# Patient Record
Sex: Female | Born: 1960
Health system: Southern US, Community
[De-identification: ages and names within clinical notes are randomized; demographics above are authoritative.]

## PROBLEM LIST (undated history)

## (undated) DIAGNOSIS — Z789 Other specified health status: Secondary | ICD-10-CM

## (undated) DIAGNOSIS — G510 Bell's palsy: Secondary | ICD-10-CM

## (undated) DIAGNOSIS — E785 Hyperlipidemia, unspecified: Secondary | ICD-10-CM

## (undated) DIAGNOSIS — E119 Type 2 diabetes mellitus without complications: Secondary | ICD-10-CM

## (undated) HISTORY — DX: Type 2 diabetes mellitus without complications: E11.9

## (undated) HISTORY — DX: Hyperlipidemia, unspecified: E78.5

---

## 1993-01-17 HISTORY — PX: TUBAL LIGATION: SHX77

## 2004-05-21 ENCOUNTER — Ambulatory Visit: Payer: Self-pay | Admitting: Internal Medicine

## 2004-06-29 ENCOUNTER — Ambulatory Visit: Payer: Self-pay | Admitting: Internal Medicine

## 2004-07-14 ENCOUNTER — Ambulatory Visit: Payer: Self-pay | Admitting: Internal Medicine

## 2004-07-15 DIAGNOSIS — E785 Hyperlipidemia, unspecified: Secondary | ICD-10-CM

## 2004-07-16 ENCOUNTER — Ambulatory Visit: Payer: Self-pay | Admitting: Internal Medicine

## 2004-08-30 ENCOUNTER — Ambulatory Visit: Payer: Self-pay | Admitting: Internal Medicine

## 2004-09-29 ENCOUNTER — Ambulatory Visit (HOSPITAL_COMMUNITY): Admission: RE | Admit: 2004-09-29 | Discharge: 2004-09-29 | Payer: Self-pay | Admitting: Internal Medicine

## 2004-09-29 ENCOUNTER — Ambulatory Visit: Payer: Self-pay | Admitting: Internal Medicine

## 2004-10-12 ENCOUNTER — Ambulatory Visit: Payer: Self-pay

## 2004-12-17 ENCOUNTER — Ambulatory Visit (HOSPITAL_COMMUNITY): Admission: RE | Admit: 2004-12-17 | Discharge: 2004-12-17 | Payer: Self-pay | Admitting: Internal Medicine

## 2004-12-17 ENCOUNTER — Ambulatory Visit: Payer: Self-pay

## 2004-12-31 ENCOUNTER — Encounter: Admission: RE | Admit: 2004-12-31 | Discharge: 2004-12-31 | Payer: Self-pay | Admitting: Internal Medicine

## 2004-12-31 ENCOUNTER — Encounter (INDEPENDENT_AMBULATORY_CARE_PROVIDER_SITE_OTHER): Payer: Self-pay | Admitting: Internal Medicine

## 2005-01-07 ENCOUNTER — Ambulatory Visit: Payer: Self-pay | Admitting: Internal Medicine

## 2005-11-04 DIAGNOSIS — K219 Gastro-esophageal reflux disease without esophagitis: Secondary | ICD-10-CM | POA: Insufficient documentation

## 2008-09-09 ENCOUNTER — Ambulatory Visit (HOSPITAL_COMMUNITY): Admission: RE | Admit: 2008-09-09 | Discharge: 2008-09-09 | Payer: Self-pay | Admitting: *Deleted

## 2009-08-03 ENCOUNTER — Ambulatory Visit: Payer: Self-pay | Admitting: Family Medicine

## 2009-08-21 ENCOUNTER — Ambulatory Visit: Payer: Self-pay | Admitting: Family Medicine

## 2009-08-21 ENCOUNTER — Encounter: Payer: Self-pay | Admitting: Family Medicine

## 2009-08-21 LAB — CONVERTED CEMR LAB
ALT: 21 units/L (ref 0–35)
AST: 19 units/L (ref 0–37)
Albumin: 4.4 g/dL (ref 3.5–5.2)
Alkaline Phosphatase: 65 units/L (ref 39–117)
BUN: 8 mg/dL (ref 6–23)
Calcium: 9.2 mg/dL (ref 8.4–10.5)
Chloride: 105 meq/L (ref 96–112)
Cholesterol, target level: 200 mg/dL
HDL goal, serum: 40 mg/dL
HDL: 43 mg/dL (ref >39)
Hemoglobin: 12.9 g/dL (ref 12.0–15.0)
LDL Cholesterol: 157 mg/dL — ABNORMAL HIGH (ref 0–99)
LDL Goal: 160 mg/dL
MCHC: 32.2 g/dL (ref 30.0–36.0)
Platelets: 286 10*3/uL (ref 150–400)
Potassium: 4 meq/L (ref 3.5–5.3)
RDW: 13.4 % (ref 11.5–15.5)
Sodium: 139 meq/L (ref 135–145)
TSH: 2.182 microintl units/mL (ref 0.350–4.500)
Total Protein: 6.6 g/dL (ref 6.0–8.3)

## 2009-08-27 ENCOUNTER — Ambulatory Visit: Payer: Self-pay | Admitting: Family Medicine

## 2009-08-27 DIAGNOSIS — IMO0001 Reserved for inherently not codable concepts without codable children: Secondary | ICD-10-CM

## 2009-09-15 ENCOUNTER — Ambulatory Visit (HOSPITAL_COMMUNITY): Admission: RE | Admit: 2009-09-15 | Discharge: 2009-09-15 | Payer: Self-pay | Admitting: Family Medicine

## 2010-02-16 NOTE — Assessment & Plan Note (Signed)
Summary: F/U VISIT/[facility]   Vital Signs:  Patient profile:   50 year old female Height:      60 inches Weight:      189.7 pounds BMI:     37.18 Temp:     98.5 degrees F oral Pulse rate:   75 / minute BP sitting:   119 / 75  (left arm) Cuff size:   regular  Vitals Entered By: Garen Grams LPN (August 27, 2009 1:37 PM) CC: f/u Is Patient Diabetic? No Pain Assessment Patient in pain? no        Primary Provider:  Priscella Mann MD  CC:  f/u.  History of Present Illness: 1. Muscle aches.  Not worse than before. 4/10 at worst. Ibuprofen helps. Diffuse pain, tingling, decreased sensation, warmth right half of her body. Comes and goes randomly. Denies dizziness, joint swelling, leg swelling. More uncomfortable when sitting still; less noticeable when moving around. Uncomfortable when sleeping sometimes but sleeping 7 hrs and feels rested in morning.   2. Hyperchoelsterolemia. Discussed lab results with patient. Only abnormality high cholesterol. Patient exercise 30 minutes daily (walking). Does not eat fried foods, sweets, or carbohydrates other than corn tortillas (3-4 each meal). Eats meat occasionally. Drinks water and sweet tea. Only eats 1 serving of fruit and vegetables a day.   Allergies: No Known Drug Allergies  Physical Exam  General:  alert, well-developed, well-nourished, and well-hydrated.   Head:  normocephalic and atraumatic.  non tender Eyes:  no papilledema. EOMI.  Mouth:  no sores, cavities, erythema Neck:  supple, full ROM, and no masses.   Lungs:  CTAB Heart:  normal rate, regular rhythm, no murmur, no gallop, and no rub.   Abdomen:  obese, NABS, soft, non-tender, non-distended Msk:  no tenderness no joint swelling, warmth, instability R side slightly warmer than left Pulses:  2+ DP pulses Extremities:  1+ pedal edema Neurologic:  cranial nerves II-XII intact, strength normal in all extremities, sensation intact to light touch, and gait normal.   Skin:   warm, dry Cervical Nodes:  no LAD Psych:  good eye contact, not anxious appearing, does not appear depressed, normally interactive, good memory   Impression & Recommendations:  Problem # 1:  FIBROMYALGIA (ICD-729.1)  Informed patient of diagnosis. Told her nothing worrisome. Encouraged her to exercise more: walk more, swim, try exercise class, dance. She said she would try.  Told her to take Tylenol as needed. She was fine with explanation and treatment plan.  She will follow-up in 2 months.   Orders: [facility]- Est Level  3 (16109)  Problem # 2:  DYSLIPIDEMIA (ICD-272.4)  Will not treat for now. Not over 160s and with no other comorbidities. Recommended eat at last 5 fruits and vegetables daily, exercise more, and to drink at least 8 glasses water daily (decrease sweet tea).   Orders: [facility]- Est Level  3 (60454)  Problem # 3:  PREVENTIVE HEALTH CARE (ICD-V70.0) Scheduled for mammogram August 20th.   Complete Medication List: 1)  Omeprazole 20 Mg Cpdr (Omeprazole) .... Take 1 cap by mouth every 12 hours   Patient Instructions: 1)  Por su dolores de cuerpo, pienso que tiene un enfermedad se llama "fibromyalgias". Trata hacer mas ejercicios y mover mas. Tome Tylenol 1-2 tabletas cada seis horas por la dolor.  2)  Por su colesterol, por favor coma mas vegetales y frutas (a menos cinco cada dia). El fibre en estos ayudan las sintomas de estrenimiento tambien. Tambien por favor beba a menos  de ocho vasos de agua cada dia.  3)  Si las sintomas hacen mas peor o tiene otras sintomas ocupadas, por favor llame la clinica. Si toda esta bien, por favor, vuelva a la [facility].  4)  Me alegro que conocerla a usted.

## 2010-02-16 NOTE — Assessment & Plan Note (Signed)
Summary: F/U/[facility](resch'd from 8/2)[facility]   History of Present Illness: Since she does not have any acute complaints, OV re-scheduled with me for next week (Thursday afternoon) when her lab results will be in. Her labs were drawn today.    Allergies: No Known Drug Allergies   Complete Medication List: 1)  Omeprazole 20 Mg Cpdr (Omeprazole) .... Take 1 cap by mouth every 12 hours  Laboratory Results   Blood Tests   Date/Time Received: August 21, 2009 1:43 AM  Date/Time Reported: August 21, 2009 12:03 PM   SED rate: 15 mm/hr  Comments: ...............test performed by......Marland KitchenBonnie A. [place], MLS (ASCP)cm      Appended Document: Orders Update    Clinical Lists Changes  Orders: Added new Service order of No Charge Patient Arrived (NCPA0) (NCPA0) - Signed

## 2010-02-16 NOTE — Assessment & Plan Note (Signed)
Summary: NP,tcb   Vital Signs:  Patient profile:   50 year old female Height:      60 inches Weight:      187.5 pounds BMI:     36.75 Temp:     97.4 degrees F oral Pulse rate:   75 / minute BP sitting:   123 / 65  (left arm) Cuff size:   regular  Vitals Entered By: Garen Grams LPN (August 03, 2009 3:22 PM) CC: New Patient Is Patient Diabetic? No Pain Assessment Patient in pain? no        CC:  New Patient.  History of Present Illness: Pain in the arms, abdomen, legs, and feet, in the joints and the muscles. The pain in the legs, in the joints behind the knees, is the worse. Started a long time ago, about 3 years ago. Came today because sometimes the pain is unbearable. The pain is worse. 5/10 pain. Sometimes constant and sometimes comes and goes.  Associated with food. Pain is better now that stopped eating fat. Eating salad also makes pain better. Sometimes takes Naproxen for pain and helps a little bit. Pain is 3/10. Pain improves with activity. Pain is worse at night and persists when waking up. Improves with hot shower. Mother had similar presentation, helped with physical therapy and medicine.  Never tested for diabetes or cholesterol.  Decreased energy.   Preventive Screening-Counseling & Management  Alcohol-Tobacco     Smoking Status: never  Allergies: No Known Drug Allergies  Past History:  Past Medical History: GERD  Past Surgical History: None.  Family History: Mom: diabetes Dad: diabetes; died from diabetes and cirrhosis. 1 sibling with diabetes.   Social History: Separated from husband. 3 children. Never smoked. Smoking Status:  never  Review of Systems       Diffuse joint and muscle pain. Weighted 178lbs before. Diarrhea sometimes. BM 5-6 times a day but normal stools. This started 1 month ago.  Denies dizziness, headaches.   Physical Exam  General:  alert and well-developed.  alert and well-developed.   Neck:  supple.   Lungs:  normal  respiratory effort.   Heart:  normal rate, regular rhythm, and no murmur.  Abdomen:  soft, non-tender, normal bowel sounds, no guarding, no rigidity, and no rebound tenderness; some tenderness to deep palpation in RLQ. Msk:  normal ROM, no joint tenderness, no joint swelling, no joint warmth, no redness over joints, no joint deformities, and no joint instability.   5+ strength arms and legs.  2+ popliteal and brachial DTRs bilaterally. Sensation intact in lower extremities bilaterally. Extremities:  No pedal edema bilaterally.   Impression & Recommendations:  Problem # 1:  OTHER MALAISE AND FATIGUE (ICD-780.79) The patient presents with many vague symptoms of diffuse joint and muscle pain, abdominal pain, and weakness. Since these problems are not debilitating and the patient has been functioning 3 years with these symptoms, I wanted her to follow-up within a week and have several lab tests drawn so I could rule out a few etiologies. I would like her to return fasting and check a lipid panel, a CMP, CBC, ESR to assess for possible rheumatoid arthritis, and a TSH.  The patient was amenable to holding off on any treatment for the pain until these lab results were back.   Future Orders: Comp Met-[facility] [id number]) ... 07/22/2010 CBC-[facility] ([id number]) ... 07/29/2010 Sed Rate (ESR)-[facility] [id number]) ... 07/22/2010 TSH-[facility] [id number]) ... 07/23/2010  Complete Medication List: 1)  Omeprazole 20 Mg Cpdr (Omeprazole) .Marland KitchenMarland KitchenMarland Kitchen  Take 1 cap by mouth every 12 hours  Other Orders: Future Orders: Lipid-[facility] ([id number]) ... 07/30/2010  Patient Instructions: 1)  Por favor hace una cita en una semana. En la proxima cita, vamos a sacar analisis de sangre para Archivist, cholesterol, y otras cosas. El dia antes de la proxima cita, por favor llame la oficina y le dice la oficina que usted viene la proxima dia. Porque la oficina necesita saber que usted tiene que Chiropractor. Y el dia de la cita, viene en ayunas.   2)  Elfredia Nevins ver si tiene diabetes, anemia, artritis, o otro problema antes de dar medicina.  3)  Llame la oficina si tiene mas preguntas o el dolor es peor.

## 2010-02-16 NOTE — Miscellaneous (Signed)
  Clinical Lists Changes       History of Present Illness: Last Pap 2011 Last mammogram 2010   Family History: Mom: diabetes Dad: diabetes; died from diabetes and cirrhosis @ age 49. 1 sibling with diabetes.   Social History: Separated from husband. 3 children; lives with one Rushie Goltz.  Works in [employer]. Likes to walk and read.  Never smoked.    History of Present Illness: Last Pap 2011 Last mammogram 2010       Allergies: No Known Drug Allergies

## 2010-02-23 ENCOUNTER — Other Ambulatory Visit: Payer: Self-pay | Admitting: Family Medicine

## 2010-02-23 ENCOUNTER — Encounter: Payer: Self-pay | Admitting: Family Medicine

## 2010-02-23 ENCOUNTER — Ambulatory Visit (INDEPENDENT_AMBULATORY_CARE_PROVIDER_SITE_OTHER): Payer: Self-pay | Admitting: Family Medicine

## 2010-02-23 DIAGNOSIS — R1011 Right upper quadrant pain: Secondary | ICD-10-CM

## 2010-02-23 LAB — CONVERTED CEMR LAB: Beta hcg, urine, semiquantitative: NEGATIVE

## 2010-02-24 ENCOUNTER — Ambulatory Visit (HOSPITAL_COMMUNITY)
Admission: RE | Admit: 2010-02-24 | Discharge: 2010-02-24 | Disposition: A | Discharge status: Home / Self Care | Payer: Self-pay | Source: Ambulatory Visit | Attending: Family Medicine | Admitting: Family Medicine

## 2010-02-24 DIAGNOSIS — K7689 Other specified diseases of liver: Secondary | ICD-10-CM | POA: Insufficient documentation

## 2010-02-24 DIAGNOSIS — R1011 Right upper quadrant pain: Secondary | ICD-10-CM | POA: Insufficient documentation

## 2010-02-24 LAB — CONVERTED CEMR LAB
ALT: 18 units/L (ref 0–35)
AST: 19 units/L (ref 0–37)
Albumin: 4.4 g/dL (ref 3.5–5.2)
Alkaline Phosphatase: 77 units/L (ref 39–117)
BUN: 11 mg/dL (ref 6–23)
Chloride: 104 meq/L (ref 96–112)
HCT: 39.8 % (ref 36.0–46.0)
Hemoglobin: 13.1 g/dL (ref 12.0–15.0)
MCHC: 32.9 g/dL (ref 30.0–36.0)
Platelets: 308 10*3/uL (ref 150–400)
Potassium: 4 meq/L (ref 3.5–5.3)
RDW: 13.4 % (ref 11.5–15.5)

## 2010-03-02 ENCOUNTER — Encounter: Payer: Self-pay | Admitting: *Deleted

## 2010-03-04 NOTE — Assessment & Plan Note (Signed)
Summary: Acute RUQ abdominal pain   Vital Signs:  Patient profile:   50 year old female Height:      60 inches Weight:      184.06 pounds BMI:     36.08 BSA:     1.80 Temp:     98.6 degrees F Pulse rate:   72 / minute BP sitting:   110 / 70  Vitals Entered By: Jone Baseman CMA (February 23, 2010 3:40 PM) CC: stomach pain x 1 day Is Patient Diabetic? No Pain Assessment Patient in pain? yes     Location: stomach Intensity: 5   Visit Type:  Acute Primary Provider:  Priscella Mann MD  CC:  stomach pain x 1 day.  History of Present Illness: 1. R-sided abdominal pain started yesterday Pain is 5/10. Comes and goes. May be associated with eating (eating may make it worse). Not associated nausea/vomiting. Pain described as sharp and sometimes tingling lasting for a few minutes. Feels pain is more superficial than deep. Wonders if could be due to muscle strain; has been going to the gym more frequently past few days. Moving a certain way may make it worse.  ROS: no dysuria/hematuria/diarrhea/constipation (bowel movement this AM but hard stools)/blood in stool/fever; denies chest pain but occasional substernal burning from heartburn  Patient takes omeprazole occasionally for GERD. Some improvement. Only takes with symptoms after eating spicy foods.   Habits & Providers  Alcohol-Tobacco-Diet     Tobacco Status: never  Current Medications (verified): 1)  Omeprazole 20 Mg Cpdr (Omeprazole) .... Take 1 Cap By Mouth Every 12 Hours 2)  Diclofenac Potassium 50 Mg Tabs (Diclofenac Potassium) .... [employer] [place]. 3)  Miralax  Powd (Polyethylene Glycol 3350) .... Take Stool Softener 1 Cap Once or Twice A Day As Needed For Constipation.  Allergies (verified): No Known Drug Allergies  Social History: Lives with husband Emilio Aspen who is also my patient.  Had 3 children; lives with one Rushie Goltz.  Works in [employer]. Likes to  walk and read.  Never smoked.   Physical Exam  General:  NAD, conversant, pleasant, tidy Lungs:  CTAB Heart:  RRR, no murmurs Abdomen:  NABS, soft, obese, moderate tenderness to deep palpation middle right abdomen; no rebound or guarding (negative Murphy's); no erythema Msk:  No CVA tenderness bilaterally Pulses:  2+ pedal pulses Extremities:  warm, dry, no edema Psych:  normally interactive, not anxious   Impression & Recommendations:  Problem # 1:  ABDOMINAL PAIN, RIGHT UPPER QUADRANT (ICD-789.01) Assessment New May be due to gallstones. Patient's demographics (female, overweight, fertile, [age]) applies. LDL @ 157 in 08/2009. Will check abdominal U/S. Normal LFTs and bili reassuring no obstruction. Will give Rx NSAID for pain, encouraged good fluids, decreasing fatty food intake. Encouraged taking omeprazole once a day to possibly help with symptoms that may be exacerbated by concomittant GERD symtpoms.  Cholecystitis unlikely considering normal WBC, normal LFTs, no fever. Will f/u U/S. No hepatitis or pancreatitis based on labs. May also be muscle strain from increasing physical activity, which should be alleviated by NSAIDs and conditioniong. Told patient would call after getting U/S results. If stones, would just manage conservatively for now with medications since patient seems to be doing okay. Patient informed of this plan and is amenable.   Orders: U Preg-[facility] [id number]) Comp Met-[facility] [id number]) CBC-[facility] [id number]) Ultrasound (Ultrasound) Lipase-[facility] ([id number]) [facility]- Est Level  3 (27253)  Complete Medication List: 1)  Omeprazole 20 Mg Cpdr (Omeprazole) .... Take 1 cap by mouth every 12 hours 2)  Diclofenac Potassium 50 Mg Tabs (Diclofenac potassium) .... Tome una cada ocho o doce horas por el dolor de [place]. 3)  Miralax Powd (Polyethylene glycol 3350) .... Take stool softener 1 cap once or twice a day as needed for constipation. Prescriptions: MIRALAX  POWD (POLYETHYLENE  GLYCOL 3350) Take stool softener 1 cap once or twice a day as needed for constipation.  #1 container x 11   Entered and Authorized by:   Priscella Mann MD   Signed by:   Lucianne Muss Park MD on 02/23/2010   Method used:   Electronically to        [facility] [phone]* (retail)       [address], [place]  [address]       Ph: [phone] or [phone]       Fax: [phone]   RxID:   [id number] DICLOFENAC POTASSIUM 50 MG TABS (DICLOFENAC POTASSIUM) Tome una cada ocho o doce horas por el dolor de [place].  #30 x 1   Entered and Authorized by:   Priscella Mann MD   Signed by:   Lucianne Muss Park MD on 02/23/2010   Method used:   Electronically to        [facility] [phone]* (retail)       [address] [place], [place]  [address]       Ph: [phone] or [phone]       Fax: [phone]   RxID:   [id number]    Orders Added: 1)  U Preg-[facility] [[id number]] 2)  Comp Met-[facility] [[id number]] 3)  CBC-[facility] [[id number]] 4)  Ultrasound [Ultrasound] 5)  Lipase-[facility] [[id number]] 6)  [facility]- Est Level  3 [60737]    Laboratory Results   Urine Tests  Date/Time Received: February 23, 2010 4:25 PM  Date/Time Reported: February 23, 2010 5:18 PM     Urine HCG: negative Comments: ...............test performed by......Marland KitchenBonnie A. [place], MLS (ASCP)cm

## 2010-08-31 ENCOUNTER — Other Ambulatory Visit (HOSPITAL_COMMUNITY): Payer: Self-pay | Admitting: Family Medicine

## 2010-09-28 ENCOUNTER — Other Ambulatory Visit: Payer: Self-pay | Admitting: Family Medicine

## 2010-09-28 DIAGNOSIS — Z1231 Encounter for screening mammogram for malignant neoplasm of breast: Secondary | ICD-10-CM

## 2010-10-06 ENCOUNTER — Ambulatory Visit (HOSPITAL_COMMUNITY)
Admission: RE | Admit: 2010-10-06 | Discharge: 2010-10-06 | Disposition: A | Discharge status: Home / Self Care | Payer: Self-pay | Source: Ambulatory Visit | Attending: Family Medicine | Admitting: Family Medicine

## 2010-10-06 DIAGNOSIS — Z1231 Encounter for screening mammogram for malignant neoplasm of breast: Secondary | ICD-10-CM

## 2011-11-18 ENCOUNTER — Other Ambulatory Visit: Payer: Self-pay | Admitting: Family Medicine

## 2011-11-18 DIAGNOSIS — Z1231 Encounter for screening mammogram for malignant neoplasm of breast: Secondary | ICD-10-CM

## 2011-12-09 ENCOUNTER — Ambulatory Visit (HOSPITAL_COMMUNITY)
Admission: RE | Admit: 2011-12-09 | Discharge: 2011-12-09 | Disposition: A | Discharge status: Home / Self Care | Payer: Self-pay | Source: Ambulatory Visit | Attending: Family Medicine | Admitting: Family Medicine

## 2011-12-09 DIAGNOSIS — Z1231 Encounter for screening mammogram for malignant neoplasm of breast: Secondary | ICD-10-CM

## 2012-04-09 ENCOUNTER — Ambulatory Visit (INDEPENDENT_AMBULATORY_CARE_PROVIDER_SITE_OTHER): Payer: Self-pay | Admitting: Family Medicine

## 2012-04-09 VITALS — BP 118/71 | HR 67 | Temp 98.4°F | Ht 60.0 in | Wt 188.0 lb

## 2012-04-09 DIAGNOSIS — S31809A Unspecified open wound of unspecified buttock, initial encounter: Secondary | ICD-10-CM

## 2012-04-09 DIAGNOSIS — S31801A Laceration without foreign body of unspecified buttock, initial encounter: Secondary | ICD-10-CM | POA: Insufficient documentation

## 2012-04-09 DIAGNOSIS — N8110 Cystocele, unspecified: Secondary | ICD-10-CM

## 2012-04-09 NOTE — Progress Notes (Signed)
  Subjective:    Patient ID: [name] [name], female    DOB: 01/25/1961, 52 y.o.   MRN: [id number]  HPI # She was seen at a clinic/urgent care about 8 days ago and diagnosed with an "ulcer" between her gluteal folds No draining was performed and she was not started on oral antibiotics She has been using antibiotic ointment in that area and feels like the ulcers are much better. ROS: denies fevers, chills, drainage  # She feels something come out in her vaginal area when bearing down She has had 3 children in the past She denies constipation and has bowel movement every 1-2 days Denies pain, abnormal vaginal discharge, dyspareunia  Review of Systems Per HPI Denies dysuria/urgency/frequency, urinary incontinence  Allergies, medication, past medical history reviewed.  Smoking status noted.     Objective:   Physical Exam GEN: NAD; overweight GLUTEAL FOLDS: abrasions between gluteal folds near anus; no surrounding erythema or drainage VAGINA: vaginal prolapse when bearing down with most distal portion about 3 cm from hymenal plane       Assessment & Plan:

## 2012-04-09 NOTE — Assessment & Plan Note (Signed)
Superficial skin tear between buttocks, several cm above anus. It appears to be improving from about a week ago and does not appear infected at this time.  -Continue antibiotic ointment for the next few days, then may use Vaseline afterwards to prevent friction tear -Avoid constipation

## 2012-04-09 NOTE — Assessment & Plan Note (Signed)
It appears to be about stage I. We discussed diagnosis and went over handout in Spanish. She denies any problems with urination, bowel movements, or sexually intercourse.  -Kegel exercises -Follow-up as needed

## 2012-04-09 NOTE — Patient Instructions (Addendum)
Para las nalgas: -Use la crema de antiobiotica que tiene or use la vaseline  Para la vagina: Prolapso  (Prolapse) Prolapso es un trmino que se refiere al desprendimiento, abultamiento, o cada de una parte del cuerpo. Los rganos que con ms frecuencia sufren prolapso son el recto, el intestino delgado, la vejiga, la uretra, la vagina (canal de parto), el tero y el cuello del tero. Un prolapso ocurre cuando los ligamentos y el tejido muscular que rodea al recto, la vejiga y el tero se daan o se debilitan. CAUSAS Ocurre especialmente con  [place]. Algunas mujeres sienten presin plvica o tienen problemas para contener la orina luego del [place], debido al estiramiento y Insurance account manager de los tejidos plvicos. Esto generalmente mejora con el tiempo y la sensacin a menudo desaparece, Leisure centre manager.  Levantar pesos excesivos de [place] habitual.  La edad.  En la menopausia, con la disminucin de la produccin de estrgenos, se debilitan los ligamentos y los msculos de la pelvis.  Cirugas anteriores de la pelvis.  Obesidad.  Constipacin crnica.  Tos crnica. Un prolapso puede afectar un nico rgano, o bien varios rganos pueden sufrir un prolapso al [employer]. La pared anterior de la vagina sostiene la vejiga. La pared posterior sostiene parte del recto. El tero ocupa un Producer, television/film/video. Todos estos rganos pueden verse involucrados cuando los ligamentos y los msculos que rodean la vagina se Gaffer. Esto a menudo empeora cuando la mujer deja de producir estrgeno (menopausia). SNTOMAS  No poder controlar la salida de orina (incontinencia) al toser, Engineering geologist, Education officer, environmental esfuerzos y ejercicio.  Necesitar realizar mucha fuerza en un movimiento intestinal debido a que la materia fecal est retenida.  Cuando una parte de un rgano sobresale a travs de la apertura de la vagina, a veces existe una sensacin de pesadez o presin. Puede sentirse como que  algo se est cayendo. Esta sensacin aumenta al toser o [employer].  Si el rgano sobresale por la apertura de la vagina y roza contra la ropa, puede producirse irritacin, lceras, infeccin, dolor y Landscape architect.  Dolor en la cintura.  Presin en la zona superior o inferior de la vagina, prdida de [place] o de materia fecal.  Problemas durante las [employer].  Imposibilidad de insertar un tampn o el aplicador. DIAGNSTICO A menudo un examen fsico es todo lo que se necesita para identificar el problema. Durante el examen se le pedir que tosa y [employer] msculos mientras est [place], sentada y parada. El Nurse, children's si se deben realizar ms exmenes, como por ejemplo, para verificar del funcionamiento de su vejiga. Algunos diagnsticos son:  Prolapso del tero: el tero cae hacia la vagina. TRATAMIENTO En la mayor parte de los casos, slo se debe tratar un prolapso si produce sntomas. Si los sntomas interfieren en sus actividades diarias o en las relaciones sexuales, ser necesario Pensions consultant. A continuacin se indican algunas medidas que puede utilizar para tratar el prolapso.  El estrgeno puede ayudar a las mujeres mayores con prolapsos leves.  Los ejercicios de [place] pueden ser tiles para solucionar casos leves de prolapso al fortalecer y tensar los msculos del piso plvico.  Se utilizan pesarios en mujeres que no pueden o eligen no realizarse [place]. Un pesario es una pieza de plstico o goma con forma de rosca que se coloca en la vagina para Pharmacologist a los Programmer, multimedia. El pesario debe ser colocado por el profesional, quien [employer]  dar las instrucciones pertinentes relacionadas con el dispositivo. Si funciona bien en usted, puede ser el nico tratamiento que se necesite.  A menudo la ciruga es la nica forma de tratamiento para prolapsos ms graves. Existen distintos tipos de [place] disponibles, y deber Lobbyist  con el profesional acerca de cul es el mejor para usted. Si el tero ha sufrido un prolapso, es posible que sea removido (histerectoma) como parte del procedimiento quirrgico. El profesional Art gallery manager acerca de los riesgos y beneficios.  Podr realizarse una suspensin utero-vaginal (ciruga para sostener los rganos), especialmente si quiere cuidar su fertilidad. Ninguna forma de tratamiento garantiza la correccin del prolapso o el alivio de los sntomas. INSTRUCCIONES PARA EL CUIDADO DOMICILIARIO:  Utilice una almohadilla sanitaria o algn producto absorbente si tiene incontinencia urinaria.  Evite levantar pesos y los deportes o trabajos extenuantes.  Tome analgsicos de venta libre para las molestias leves.  Tome estrgenos o utilcelos en forma de crema vaginal.  Haga los ejercicios de Kegel o utilice un pesario antes de decidirse por la [place].  Haga los ejercicios de Kegel despus de tener un beb. SOLICITE ATENCIN MDICA SI:  Los sntomas interfieren con sus actividades diarias.  Necesita medicamentos para [employer].  Debe colocarse un pesario.  Nota un sangrado que proviene de la vagina.  Cree que tiene [employer] cuello del tero.  La temperatura oral se eleva sin motivo por arriba de 102 F (38.9 C).  Siente dolor al Geographical information systems officer u [place] con [place].  Presenta sangrado al realizar un movimiento intestinal.  Los sntomas interfieren con su vida sexual.  Tiene incontinencia urinaria y sta interfiere en sus actividades diarias.  Pierde orina durante las [employer].  Tiene tos crnica.  Sufre estreimiento crnico.  Para los hemorroides: Hemorroides  (Hemorrhoids) Las hemorroides son venas agrandadas (dilatadas) alrededor del recto. Hay 2 tipos de hemorroides, que se determina por su ubicacin. Las hemorroides internas, que son las que se encuentran dentro del recto.Generalmente no duelen, Charity fundraiser.Sin embargo, pueden  hincharse y salir por el recto, entonces se irritan y duelen. Las hemorroides externas incluyen las venas externas del ano, y se sienten como un bulto duro y que duele, cerca del ano.Muchas veces pican, y pueden romperse y Geophysicist/field seismologist. En algunos casos se forman cogulos en las venas. Esto hace que se hinchen y duelan. Esto se suele denominar trombosis hemorroidal.  CAUSAS  Las causas de hemorroides son:   Vanetta Mulders. El embarazo aumenta la presin en las venas hemorroidales.  Constipacin.  Dificultad para mover el intestino.  Obesidad.  Levantar pesas u otras actividades que impliquen esfuerzo. INSTRUCCIONES PARA EL CUIDADO EN EL HOGAR   Agregue fibra a su dieta. Consulte con su mdico acerca del uso de suplementos con fibras.  Beba gran cantidad de lquido para mantener la orina de tono claro o color amarillo plido.  Haga ejercicios regularmente.  Vaya al bao cuando sienta la necesidad de mover el intestino. No espere.  Evite hacer fuerza al mover el intestino.  Mantenga la zona anal limpia y seca.  Solo tome medicamentos que se pueden comprar sin receta o recetados para Chief Technology Officer, Dentist o fiebre, como le indica el mdico. Si la hemorroides est trombosada:   Tome baos de asiento calientes durante 20 a 30 minutos, 3 a 4 veces por da.  Si le duele y est hinchada, coloque compresas con hielo en la zona, segn la tolerancia. Usar las compresas de [employer] baos de asiento puede ser  efectivo. Llene una bolsa plstica con hielo. Coloque una toalla entre la bolsa de hielo y la piel.  Puede usar o Contractor segn las indicaciones algunas cremas especiales y supositorios.  No utilice una almohada en forma de aro ni se siente en el inodoro durante perodos prolongados. Esto aumenta la afluencia de sangre y Chief Technology Officer. SOLICITE ATENCIN MDICA SI:   Aumenta el dolor y la hinchazn, y no puede controlarlo con Tourist information centre manager.  Tiene un sangrado que no puede parar.  No puede  mover el intestino.  Siente dolor o tiene inflamacin fuera de la zona de las hemorroides.  Tiene escalofros o una temperatura oral mayor a 102 F (38.9 C). ASEGRESE DE QUE:   Comprende estas instrucciones.  Controlar su enfermedad.  Solicitar ayuda de inmediato si no mejora o si empeora.

## 2012-04-13 ENCOUNTER — Ambulatory Visit (INDEPENDENT_AMBULATORY_CARE_PROVIDER_SITE_OTHER): Payer: Self-pay | Admitting: General Surgery

## 2012-04-18 ENCOUNTER — Ambulatory Visit (INDEPENDENT_AMBULATORY_CARE_PROVIDER_SITE_OTHER): Payer: Self-pay | Admitting: General Surgery

## 2012-04-24 ENCOUNTER — Encounter (INDEPENDENT_AMBULATORY_CARE_PROVIDER_SITE_OTHER): Payer: Self-pay | Admitting: General Surgery

## 2012-10-16 ENCOUNTER — Encounter (INDEPENDENT_AMBULATORY_CARE_PROVIDER_SITE_OTHER): Payer: Self-pay

## 2012-11-06 ENCOUNTER — Emergency Department (HOSPITAL_COMMUNITY): Payer: Self-pay

## 2012-11-06 ENCOUNTER — Observation Stay (HOSPITAL_COMMUNITY)
Admission: EM | Admit: 2012-11-06 | Discharge: 2012-11-07 | Disposition: A | Discharge status: Home / Self Care | Payer: Self-pay | Attending: Internal Medicine | Admitting: Internal Medicine

## 2012-11-06 ENCOUNTER — Encounter (HOSPITAL_COMMUNITY): Payer: Self-pay | Admitting: Emergency Medicine

## 2012-11-06 DIAGNOSIS — E785 Hyperlipidemia, unspecified: Secondary | ICD-10-CM | POA: Diagnosis present

## 2012-11-06 DIAGNOSIS — R531 Weakness: Secondary | ICD-10-CM | POA: Diagnosis present

## 2012-11-06 DIAGNOSIS — Z23 Encounter for immunization: Secondary | ICD-10-CM | POA: Insufficient documentation

## 2012-11-06 DIAGNOSIS — R29898 Other symptoms and signs involving the musculoskeletal system: Secondary | ICD-10-CM | POA: Insufficient documentation

## 2012-11-06 DIAGNOSIS — M542 Cervicalgia: Secondary | ICD-10-CM | POA: Diagnosis present

## 2012-11-06 DIAGNOSIS — IMO0001 Reserved for inherently not codable concepts without codable children: Secondary | ICD-10-CM | POA: Insufficient documentation

## 2012-11-06 DIAGNOSIS — G510 Bell's palsy: Principal | ICD-10-CM | POA: Diagnosis present

## 2012-11-06 HISTORY — DX: Other specified health status: Z78.9

## 2012-11-06 LAB — DIFFERENTIAL
Eosinophils Relative: 1 % (ref 0–5)
Lymphocytes Relative: 28 % (ref 12–46)
Lymphs Abs: 2.1 10*3/uL (ref 0.7–4.0)
Monocytes Absolute: 0.6 10*3/uL (ref 0.1–1.0)
Monocytes Relative: 9 % (ref 3–12)

## 2012-11-06 LAB — TROPONIN I: Troponin I: <0.3 ng/mL (ref <0.30)

## 2012-11-06 LAB — CBC
HCT: 40.2 % (ref 36.0–46.0)
MCH: 29.1 pg (ref 26.0–34.0)
MCV: 86.1 fL (ref 78.0–100.0)
RBC: 4.67 MIL/uL (ref 3.87–5.11)
WBC: 7.4 10*3/uL (ref 4.0–10.5)

## 2012-11-06 LAB — COMPREHENSIVE METABOLIC PANEL
ALT: 21 U/L (ref 0–35)
BUN: 15 mg/dL (ref 6–23)
CO2: 25 mEq/L (ref 19–32)
Calcium: 9.8 mg/dL (ref 8.4–10.5)
Creatinine, Ser: 0.73 mg/dL (ref 0.50–1.10)
GFR calc Af Amer: >90 mL/min (ref >90)
GFR calc non Af Amer: >90 mL/min (ref >90)
Glucose, Bld: 93 mg/dL (ref 70–99)

## 2012-11-06 LAB — URINALYSIS, ROUTINE W REFLEX MICROSCOPIC
Glucose, UA: NEGATIVE mg/dL
Hgb urine dipstick: NEGATIVE
Ketones, ur: NEGATIVE mg/dL
Leukocytes, UA: NEGATIVE
pH: 7 (ref 5.0–8.0)

## 2012-11-06 LAB — RAPID URINE DRUG SCREEN, HOSP PERFORMED
Benzodiazepines: NOT DETECTED
Cocaine: NOT DETECTED
Opiates: NOT DETECTED

## 2012-11-06 LAB — ETHANOL: Alcohol, Ethyl (B): <11 mg/dL (ref 0–11)

## 2012-11-06 MED ORDER — METOCLOPRAMIDE HCL 5 MG/ML IJ SOLN
5.0000 mg | Freq: Once | INTRAMUSCULAR | Status: AC
  Administered 2012-11-06: 5 mg via INTRAVENOUS
  Filled 2012-11-06: qty 2

## 2012-11-06 MED ORDER — DIPHENHYDRAMINE HCL 50 MG/ML IJ SOLN
25.0000 mg | Freq: Once | INTRAMUSCULAR | Status: AC
  Administered 2012-11-06: 25 mg via INTRAVENOUS
  Filled 2012-11-06: qty 1

## 2012-11-06 MED ORDER — SODIUM CHLORIDE 0.9 % IV SOLN
INTRAVENOUS | Status: AC
  Administered 2012-11-06: 23:00:00 via INTRAVENOUS

## 2012-11-06 MED ORDER — SODIUM CHLORIDE 0.9 % IV BOLUS (SEPSIS)
500.0000 mL | Freq: Once | INTRAVENOUS | Status: AC
  Administered 2012-11-06: 500 mL via INTRAVENOUS

## 2012-11-06 MED ORDER — PREDNISONE 50 MG PO TABS
60.0000 mg | ORAL_TABLET | Freq: Every day | ORAL | Status: DC
  Administered 2012-11-07: 09:00:00 60 mg via ORAL
  Filled 2012-11-06 (×2): qty 1

## 2012-11-06 MED ORDER — INFLUENZA VAC SPLIT QUAD 0.5 ML IM SUSP
0.5000 mL | INTRAMUSCULAR | Status: AC
  Administered 2012-11-07: 0.5 mL via INTRAMUSCULAR
  Filled 2012-11-06: qty 0.5

## 2012-11-06 MED ORDER — VALACYCLOVIR HCL 500 MG PO TABS
1000.0000 mg | ORAL_TABLET | Freq: Three times a day (TID) | ORAL | Status: DC
  Administered 2012-11-06 – 2012-11-07 (×2): 1000 mg via ORAL
  Filled 2012-11-06 (×4): qty 2

## 2012-11-06 MED ORDER — ENOXAPARIN SODIUM 30 MG/0.3ML ~~LOC~~ SOLN
30.0000 mg | SUBCUTANEOUS | Status: DC
  Administered 2012-11-06: 30 mg via SUBCUTANEOUS
  Filled 2012-11-06 (×2): qty 0.3

## 2012-11-06 MED ORDER — POTASSIUM CHLORIDE CRYS ER 20 MEQ PO TBCR
20.0000 meq | EXTENDED_RELEASE_TABLET | Freq: Once | ORAL | Status: AC
  Administered 2012-11-06: 20 meq via ORAL

## 2012-11-06 MED ORDER — ASPIRIN 325 MG PO TABS
325.0000 mg | ORAL_TABLET | Freq: Every day | ORAL | Status: DC
  Administered 2012-11-06 – 2012-11-07 (×2): 325 mg via ORAL
  Filled 2012-11-06 (×2): qty 1

## 2012-11-06 MED ORDER — ACETAMINOPHEN 325 MG PO TABS: 650.0000 mg | ORAL_TABLET | ORAL | Status: DC | PRN

## 2012-11-06 NOTE — ED Notes (Signed)
Report to [facility]-on way to get pt

## 2012-11-06 NOTE — ED Notes (Addendum)
Patients reports waking up with right side face, neck and upper back pain. Patient reports swellingand tenderness to right side. Pt is taking unknown medication for headache

## 2012-11-06 NOTE — H&P (Signed)
[facility] History and Physical  [name] [name] ZOX:[id number] DOB: 01/25/1961 DOA: 11/06/2012  Referring physician:  PCP: Marikay Alar, MD  Specialists:   Chief Complaint: L sided facial paralysis   HPI: [name] [name] is a 52 y.o. female with no significant medical history developed L sided unilateral facial paralysis, associated with L eye decreased  lacrimation, and intermittent L sided neck pain, tingling with radiation to L upper extremity since Friday; she also had som dizziness, denies vertigo; no other focal neurological symptoms, no changes in hearing, vision, no motor weakness in extremities;  -denies fever, chills, no headaches, no SOB, no cardiopulmonary symptoms  -ED d/w neurology who recommended to admit r/o CVA;  Review of Systems: The patient denies anorexia, fever, weight loss,, vision loss, decreased hearing, hoarseness, chest pain, syncope, dyspnea on exertion, peripheral edema, balance deficits, hemoptysis, abdominal pain, melena, hematochezia, severe indigestion/heartburn, hematuria, incontinence, genital sores, muscle weakness, suspicious skin lesions, transient blindness, difficulty walking, depression, unusual weight change, abnormal bleeding, enlarged lymph nodes, angioedema, and breast masses.    No past medical history on file. No past surgical history on file. Social History:  has no tobacco, alcohol, and drug history on file. Home:  where does patient live--home, ALF, SNF? and with whom if at home? Yes:  Can patient participate in ADLs?  No Known Allergies  No family history on file.  denies h/o CAD  (be sure to complete)  Prior to Admission medications   Medication Sig Start Date End Date Taking? Authorizing Provider  diclofenac (CATAFLAM) 50 MG tablet Tome una cada ocho o doce horas por el dolor de estomago   Yes Historical Provider, MD  naproxen (NAPROSYN) 250 MG tablet Take 375 mg by mouth 2 (two) times daily with a  meal.   Yes Historical Provider, MD  Polyethyl Glycol-Propyl Glycol 0.4-0.3 % GEL Place 2 drops into the left eye 2 (two) times daily as needed (irratiation of eyes).   Yes Historical Provider, MD   Physical Exam: Filed Vitals:   11/06/12 1626  BP:   Temp: 99 F (37.2 C)  Resp:      General:  alert  Eyes: eom-i  ENT: no oral ulcers   Neck: supple, no JVD  Cardiovascular: s1,s2 rrr  Respiratory: cta BL   Abdomen: soft, nt, nd   Skin: no rash   Musculoskeletal: no edema   Psychiatric: no hallucinations   Neurologic: L sided facial nerve paralysis, sensation intact; motor 5/5 in all extremities; DRT decrease symmetric   Labs on Admission:  Basic Metabolic Panel:  Recent Labs Lab 11/06/12 1620  NA 138  K 3.4*  CL 101  CO2 25  GLUCOSE 93  BUN 15  CREATININE 0.73  CALCIUM 9.8   Liver Function Tests:  Recent Labs Lab 11/06/12 1620  AST 21  ALT 21  ALKPHOS 104  BILITOT 0.5  PROT 7.8  ALBUMIN 4.5   No results found for this basename: LIPASE, AMYLASE,  in the last 168 hours No results found for this basename: AMMONIA,  in the last 168 hours CBC:  Recent Labs Lab 11/06/12 1620  WBC 7.4  NEUTROABS 4.6  HGB 13.6  HCT 40.2  MCV 86.1  PLT 286   Cardiac Enzymes:  Recent Labs Lab 11/06/12 1620  TROPONINI <0.30    BNP (last 3 results) No results found for this basename: PROBNP,  in the last 8760 hours CBG:  Recent Labs Lab 11/06/12 1718  GLUCAP 88    Radiological Exams on Admission: Ct  Head Wo Contrast  11/06/2012   CLINICAL DATA:  Left-sided facial droop.  EXAM: CT HEAD WITHOUT CONTRAST  TECHNIQUE: Contiguous axial images were obtained from the base of the skull through the vertex without intravenous contrast.  COMPARISON:  None.  FINDINGS: Bony calvarium appears intact. No mass effect or midline shift is noted. Ventricular size is within normal limits. There is no evidence of mass lesion, hemorrhage or acute infarction.  IMPRESSION: No  gross intracranial abnormality seen.   Electronically Signed   By: Roque Lias M.D.   On: 11/06/2012 16:44    EKG: Independently reviewed. NSR, no acute ST/T changes   Assessment/Plan Principal Problem:   Bell's palsy Active Problems:   Left-sided weakness   Neck pain  52 y.o. female with no significant medical history developed L sided unilateral facial paralysis, associated with L eye decreased lacrimation, and intermittent L sided neck pain, tingling with radiation to L upper extremity since Friday; ED d/w neurology who requested to r/o stroke    1. L sided facial paralysis likely Bell's palsy; could not explain L upper extremity symptoms/parasthesia; CT head: unremarkably; neuro exam non focal except facial paralysis;  -admit on tele; MRI head/neck; stroke work up  -start empiric prednisone/valacylovir for Bell's palsy; check HSV, Lyme, B12;   2.  Hypo K; replace PO  Neurology c/sed from ED:  if consultant consulted, please document name and whether formally or informally consulted  Code Status: full (must indicate code status--if unknown or must be presumed, indicate so) Family Communication: husband at the bedside (indicate person spoken with, if applicable, with phone number if by telephone) Disposition Plan: home likely in AM  (indicate anticipated LOS)  Time spent: >45 minutes   Esperanza Sheets [facility] Pager [phone]  If 7PM-7AM, please contact night-coverage [email or web address] Password [facility] 11/06/2012, 6:19 PM

## 2012-11-06 NOTE — ED Provider Notes (Signed)
CSN: [id number]     Arrival date & time 11/06/12  1457 History   First MD Initiated Contact with Patient 11/06/12 1505     Chief Complaint  Patient presents with  . Facial Pain   (Consider location/radiation/quality/duration/timing/severity/associated sxs/prior Treatment) HPI Comments: 52 year old female who presents with sudden onset left-sided facial numbness tingling and weakness which began 4 days ago. She notes that her symptoms have persisted. She also notes intermittent symptoms of numbness and weakness in her left arm and left leg. She notes that she has these episodes several times per day and they last usually 30 seconds at a time. She denies them on exam currently. She also states that she has had a mild occipital headache for approximately one month. She also has some left retro-orbital pain and some mild blurry vision in her left eye. She denies any peripheral field deficits. She notes that the pain is mild and aching in nature. Nothing has relieved her symptoms thus far. She has not had similar symptoms in the past. She also notes some intermittent hearing loss in her left ear and hears crackling in the left ear occasionally as well.  The history is provided by the patient. The history is limited by a language barrier. A language interpreter was used.    No past medical history on file. No past surgical history on file. No family history on file. History  Substance Use Topics  . Smoking status: Not on file  . Smokeless tobacco: Not on file  . Alcohol Use: Not on file   OB History   No data available     Review of Systems  Constitutional: Negative for fever and fatigue.  HENT: Negative for congestion and drooling.   Eyes: Positive for pain and visual disturbance.       Left eye tearing  Respiratory: Negative for cough and shortness of breath.   Cardiovascular: Negative for chest pain.  Gastrointestinal: Negative for nausea, vomiting, abdominal pain and diarrhea.   Endocrine: Negative for polyuria.  Genitourinary: Negative for dysuria and hematuria.  Musculoskeletal: Negative for back pain, gait problem and neck pain.  Skin: Negative for color change.  Neurological: Positive for dizziness, facial asymmetry, weakness, numbness and headaches.  Hematological: Negative for adenopathy.  Psychiatric/Behavioral: Negative for behavioral problems.  All other systems reviewed and are negative.    Allergies  Review of patient's allergies indicates no known allergies.  Home Medications   Current Outpatient Rx  Name  Route  Sig  Dispense  Refill  . diclofenac (CATAFLAM) 50 MG tablet      Tome una cada ocho o doce horas por el dolor de [place]         . naproxen (NAPROSYN) 250 MG tablet   Oral   Take 375 mg by mouth 2 (two) times daily with a meal.         . Polyethyl Glycol-Propyl Glycol 0.4-0.3 % GEL   Left Eye   Place 2 drops into the left eye 2 (two) times daily as needed (irratiation of eyes).          BP 129/62  Temp(Src) 98.4 F (36.9 C) (Oral)  Resp 18  SpO2 98% Physical Exam  Nursing note and vitals reviewed. Constitutional: She is oriented to person, place, and time. She appears well-developed and well-nourished.  HENT:  Head: Normocephalic.  Mouth/Throat: No oropharyngeal exudate.  Eyes: Conjunctivae and EOM are normal. Pupils are equal, round, and reactive to light.  Neck: Normal range of motion. Neck  supple.  Cardiovascular: Normal rate, regular rhythm, normal heart sounds and intact distal pulses.  Exam reveals no gallop and no friction rub.   No murmur heard. Pulmonary/Chest: Effort normal and breath sounds normal. No respiratory distress. She has no wheezes.  Abdominal: Soft. Bowel sounds are normal. There is no tenderness. There is no rebound and no guarding.  Musculoskeletal: Normal range of motion. She exhibits no edema and no tenderness.  Neurological: She is alert and oriented to person, place, and time. She has  normal strength. No sensory deficit. She displays a negative Romberg sign. Coordination and gait normal.  Normal finger to nose bilaterally. Normal strength in all extremities. 2+ distal pulses. Normal understanding and speech. Normal gait. The patient has blurred vision in her left eye.  Left sided facial droop w/ inability to furrow the left brow. Mild difficulty closing left eye lid. Altered sensation to light touch on the left side of her face diffusely.  Skin: Skin is warm and dry.  Psychiatric: She has a normal mood and affect. Her behavior is normal.    ED Course  Procedures (including critical care time) Labs Review Labs Reviewed  COMPREHENSIVE METABOLIC PANEL - Abnormal; Notable for the following:    Potassium 3.4 (*)    All other components within normal limits  ETHANOL  PROTIME-INR  APTT  CBC  DIFFERENTIAL  URINE RAPID DRUG SCREEN (HOSP PERFORMED)  URINALYSIS, ROUTINE W REFLEX MICROSCOPIC  TROPONIN I  GLUCOSE, CAPILLARY  CBC  CREATININE, SERUM  HEMOGLOBIN A1C  LIPID PANEL  B. BURGDORFI ANTIBODIES  HSV(HERPES SMPLX)ABS-I+II(IGG+IGM)-BLD  VITAMIN B12   Imaging Review Ct Head Wo Contrast  11/06/2012   CLINICAL DATA:  Left-sided facial droop.  EXAM: CT HEAD WITHOUT CONTRAST  TECHNIQUE: Contiguous axial images were obtained from the base of the skull through the vertex without intravenous contrast.  COMPARISON:  None.  FINDINGS: Bony calvarium appears intact. No mass effect or midline shift is noted. Ventricular size is within normal limits. There is no evidence of mass lesion, hemorrhage or acute infarction.  IMPRESSION: No gross intracranial abnormality seen.   Electronically Signed   By: Roque Lias M.D.   On: 11/06/2012 16:44   Mr Angiogram Neck W Wo Contrast  11/07/2012   CLINICAL DATA:  Neck pain. Left-sided facial paralysis.  EXAM: MRI HEAD WITHOUT CONTRAST  MRI CERVICAL SPINE WITHOUT CONTRAST  MRA HEAD WITHOUT CONTRAST  TECHNIQUE: Multiplanar, multiecho pulse  sequences of the brain and surrounding structures, and cervical spine, to include the craniocervical junction and cervicothoracic junction, were obtained without intravenous contrast.  COMPARISON:  None.  FINDINGS: MRI HEAD FINDINGS  The diffusion-weighted images demonstrate no evidence for acute or subacute infarction. No hemorrhage or mass lesion is present. The ventricles are of normal size. No significant extra-axial fluid collection is present. Flow is present in the major intracranial arteries. Midline structures are within normal limits. The globes and orbits are intact. Minimal mucosal thickening is present in the left maxillary sinus. The remaining paranasal sinuses are clear.  MRI CERVICAL SPINE FINDINGS  Normal signal is present in the cervical and upper thoracic spinal cord to the lowest imaged level, T2-3. The study is mildly degraded by patient motion. Flow is present in the major vascular structures of the neck. The craniocervical junction is within normal limits. No significant disc herniation or focal stenosis is evident.  MRA HEAD FINDINGS  A standard 3 vessel arch configuration is present. Both vertebral arteries originate from the subclavian arteries. The left vertebral  artery is dominant. Slight signal loss at the origin of the right vertebral artery is likely artifactual. No other significant stenoses are present.  The right common carotid artery is within normal limits. The bifurcation is unremarkable. The cervical ICA is normal.  The left common carotid artery is within normal limits. The bifurcation is unremarkable. The cervical ICA is normal.  IMPRESSION: MRI HEAD IMPRESSION  1. Normal MRI of the brain. 2. Minimal mucosal thickening in the left maxillary sinus  MRI CERVICAL SPINE IMPRESSION  1. Normal MRI of the cervical spine.  MRA HEAD IMPRESSION  1. Negative MRA of the head.   Electronically Signed   By: Gennette Pac M.D.   On: 11/07/2012 09:16   Mri Brain Without  Contrast  11/07/2012   CLINICAL DATA:  Neck pain. Left-sided facial paralysis.  EXAM: MRI HEAD WITHOUT CONTRAST  MRI CERVICAL SPINE WITHOUT CONTRAST  MRA HEAD WITHOUT CONTRAST  TECHNIQUE: Multiplanar, multiecho pulse sequences of the brain and surrounding structures, and cervical spine, to include the craniocervical junction and cervicothoracic junction, were obtained without intravenous contrast.  COMPARISON:  None.  FINDINGS: MRI HEAD FINDINGS  The diffusion-weighted images demonstrate no evidence for acute or subacute infarction. No hemorrhage or mass lesion is present. The ventricles are of normal size. No significant extra-axial fluid collection is present. Flow is present in the major intracranial arteries. Midline structures are within normal limits. The globes and orbits are intact. Minimal mucosal thickening is present in the left maxillary sinus. The remaining paranasal sinuses are clear.  MRI CERVICAL SPINE FINDINGS  Normal signal is present in the cervical and upper thoracic spinal cord to the lowest imaged level, T2-3. The study is mildly degraded by patient motion. Flow is present in the major vascular structures of the neck. The craniocervical junction is within normal limits. No significant disc herniation or focal stenosis is evident.  MRA HEAD FINDINGS  A standard 3 vessel arch configuration is present. Both vertebral arteries originate from the subclavian arteries. The left vertebral artery is dominant. Slight signal loss at the origin of the right vertebral artery is likely artifactual. No other significant stenoses are present.  The right common carotid artery is within normal limits. The bifurcation is unremarkable. The cervical ICA is normal.  The left common carotid artery is within normal limits. The bifurcation is unremarkable. The cervical ICA is normal.  IMPRESSION: MRI HEAD IMPRESSION  1. Normal MRI of the brain. 2. Minimal mucosal thickening in the left maxillary sinus  MRI CERVICAL  SPINE IMPRESSION  1. Normal MRI of the cervical spine.  MRA HEAD IMPRESSION  1. Negative MRA of the head.   Electronically Signed   By: Gennette Pac M.D.   On: 11/07/2012 09:16   Mr Cervical Spine Wo Contrast  11/07/2012   CLINICAL DATA:  Neck pain. Left-sided facial paralysis.  EXAM: MRI HEAD WITHOUT CONTRAST  MRI CERVICAL SPINE WITHOUT CONTRAST  MRA HEAD WITHOUT CONTRAST  TECHNIQUE: Multiplanar, multiecho pulse sequences of the brain and surrounding structures, and cervical spine, to include the craniocervical junction and cervicothoracic junction, were obtained without intravenous contrast.  COMPARISON:  None.  FINDINGS: MRI HEAD FINDINGS  The diffusion-weighted images demonstrate no evidence for acute or subacute infarction. No hemorrhage or mass lesion is present. The ventricles are of normal size. No significant extra-axial fluid collection is present. Flow is present in the major intracranial arteries. Midline structures are within normal limits. The globes and orbits are intact. Minimal mucosal thickening is present in  the left maxillary sinus. The remaining paranasal sinuses are clear.  MRI CERVICAL SPINE FINDINGS  Normal signal is present in the cervical and upper thoracic spinal cord to the lowest imaged level, T2-3. The study is mildly degraded by patient motion. Flow is present in the major vascular structures of the neck. The craniocervical junction is within normal limits. No significant disc herniation or focal stenosis is evident.  MRA HEAD FINDINGS  A standard 3 vessel arch configuration is present. Both vertebral arteries originate from the subclavian arteries. The left vertebral artery is dominant. Slight signal loss at the origin of the right vertebral artery is likely artifactual. No other significant stenoses are present.  The right common carotid artery is within normal limits. The bifurcation is unremarkable. The cervical ICA is normal.  The left common carotid artery is within normal  limits. The bifurcation is unremarkable. The cervical ICA is normal.  IMPRESSION: MRI HEAD IMPRESSION  1. Normal MRI of the brain. 2. Minimal mucosal thickening in the left maxillary sinus  MRI CERVICAL SPINE IMPRESSION  1. Normal MRI of the cervical spine.  MRA HEAD IMPRESSION  1. Negative MRA of the head.   Electronically Signed   By: Gennette Pac M.D.   On: 11/07/2012 09:16    EKG Interpretation     Ventricular Rate:  76 PR Interval:  126 QRS Duration: 80 QT Interval:  401 QTC Calculation: 451 R Axis:   66 Text Interpretation:  Normal sinus rhythm No significant change was found            MDM   1. Bell's palsy   2. Left-sided weakness   3. Neck pain    Sx c/w bell's palsy as patient is unable to furrow her left brow and forehead. However, the patient is also having intermittent episodes of left-sided weakness and numbness in her left upper and lower extremities. She denies any weakness or numbness in her left upper lower extremities on exam now. Will workup as a TIA and get screening labwork.  Discussed case w/ on-call Neurologist. Will admit to hospitalist.     Junius Argyle, MD 11/07/12 1123

## 2012-11-06 NOTE — ED Notes (Signed)
Pt refused by [facility] at [facility].  Hollie Salk reports they due not take stroke work- up pts.

## 2012-11-06 NOTE — Progress Notes (Signed)
[facility] provided pt with a [facility] [employer] application.  °

## 2012-11-07 ENCOUNTER — Observation Stay (HOSPITAL_COMMUNITY): Payer: Self-pay

## 2012-11-07 DIAGNOSIS — I517 Cardiomegaly: Secondary | ICD-10-CM

## 2012-11-07 DIAGNOSIS — M6281 Muscle weakness (generalized): Secondary | ICD-10-CM

## 2012-11-07 DIAGNOSIS — G510 Bell's palsy: Secondary | ICD-10-CM

## 2012-11-07 DIAGNOSIS — E785 Hyperlipidemia, unspecified: Secondary | ICD-10-CM

## 2012-11-07 LAB — CBC
HCT: 36.9 % (ref 36.0–46.0)
Hemoglobin: 12.6 g/dL (ref 12.0–15.0)
MCH: 29.8 pg (ref 26.0–34.0)
MCHC: 34.1 g/dL (ref 30.0–36.0)
MCV: 87.2 fL (ref 78.0–100.0)
Platelets: 254 10*3/uL (ref 150–400)
RBC: 4.23 MIL/uL (ref 3.87–5.11)
RDW: 13.4 % (ref 11.5–15.5)
WBC: 6.9 10*3/uL (ref 4.0–10.5)

## 2012-11-07 LAB — CREATININE, SERUM
Creatinine, Ser: 0.57 mg/dL (ref 0.50–1.10)
GFR calc Af Amer: >90 mL/min (ref >90)
GFR calc non Af Amer: >90 mL/min (ref >90)

## 2012-11-07 LAB — LIPID PANEL
Cholesterol: 255 mg/dL — ABNORMAL HIGH (ref 0–200)
HDL: 47 mg/dL (ref >39)
LDL Cholesterol: 167 mg/dL — ABNORMAL HIGH (ref 0–99)
Total CHOL/HDL Ratio: 5.4 RATIO
Triglycerides: 206 mg/dL — ABNORMAL HIGH (ref <150)
VLDL: 41 mg/dL — ABNORMAL HIGH (ref 0–40)

## 2012-11-07 LAB — HEMOGLOBIN A1C
Hgb A1c MFr Bld: 6.4 % — ABNORMAL HIGH (ref <5.7)
Mean Plasma Glucose: 137 mg/dL — ABNORMAL HIGH (ref <117)

## 2012-11-07 LAB — VITAMIN B12: Vitamin B-12: 1446 pg/mL — ABNORMAL HIGH (ref 211–911)

## 2012-11-07 MED ORDER — ASPIRIN 325 MG PO TABS: 325.0000 mg | ORAL_TABLET | Freq: Every day | ORAL | Status: AC

## 2012-11-07 MED ORDER — GADOBENATE DIMEGLUMINE 529 MG/ML IV SOLN
20.0000 mL | Freq: Once | INTRAVENOUS | Status: AC
  Administered 2012-11-07: 18 mL via INTRAVENOUS

## 2012-11-07 NOTE — Progress Notes (Signed)
  Echocardiogram 2D Echocardiogram has been performed.  Jorje Guild 11/07/2012, 9:41 AM

## 2012-11-07 NOTE — Progress Notes (Signed)
Gave pt education and paper work to the pt. Checked with Dr.Woods when she can get back to work, said by tomorrow. Informed this to the patient. No other concerns, pt is ready to d/c. [name] [name]

## 2012-11-07 NOTE — Progress Notes (Signed)
Pt arrived from [facility] with TIA by [facility],denies any discomfort,pt settled in bed with husband at bedside,Dr Amada Jupiter notified of pt's arrival,call light within pt's reach,will continue to monitor. Obasogie-Asidi, Casin Federici Efe

## 2012-11-07 NOTE — Discharge Summary (Signed)
Physician Discharge Summary  [name] [name]:[id number] DOB: 01/25/1961 DOA: 11/06/2012  PCP: Marikay Alar, MD  Admit date: 11/06/2012 Discharge date: 11/07/2012  Time spent: 40 minutes  Recommendations for Outpatient Follow-up:  1. Bell's palsy; patient incompetent patient and husband has been counseled that tincture of time is the treatment. On presentation patient was outside the window for the use of steroids for treatment.   2. Fibromyalgia; patient to return to PCP for treatment  3. Dyslipidemia; currently not on any medication, and no recent labs for her cholesterol level. PCP to manage     Discharge Diagnoses:  Principal Problem:   Bell's palsy Active Problems:   Left-sided weakness   Neck pain   Discharge Condition: Stable  Diet recommendation: Heart healthy  Filed Weights   11/06/12 2144  Weight: 85.9 kg (189 lb 6 oz)    History of present illness:  [name] [name] 52 y.o. HF PMHx  dyslipidemia, fibromyalgia, GERD.  Patient  developed Lt sided unilateral facial paralysis, associated with Lt eye decreased lacrimation, and intermittent Lt sided neck pain, tingling with radiation to Lt upper extremity since Friday; she also had some dizziness, denies vertigo; no other focal neurological symptoms, no changes in hearing, vision, no motor weakness in extremities; denies fever, chills, no headaches, no SOB, no cardiopulmonary symptoms. Admitted for R/O CVA. CT of the head, and MRI/MRA of the brain were both negative for acute CVA. TODAY patient continues to have left facial droop, states negative pain in the face, neck, shoulder. Negative SOB, negative chest pain.     Procedures:  MRI/MRA brain without contrast, MRI cervical spine 11/07/2012 1. Normal MRI of the brain.  2. Minimal mucosal thickening in the left maxillary sinus   MRI CERVICAL SPINE IMPRESSION  1. Normal MRI of the cervical spine.   MRA HEAD IMPRESSION  1. Negative MRA of  the head.  CT head without contrast 11/06/2012 Bony calvarium appears intact. No mass effect or midline shift is  noted. Ventricular size is within normal limits. There is no  evidence of mass lesion, hemorrhage or acute infarction.   Consultations:  Neurology  Discharge Exam: Filed Vitals:   11/07/12 0200 11/07/12 0400 11/07/12 0600 11/07/12 1015  BP: 113/68 90/58 101/68 131/58  Pulse: 70 66 69 71  Temp: 97.8 F (36.6 C) 97.9 F (36.6 C) 98.4 F (36.9 C) 98 F (36.7 C)  TempSrc: Oral Oral Oral Oral  Resp: 18 18 18 18   Height:      Weight:      SpO2: 98% 99% 97% 98%    General: A./O. x4, NAD Cardiovascular: Regular in rate, negative murmurs rubs or gallops, DP/PT pulse 2+ bilateral Respiratory: Clear to auscultation bilateral Neurologic; left-sided facial droop, left-sided ptosis, tongue/uvula midline, bilateral upper extremity/bilateral lower extremity strength 5/5, sensation intact except along left face/neck.  Discharge Instructions   Future Appointments Provider Department Dept Phone   11/21/2012 8:45 AM Glori Luis, MD [facility] [phone]       Medication List    ASK your doctor about these medications       diclofenac 50 MG tablet  Commonly known as:  CATAFLAM  Tome una cada ocho o doce horas por el dolor de estomago     naproxen 250 MG tablet  Commonly known as:  NAPROSYN  Take 375 mg by mouth 2 (two) times daily with a meal.     Polyethyl Glycol-Propyl Glycol 0.4-0.3 % Gel  Place 2 drops into the left  eye 2 (two) times daily as needed (irratiation of eyes).       No Known Allergies    The results of significant diagnostics from this hospitalization (including imaging, microbiology, ancillary and laboratory) are listed below for reference.    Significant Diagnostic Studies: Ct Head Wo Contrast  11/06/2012   CLINICAL DATA:  Left-sided facial droop.  EXAM: CT HEAD WITHOUT CONTRAST  TECHNIQUE: Contiguous axial  images were obtained from the base of the skull through the vertex without intravenous contrast.  COMPARISON:  None.  FINDINGS: Bony calvarium appears intact. No mass effect or midline shift is noted. Ventricular size is within normal limits. There is no evidence of mass lesion, hemorrhage or acute infarction.  IMPRESSION: No gross intracranial abnormality seen.   Electronically Signed   By: Roque Lias M.D.   On: 11/06/2012 16:44   Mr Angiogram Neck W Wo Contrast  11/07/2012   CLINICAL DATA:  Neck pain. Left-sided facial paralysis.  EXAM: MRI HEAD WITHOUT CONTRAST  MRI CERVICAL SPINE WITHOUT CONTRAST  MRA HEAD WITHOUT CONTRAST  TECHNIQUE: Multiplanar, multiecho pulse sequences of the brain and surrounding structures, and cervical spine, to include the craniocervical junction and cervicothoracic junction, were obtained without intravenous contrast.  COMPARISON:  None.  FINDINGS: MRI HEAD FINDINGS  The diffusion-weighted images demonstrate no evidence for acute or subacute infarction. No hemorrhage or mass lesion is present. The ventricles are of normal size. No significant extra-axial fluid collection is present. Flow is present in the major intracranial arteries. Midline structures are within normal limits. The globes and orbits are intact. Minimal mucosal thickening is present in the left maxillary sinus. The remaining paranasal sinuses are clear.  MRI CERVICAL SPINE FINDINGS  Normal signal is present in the cervical and upper thoracic spinal cord to the lowest imaged level, T2-3. The study is mildly degraded by patient motion. Flow is present in the major vascular structures of the neck. The craniocervical junction is within normal limits. No significant disc herniation or focal stenosis is evident.  MRA HEAD FINDINGS  A standard 3 vessel arch configuration is present. Both vertebral arteries originate from the subclavian arteries. The left vertebral artery is dominant. Slight signal loss at the origin of  the right vertebral artery is likely artifactual. No other significant stenoses are present.  The right common carotid artery is within normal limits. The bifurcation is unremarkable. The cervical ICA is normal.  The left common carotid artery is within normal limits. The bifurcation is unremarkable. The cervical ICA is normal.  IMPRESSION: MRI HEAD IMPRESSION  1. Normal MRI of the brain. 2. Minimal mucosal thickening in the left maxillary sinus  MRI CERVICAL SPINE IMPRESSION  1. Normal MRI of the cervical spine.  MRA HEAD IMPRESSION  1. Negative MRA of the head.   Electronically Signed   By: Gennette Pac M.D.   On: 11/07/2012 09:16   Mri Brain Without Contrast  11/07/2012   CLINICAL DATA:  Neck pain. Left-sided facial paralysis.  EXAM: MRI HEAD WITHOUT CONTRAST  MRI CERVICAL SPINE WITHOUT CONTRAST  MRA HEAD WITHOUT CONTRAST  TECHNIQUE: Multiplanar, multiecho pulse sequences of the brain and surrounding structures, and cervical spine, to include the craniocervical junction and cervicothoracic junction, were obtained without intravenous contrast.  COMPARISON:  None.  FINDINGS: MRI HEAD FINDINGS  The diffusion-weighted images demonstrate no evidence for acute or subacute infarction. No hemorrhage or mass lesion is present. The ventricles are of normal size. No significant extra-axial fluid collection is present. Flow is present  in the major intracranial arteries. Midline structures are within normal limits. The globes and orbits are intact. Minimal mucosal thickening is present in the left maxillary sinus. The remaining paranasal sinuses are clear.  MRI CERVICAL SPINE FINDINGS  Normal signal is present in the cervical and upper thoracic spinal cord to the lowest imaged level, T2-3. The study is mildly degraded by patient motion. Flow is present in the major vascular structures of the neck. The craniocervical junction is within normal limits. No significant disc herniation or focal stenosis is evident.  MRA HEAD  FINDINGS  A standard 3 vessel arch configuration is present. Both vertebral arteries originate from the subclavian arteries. The left vertebral artery is dominant. Slight signal loss at the origin of the right vertebral artery is likely artifactual. No other significant stenoses are present.  The right common carotid artery is within normal limits. The bifurcation is unremarkable. The cervical ICA is normal.  The left common carotid artery is within normal limits. The bifurcation is unremarkable. The cervical ICA is normal.  IMPRESSION: MRI HEAD IMPRESSION  1. Normal MRI of the brain. 2. Minimal mucosal thickening in the left maxillary sinus  MRI CERVICAL SPINE IMPRESSION  1. Normal MRI of the cervical spine.  MRA HEAD IMPRESSION  1. Negative MRA of the head.   Electronically Signed   By: Gennette Pac M.D.   On: 11/07/2012 09:16   Mr Cervical Spine Wo Contrast  11/07/2012   CLINICAL DATA:  Neck pain. Left-sided facial paralysis.  EXAM: MRI HEAD WITHOUT CONTRAST  MRI CERVICAL SPINE WITHOUT CONTRAST  MRA HEAD WITHOUT CONTRAST  TECHNIQUE: Multiplanar, multiecho pulse sequences of the brain and surrounding structures, and cervical spine, to include the craniocervical junction and cervicothoracic junction, were obtained without intravenous contrast.  COMPARISON:  None.  FINDINGS: MRI HEAD FINDINGS  The diffusion-weighted images demonstrate no evidence for acute or subacute infarction. No hemorrhage or mass lesion is present. The ventricles are of normal size. No significant extra-axial fluid collection is present. Flow is present in the major intracranial arteries. Midline structures are within normal limits. The globes and orbits are intact. Minimal mucosal thickening is present in the left maxillary sinus. The remaining paranasal sinuses are clear.  MRI CERVICAL SPINE FINDINGS  Normal signal is present in the cervical and upper thoracic spinal cord to the lowest imaged level, T2-3. The study is mildly degraded by  patient motion. Flow is present in the major vascular structures of the neck. The craniocervical junction is within normal limits. No significant disc herniation or focal stenosis is evident.  MRA HEAD FINDINGS  A standard 3 vessel arch configuration is present. Both vertebral arteries originate from the subclavian arteries. The left vertebral artery is dominant. Slight signal loss at the origin of the right vertebral artery is likely artifactual. No other significant stenoses are present.  The right common carotid artery is within normal limits. The bifurcation is unremarkable. The cervical ICA is normal.  The left common carotid artery is within normal limits. The bifurcation is unremarkable. The cervical ICA is normal.  IMPRESSION: MRI HEAD IMPRESSION  1. Normal MRI of the brain. 2. Minimal mucosal thickening in the left maxillary sinus  MRI CERVICAL SPINE IMPRESSION  1. Normal MRI of the cervical spine.  MRA HEAD IMPRESSION  1. Negative MRA of the head.   Electronically Signed   By: Gennette Pac M.D.   On: 11/07/2012 09:16    Microbiology: No results found for this or any previous visit (from  the past 240 hour(s)).   Labs: Basic Metabolic Panel:  Recent Labs Lab 11/06/12 1620 11/06/12 2355  NA 138  --   K 3.4*  --   CL 101  --   CO2 25  --   GLUCOSE 93  --   BUN 15  --   CREATININE 0.73 0.57  CALCIUM 9.8  --    Liver Function Tests:  Recent Labs Lab 11/06/12 1620  AST 21  ALT 21  ALKPHOS 104  BILITOT 0.5  PROT 7.8  ALBUMIN 4.5   No results found for this basename: LIPASE, AMYLASE,  in the last 168 hours No results found for this basename: AMMONIA,  in the last 168 hours CBC:  Recent Labs Lab 11/06/12 1620 11/06/12 2355  WBC 7.4 6.9  NEUTROABS 4.6  --   HGB 13.6 12.6  HCT 40.2 36.9  MCV 86.1 87.2  PLT 286 254   Cardiac Enzymes:  Recent Labs Lab 11/06/12 1620  TROPONINI <0.30   BNP: BNP (last 3 results) No results found for this basename: PROBNP,  in the  last 8760 hours CBG:  Recent Labs Lab 11/06/12 1718  GLUCAP 88       Signed:  Carolyne Littles, MD [facility] [phone] pager

## 2012-11-07 NOTE — Consult Note (Signed)
Neurology Consultation Reason for Consult: Transient left-sided weakness and numbness Referring Physician: Carolyne Littles  CC: Facial weakness  History is obtained from: Patient, husband  HPI: [name] [name] is a 52 y.o. female with no significant past medical history who presents with left facial weakness as well as left-sided intermittent weakness and numbness of the arm and leg. She states that on Friday, she began having neck pain and if she stays in a single position, particularly on her side, for nearly that time her left side including her arm and leg go numb and weak. Subsequently that day, she began noticing her left face to be weak and that progressed in the reason she is seeking medical therapy at this time.  She also notes hearing changes on the left side.   LKW: Friday tpa given: no, outside of window NIH stroke scale: 2   ROS: A 14 point ROS was performed and is negative except as noted in the HPI.  Past medical history: None  Family History: No history of stroke  Social History: Tob: Denies  Exam: Current vital signs: BP 118/83  Pulse 67  Temp(Src) 98.3 F (36.8 C) (Oral)  Resp 18  Ht 5\' 1"  (1.549 m)  Wt 85.9 kg (189 lb 6 oz)  BMI 35.8 kg/m2  SpO2 97% Vital signs in last 24 hours: Temp:  [97.6 F (36.4 C)-99.4 F (37.4 C)] 98.3 F (36.8 C) (10/22 0000) Pulse Rate:  [64-72] 67 (10/22 0000) Resp:  [18-19] 18 (10/22 0000) BP: (101-131)/(53-83) 118/83 mmHg (10/22 0000) SpO2:  [95 %-100 %] 97 % (10/22 0000) Weight:  [85.9 kg (189 lb 6 oz)] 85.9 kg (189 lb 6 oz) (10/21 2144)  General: in bed, NAD CV: RRR Mental Status: Patient is awake, alert, oriented to person, place, month, year, and situation. Immediate and remote memory are intact. Patient is able to give a clear and coherent history. No signs of aphasia or neglect Cranial Nerves: II: Visual Fields are full. Pupils are equal, round, and reactive to light.  Discs are difficult to  visualize. III,IV, VI: EOMI without ptosis or diploplia.  V: Facial sensation is symmetric to temperature VII: Facial movement is notable for upper and lower facial weakness on the left.  VIII: hearing is intact to voice X: Uvula elevates symmetrically XI: Shoulder shrug is symmetric. XII: tongue is midline without atrophy or fasciculations.  Motor: Tone is normal. Bulk is normal. 5/5 strength was present in all four extremities.  Sensory: Sensation is symmetric to light touch and temperature in the arms and legs. Deep Tendon Reflexes: 2+ and symmetric in the biceps and patellae.  Plantars: Toes are downgoing bilaterally.  Cerebellar: FNF and HKS are intact bilaterally Gait: Patient has a stable casual gait. Able to tandem, heel, and toe walk.   I have reviewed labs in epic and the results pertinent to this consultation are: CMP - mild hypokalemia  I have reviewed the images obtained:CT head - negative  Impression: 52 yo F with left facial weakness most consistent with bell's palsy. The fact hearing is involved would argue for a peripheral nature. The neck pain and left-sided numbness and weakness that is positional in nature is concerning for C-spine pathology. Another possibility would be dissection causing an intra-axial infranuclear seventh nerve palsy, with positional ischemia. I feel that this is a less likely diagnosis.  Recommendations: 1) MRI brain, MRI C-spine, MRA neck 2) further recommendations following these tests.   Ritta Slot, MD [facility] [phone]  If 7pm- 7am,  please page neurology on call at [phone].

## 2012-11-07 NOTE — Progress Notes (Signed)
Subjective: Patient continues to have left facial droop.  At this time with no complaint of left sided numbness or weakness.    Objective: Current vital signs: BP 131/58  Pulse 71  Temp(Src) 98 F (36.7 C) (Oral)  Resp 18  Ht 5\' 1"  (1.549 m)  Wt 85.9 kg (189 lb 6 oz)  BMI 35.8 kg/m2  SpO2 98% Vital signs in last 24 hours: Temp:  [97.6 F (36.4 C)-99.4 F (37.4 C)] 98 F (36.7 C) (10/22 1015) Pulse Rate:  [64-72] 71 (10/22 1015) Resp:  [18-19] 18 (10/22 1015) BP: (90-131)/(53-83) 131/58 mmHg (10/22 1015) SpO2:  [95 %-100 %] 98 % (10/22 1015) Weight:  [85.9 kg (189 lb 6 oz)] 85.9 kg (189 lb 6 oz) (10/21 2144)  Intake/Output from previous day: 10/21 0701 - 10/22 0700 In: 240 [P.O.:240] Out: -  Intake/Output this shift:   Nutritional status: General  Neurologic Exam: Mental Status:  Patient is awake, alert and oriented.  Speech is fluent.  Able to follow commands  Cranial Nerves:  II: Visual Fields are full. Pupils are equal, round, and reactive to light. Discs are difficult to visualize.  III,IV, VI: EOMI without ptosis or diploplia.  V: Facial sensation is symmetric to temperature  VII: Facial movement is notable for upper and lower facial weakness on the left.  VIII: hearing is intact to voice  X: Uvula elevates symmetrically  XI: Shoulder shrug is symmetric.  XII: tongue is midline  Motor:  5/5 throughout Sensory:  Sensation is symmetric to light touch and temperature in the arms and legs.  Deep Tendon Reflexes:  2+ and symmetric in the biceps and patellae.  Plantars:  Toes are downgoing bilaterally.  Cerebellar:  FNF and HKS are intact bilaterally   Lab Results: Basic Metabolic Panel:  Recent Labs Lab 11/06/12 1620 11/06/12 2355  NA 138  --   K 3.4*  --   CL 101  --   CO2 25  --   GLUCOSE 93  --   BUN 15  --   CREATININE 0.73 0.57  CALCIUM 9.8  --     Liver Function Tests:  Recent Labs Lab 11/06/12 1620  AST 21  ALT 21  ALKPHOS 104   BILITOT 0.5  PROT 7.8  ALBUMIN 4.5   No results found for this basename: LIPASE, AMYLASE,  in the last 168 hours No results found for this basename: AMMONIA,  in the last 168 hours  CBC:  Recent Labs Lab 11/06/12 1620 11/06/12 2355  WBC 7.4 6.9  NEUTROABS 4.6  --   HGB 13.6 12.6  HCT 40.2 36.9  MCV 86.1 87.2  PLT 286 254    Cardiac Enzymes:  Recent Labs Lab 11/06/12 1620  TROPONINI <0.30    Lipid Panel:  Recent Labs Lab 11/07/12 1102  CHOL 255*  TRIG 206*  HDL 47  CHOLHDL 5.4  VLDL 41*  LDLCALC 167*    CBG:  Recent Labs Lab 11/06/12 1718  GLUCAP 88    Microbiology: No results found for this or any previous visit.  Coagulation Studies:  Recent Labs  11/06/12 1620  LABPROT 12.4  INR 0.94    Imaging: Ct Head Wo Contrast  11/06/2012   CLINICAL DATA:  Left-sided facial droop.  EXAM: CT HEAD WITHOUT CONTRAST  TECHNIQUE: Contiguous axial images were obtained from the base of the skull through the vertex without intravenous contrast.  COMPARISON:  None.  FINDINGS: Bony calvarium appears intact. No mass effect or midline shift is  noted. Ventricular size is within normal limits. There is no evidence of mass lesion, hemorrhage or acute infarction.  IMPRESSION: No gross intracranial abnormality seen.   Electronically Signed   By: Roque Lias M.D.   On: 11/06/2012 16:44   Mr Angiogram Neck W Wo Contrast  11/07/2012   CLINICAL DATA:  Neck pain. Left-sided facial paralysis.  EXAM: MRI HEAD WITHOUT CONTRAST  MRI CERVICAL SPINE WITHOUT CONTRAST  MRA HEAD WITHOUT CONTRAST  TECHNIQUE: Multiplanar, multiecho pulse sequences of the brain and surrounding structures, and cervical spine, to include the craniocervical junction and cervicothoracic junction, were obtained without intravenous contrast.  COMPARISON:  None.  FINDINGS: MRI HEAD FINDINGS  The diffusion-weighted images demonstrate no evidence for acute or subacute infarction. No hemorrhage or mass lesion is  present. The ventricles are of normal size. No significant extra-axial fluid collection is present. Flow is present in the major intracranial arteries. Midline structures are within normal limits. The globes and orbits are intact. Minimal mucosal thickening is present in the left maxillary sinus. The remaining paranasal sinuses are clear.  MRI CERVICAL SPINE FINDINGS  Normal signal is present in the cervical and upper thoracic spinal cord to the lowest imaged level, T2-3. The study is mildly degraded by patient motion. Flow is present in the major vascular structures of the neck. The craniocervical junction is within normal limits. No significant disc herniation or focal stenosis is evident.  MRA HEAD FINDINGS  A standard 3 vessel arch configuration is present. Both vertebral arteries originate from the subclavian arteries. The left vertebral artery is dominant. Slight signal loss at the origin of the right vertebral artery is likely artifactual. No other significant stenoses are present.  The right common carotid artery is within normal limits. The bifurcation is unremarkable. The cervical ICA is normal.  The left common carotid artery is within normal limits. The bifurcation is unremarkable. The cervical ICA is normal.  IMPRESSION: MRI HEAD IMPRESSION  1. Normal MRI of the brain. 2. Minimal mucosal thickening in the left maxillary sinus  MRI CERVICAL SPINE IMPRESSION  1. Normal MRI of the cervical spine.  MRA HEAD IMPRESSION  1. Negative MRA of the head.   Electronically Signed   By: Gennette Pac M.D.   On: 11/07/2012 09:16   Mri Brain Without Contrast  11/07/2012   CLINICAL DATA:  Neck pain. Left-sided facial paralysis.  EXAM: MRI HEAD WITHOUT CONTRAST  MRI CERVICAL SPINE WITHOUT CONTRAST  MRA HEAD WITHOUT CONTRAST  TECHNIQUE: Multiplanar, multiecho pulse sequences of the brain and surrounding structures, and cervical spine, to include the craniocervical junction and cervicothoracic junction, were obtained  without intravenous contrast.  COMPARISON:  None.  FINDINGS: MRI HEAD FINDINGS  The diffusion-weighted images demonstrate no evidence for acute or subacute infarction. No hemorrhage or mass lesion is present. The ventricles are of normal size. No significant extra-axial fluid collection is present. Flow is present in the major intracranial arteries. Midline structures are within normal limits. The globes and orbits are intact. Minimal mucosal thickening is present in the left maxillary sinus. The remaining paranasal sinuses are clear.  MRI CERVICAL SPINE FINDINGS  Normal signal is present in the cervical and upper thoracic spinal cord to the lowest imaged level, T2-3. The study is mildly degraded by patient motion. Flow is present in the major vascular structures of the neck. The craniocervical junction is within normal limits. No significant disc herniation or focal stenosis is evident.  MRA HEAD FINDINGS  A standard 3 vessel arch configuration  is present. Both vertebral arteries originate from the subclavian arteries. The left vertebral artery is dominant. Slight signal loss at the origin of the right vertebral artery is likely artifactual. No other significant stenoses are present.  The right common carotid artery is within normal limits. The bifurcation is unremarkable. The cervical ICA is normal.  The left common carotid artery is within normal limits. The bifurcation is unremarkable. The cervical ICA is normal.  IMPRESSION: MRI HEAD IMPRESSION  1. Normal MRI of the brain. 2. Minimal mucosal thickening in the left maxillary sinus  MRI CERVICAL SPINE IMPRESSION  1. Normal MRI of the cervical spine.  MRA HEAD IMPRESSION  1. Negative MRA of the head.   Electronically Signed   By: Gennette Pac M.D.   On: 11/07/2012 09:16   Mr Cervical Spine Wo Contrast  11/07/2012   CLINICAL DATA:  Neck pain. Left-sided facial paralysis.  EXAM: MRI HEAD WITHOUT CONTRAST  MRI CERVICAL SPINE WITHOUT CONTRAST  MRA HEAD WITHOUT  CONTRAST  TECHNIQUE: Multiplanar, multiecho pulse sequences of the brain and surrounding structures, and cervical spine, to include the craniocervical junction and cervicothoracic junction, were obtained without intravenous contrast.  COMPARISON:  None.  FINDINGS: MRI HEAD FINDINGS  The diffusion-weighted images demonstrate no evidence for acute or subacute infarction. No hemorrhage or mass lesion is present. The ventricles are of normal size. No significant extra-axial fluid collection is present. Flow is present in the major intracranial arteries. Midline structures are within normal limits. The globes and orbits are intact. Minimal mucosal thickening is present in the left maxillary sinus. The remaining paranasal sinuses are clear.  MRI CERVICAL SPINE FINDINGS  Normal signal is present in the cervical and upper thoracic spinal cord to the lowest imaged level, T2-3. The study is mildly degraded by patient motion. Flow is present in the major vascular structures of the neck. The craniocervical junction is within normal limits. No significant disc herniation or focal stenosis is evident.  MRA HEAD FINDINGS  A standard 3 vessel arch configuration is present. Both vertebral arteries originate from the subclavian arteries. The left vertebral artery is dominant. Slight signal loss at the origin of the right vertebral artery is likely artifactual. No other significant stenoses are present.  The right common carotid artery is within normal limits. The bifurcation is unremarkable. The cervical ICA is normal.  The left common carotid artery is within normal limits. The bifurcation is unremarkable. The cervical ICA is normal.  IMPRESSION: MRI HEAD IMPRESSION  1. Normal MRI of the brain. 2. Minimal mucosal thickening in the left maxillary sinus  MRI CERVICAL SPINE IMPRESSION  1. Normal MRI of the cervical spine.  MRA HEAD IMPRESSION  1. Negative MRA of the head.   Electronically Signed   By: Gennette Pac M.D.   On:  11/07/2012 09:16    Medications:  I have reviewed the patient's current medications. Scheduled: . aspirin  325 mg Oral Daily  . enoxaparin (LOVENOX) injection  30 mg Subcutaneous Q24H  . predniSONE  60 mg Oral Q breakfast  . valACYclovir  1,000 mg Oral TID    Assessment/Plan: 52 year old female with facial droop and complaints of intermittent left sided numbness and weakness.  MRI and MRA of the brain have been reviewed and are unremarkable.  MRI of the cervical spine has been reviewed as well and is unremarkable.  Extremity symptoms may very well be related to her fibromyalgia but no evidence of CNS etiology.  Bell's palsy now present for  almost a week and patient not a candidate for antivirals and steroids.    Recommendations: 1.  Agree with discharge.  May follow up with PCP as an outpatient.     LOS: 1 day   Thana Farr, MD [facility] [phone] 11/07/2012  1:07 PM

## 2012-11-08 LAB — HSV(HERPES SMPLX)ABS-I+II(IGG+IGM)-BLD
HSV 1 Glycoprotein G Ab, IgG: 3.71 IV — ABNORMAL HIGH
HSV 2 Glycoprotein G Ab, IgG: 7.85 IV — ABNORMAL HIGH
Herpes Simplex Vrs I&II-IgM Ab (EIA): 0.97 INDEX

## 2012-11-21 ENCOUNTER — Ambulatory Visit (INDEPENDENT_AMBULATORY_CARE_PROVIDER_SITE_OTHER): Payer: Self-pay | Admitting: Family Medicine

## 2012-11-21 ENCOUNTER — Encounter: Payer: Self-pay | Admitting: Family Medicine

## 2012-11-21 VITALS — BP 126/77 | HR 76 | Temp 99.0°F | Ht 61.0 in | Wt 188.0 lb

## 2012-11-21 DIAGNOSIS — G510 Bell's palsy: Secondary | ICD-10-CM

## 2012-11-21 DIAGNOSIS — R109 Unspecified abdominal pain: Secondary | ICD-10-CM

## 2012-11-21 NOTE — Progress Notes (Signed)
Patient ID: [name] [name], female   DOB: 01/25/1961, 52 y.o.   MRN: [id number] [name] [name] is a 52 y.o. female who presents today for f/u of bell's palsy and for abdominal cramping.  Bells palsy: patient was hospitalized 2 weeks ago for facial paralysis. Noted left sided eye and lip drooping at time of hospitalization. She was evaluated by neurology and had unremarkable MRI/MRA of the brain and unremarkable MRI cervical spine. She was deemed not to be a candidate for antivirals or steroids due to length of symptoms. She was discharged with protective measures for her eye. She feels as though this is slightly improved. She has continued to use her eye drops, though has been using a less thick eye drop than the liquigel prescribed for her.  Abdominal cramping: 2-3x/day. Occurs only when she does not eat lunch. States she does not get this sensation if she eats lunch. States is a cramping sensation throughout her stomach. Feels as though she needs to have a bowel movement. She endorses some gas, though no diarrhea, nausea, or vomiting. She has been having normal BMs, no noted blood. She is taking aspirin. She is not taking aleve or diclofenac.    Past Medical History  Diagnosis Date  . Medical history non-contributory     History  Smoking status  . Never Smoker   Smokeless tobacco  . Never Used    No family history on file.  Current Outpatient Prescriptions on File Prior to Visit  Medication Sig Dispense Refill  . aspirin 325 MG tablet Take 1 tablet (325 mg total) by mouth daily.  30 tablet  0  . diclofenac (CATAFLAM) 50 MG tablet Tome una cada ocho o doce horas por el dolor de Black      . naproxen (NAPROSYN) 250 MG tablet Take 375 mg by mouth 2 (two) times daily with a meal.      . Polyethyl Glycol-Propyl Glycol 0.4-0.3 % GEL Place 2 drops into the left eye 2 (two) times daily as needed (irratiation of eyes).       No current facility-administered medications on  file prior to visit.    ROS: Per HPI   Physical Exam Filed Vitals:   11/21/12 0844  BP: 126/77  Pulse: 76  Temp: 99 F (37.2 C)    Physical Examination: General appearance - alert, well appearing, and in no distress Eyes - pupils equal and reactive, extraocular eye movements intact Chest - clear to auscultation, no wheezes, rales or rhonchi, symmetric air entry Heart - normal rate, regular rhythm, normal S1, S2, no murmurs, rubs, clicks or gallops Abdomen - soft, nontender, nondistended, no masses or organomegaly Neurological - Patient with left angle of mouth droop, decreased ability to close left eye, decreased left eye brow raise, CN 2,3,4,5,6,8,9,10,11,12 intact, 5/5 strength in bilateral biceps, triceps, deltoids, grip, hip flexors, quads, hamstrings, plantar and dorsiflexion, sensation to light touch intact throughout bilateral upper and lower extremities Extremities - no pedal edema noted   Assessment/Plan: Please see individual problem list.

## 2012-11-21 NOTE — Patient Instructions (Signed)
Nice to meet you. Your facial paralysis appears to be improving. Please use the pink labeled eye drops during the day any time your eye feels dry. Please use the green eye drops at night. If your eye becomes dry please let us know. Your stomach cramps are likely due to hunger pains. Please make sure you eat during the day to avoid this pain. If this pain continues to occur despite eating please let us know.  Muy bien que se rena con usted. Su parlisis facial parece estar mejorando. Utilice la etiqueta rosa gotas para los ojos durante el da cada vez que su ojo se siente seca. Por favor utilice el verde gotas para los ojos de la noche. Si el ojo se reseca, por favor hganoslo saber. El dolor de estmago debido al [employer]. Por favor asegrese que usted come Administrator para Chief of Staff. Si el dolor persiste a pesar de comer, por favor hganoslo saber.  Parlisis de Bell (Bell's Palsy) La parlisis de Bell es una enfermedad en la que un lado del rostro se debilita o se paraliza temporalmente. La mayora de las veces no se encuentra una causa. Una infeccin viral del nervio facial es la causa que ms comnmente se sospecha. El trastorno desaparece casi siempre luego de algunas semanas o meses. Sin embargo, en [employer], la debilidad puede ser [place]. A veces se utilizan medicamentos con esteroides y antivirales para mejorar el tiempo de recuperacin. Por lo general no se necesitan anlisis de sangre ni otras pruebas, pero podrn realizarse a criterio del mdico para Teacher, early years/pre causas. Es importante Medical sales representative seguimiento cuidadoso. La debilidad facial puede dificultar el parpadeo, por lo tanto es importante prevenir el resecamiento del ojo. Suelen prescribirse lgrimas artificiales para Radio producer ojo lubricado. Se debern utilizar lentes o parches para proteger el ojo, si no puede cerrarlo por completo. Si el ojo no est protegido, se podr producir un dao permanente en  la crnea (cubierta clara del ojo). Los masajes y ejercicios faciales ayudarn a Copy los msculos debilitados.  SOLICITE ATENCIN MDICA DE INMEDIATO SI:  Aumentan la debilidad, el [employer] odos, la cefalea o los [place].  Comienza con nuevos sntomas o problemas, o la zona debilitada o paralizada se extiende ms all del rostro.  Siente que su problema empeora. Document Released: 01/03/2005 Document Revised: 03/28/2011 [facility] Patient Information 2014 [place], [place].

## 2012-11-23 DIAGNOSIS — R109 Unspecified abdominal pain: Secondary | ICD-10-CM | POA: Insufficient documentation

## 2012-11-23 NOTE — Assessment & Plan Note (Signed)
Patient with cramping abdominal sensation when she does not eat lunch. She does take aspirin, though is not taking any other NSAID medications. She is not having abnormal BMs or vomiting. There is not blood in her stool. Suspect this is related to hunger pain. She was advised to eat lunch and take her aspirin with food. Discussed return precautions with the patient. She is to return to care if this does not improve with the above treatment plan.

## 2012-11-23 NOTE — Assessment & Plan Note (Addendum)
Improving slowly. Discussed use of thin artificial tears every 2-3 hours during the day time or more frequently as needed. She is to use the thicker liquigel at night. Advised the most important aspect is protection of her eye. Advised on return precautions. I will see her back in 2 weeks for follow-up or sooner as needed.

## 2012-12-12 ENCOUNTER — Ambulatory Visit (INDEPENDENT_AMBULATORY_CARE_PROVIDER_SITE_OTHER): Payer: Self-pay | Admitting: Family Medicine

## 2012-12-12 VITALS — BP 136/80 | HR 75 | Temp 98.6°F | Ht 61.0 in | Wt 188.0 lb

## 2012-12-12 DIAGNOSIS — G510 Bell's palsy: Secondary | ICD-10-CM

## 2012-12-12 DIAGNOSIS — Z0000 Encounter for general adult medical examination without abnormal findings: Secondary | ICD-10-CM | POA: Insufficient documentation

## 2012-12-12 NOTE — Patient Instructions (Signed)
Usted est haciendo genial. Por favor contine a usar las gotas para los ojos como sea necesario. Es para [name] [name]. Por favor llame para configurar esta opcin. Usted debe tener una colonoscopia. Por favor piense en hacer lo que hay que hacer. Si usted tiene cualquier sntoma, por favor hganoslo saber.

## 2012-12-12 NOTE — Progress Notes (Signed)
Patient ID: [name] [name], female   DOB: 01/25/1961, 52 y.o.   MRN: [id number]  Marikay Alar, MD Phone: [phone]  [name] [name] is a 52 y.o. female who presents today for f/u left facial nerve paralysis.  Notes this has resolved. She denies facial droop or eye irritation. She has continued to use the eye drops as needed. Note occasional pulsation at left corner of her mouth.  Discussed colonoscopy and mammogram with the patient. Patient without sufficient money to obtain colonoscopy at this time. She is to call for mammogram.   Past Medical History  Diagnosis Date  . Medical history non-contributory     History  Smoking status  . Never Smoker   Smokeless tobacco  . Never Used    No family history on file.  Current Outpatient Prescriptions on File Prior to Visit  Medication Sig Dispense Refill  . aspirin 325 MG tablet Take 1 tablet (325 mg total) by mouth daily.  30 tablet  0  . Polyethyl Glycol-Propyl Glycol 0.4-0.3 % GEL Place 2 drops into the left eye 2 (two) times daily as needed (irratiation of eyes).       No current facility-administered medications on file prior to visit.    ROS: Per HPI   Physical Exam Filed Vitals:   12/12/12 0845  BP: 136/80  Pulse: 75  Temp: 98.6 F (37 C)    Physical Examination: General appearance - alert, well appearing, and in no distress Eyes - pupils equal and reactive, extraocular eye movements intact Neurological - CN 2-12 intact, patient without evidence of left facial nerve paralysis on exam today   Assessment/Plan: Please see individual problem list.

## 2012-12-12 NOTE — Assessment & Plan Note (Signed)
Resolved. Patient can continue the eye drops as needed. To follow-up if this recurs.

## 2012-12-12 NOTE — Assessment & Plan Note (Signed)
Patient to call to schedule an appointment for mammogram. Discussed colonoscopy with the patient. She does not have funds to pay for this at this time as there is no orange card coverage for this. Discussed potentially going to [facility] or [place] for this. She states she would think about this.

## 2013-01-03 ENCOUNTER — Other Ambulatory Visit: Payer: Self-pay | Admitting: Family Medicine

## 2013-01-03 DIAGNOSIS — Z1231 Encounter for screening mammogram for malignant neoplasm of breast: Secondary | ICD-10-CM

## 2013-01-22 ENCOUNTER — Ambulatory Visit (HOSPITAL_COMMUNITY)
Admission: RE | Admit: 2013-01-22 | Discharge: 2013-01-22 | Disposition: A | Discharge status: Home / Self Care | Payer: Self-pay | Source: Ambulatory Visit | Attending: Internal Medicine | Admitting: Internal Medicine

## 2013-01-22 DIAGNOSIS — Z1231 Encounter for screening mammogram for malignant neoplasm of breast: Secondary | ICD-10-CM

## 2013-04-01 ENCOUNTER — Ambulatory Visit: Payer: No Typology Code available for payment source | Attending: Internal Medicine

## 2013-07-12 ENCOUNTER — Ambulatory Visit: Payer: No Typology Code available for payment source | Attending: Internal Medicine | Admitting: Internal Medicine

## 2013-07-12 ENCOUNTER — Encounter: Payer: Self-pay | Admitting: Internal Medicine

## 2013-07-12 VITALS — BP 143/81 | HR 75 | Temp 98.5°F | Resp 16 | Ht 61.0 in | Wt 200.0 lb

## 2013-07-12 DIAGNOSIS — M5380 Other specified dorsopathies, site unspecified: Secondary | ICD-10-CM | POA: Insufficient documentation

## 2013-07-12 DIAGNOSIS — Z1211 Encounter for screening for malignant neoplasm of colon: Secondary | ICD-10-CM | POA: Insufficient documentation

## 2013-07-12 DIAGNOSIS — Z9109 Other allergy status, other than to drugs and biological substances: Secondary | ICD-10-CM

## 2013-07-12 DIAGNOSIS — IMO0001 Reserved for inherently not codable concepts without codable children: Secondary | ICD-10-CM

## 2013-07-12 DIAGNOSIS — Z139 Encounter for screening, unspecified: Secondary | ICD-10-CM

## 2013-07-12 DIAGNOSIS — J309 Allergic rhinitis, unspecified: Secondary | ICD-10-CM | POA: Insufficient documentation

## 2013-07-12 DIAGNOSIS — M542 Cervicalgia: Secondary | ICD-10-CM | POA: Insufficient documentation

## 2013-07-12 DIAGNOSIS — R030 Elevated blood-pressure reading, without diagnosis of hypertension: Secondary | ICD-10-CM

## 2013-07-12 DIAGNOSIS — M62838 Other muscle spasm: Secondary | ICD-10-CM

## 2013-07-12 LAB — COMPLETE METABOLIC PANEL WITH GFR
ALT: 44 U/L — ABNORMAL HIGH (ref 0–35)
AST: 33 U/L (ref 0–37)
Albumin: 4.5 g/dL (ref 3.5–5.2)
Alkaline Phosphatase: 101 U/L (ref 39–117)
BILIRUBIN TOTAL: 0.7 mg/dL (ref 0.2–1.2)
BUN: 10 mg/dL (ref 6–23)
CO2: 29 meq/L (ref 19–32)
Calcium: 9.6 mg/dL (ref 8.4–10.5)
Chloride: 102 mEq/L (ref 96–112)
Creat: 0.57 mg/dL (ref 0.50–1.10)
GLUCOSE: 84 mg/dL (ref 70–99)
Potassium: 4.4 mEq/L (ref 3.5–5.3)
Sodium: 140 mEq/L (ref 135–145)
Total Protein: 7.5 g/dL (ref 6.0–8.3)

## 2013-07-12 LAB — CBC WITH DIFFERENTIAL/PLATELET
Basophils Absolute: 0 10*3/uL (ref 0.0–0.1)
Basophils Relative: 0 % (ref 0–1)
Eosinophils Absolute: 0.2 10*3/uL (ref 0.0–0.7)
Eosinophils Relative: 3 % (ref 0–5)
HCT: 39.3 % (ref 36.0–46.0)
HEMOGLOBIN: 13.6 g/dL (ref 12.0–15.0)
LYMPHS ABS: 2.3 10*3/uL (ref 0.7–4.0)
Lymphocytes Relative: 37 % (ref 12–46)
MCH: 28.8 pg (ref 26.0–34.0)
MCHC: 34.6 g/dL (ref 30.0–36.0)
MCV: 83.3 fL (ref 78.0–100.0)
MONOS PCT: 9 % (ref 3–12)
Monocytes Absolute: 0.6 10*3/uL (ref 0.1–1.0)
NEUTROS ABS: 3.2 10*3/uL (ref 1.7–7.7)
NEUTROS PCT: 51 % (ref 43–77)
Platelets: 318 10*3/uL (ref 150–400)
RBC: 4.72 MIL/uL (ref 3.87–5.11)
RDW: 14.4 % (ref 11.5–15.5)
WBC: 6.2 10*3/uL (ref 4.0–10.5)

## 2013-07-12 LAB — TSH: TSH: 2.19 u[IU]/mL (ref 0.350–4.500)

## 2013-07-12 LAB — LIPID PANEL
CHOL/HDL RATIO: 5.5 ratio
CHOLESTEROL: 301 mg/dL — AB (ref 0–200)
HDL: 55 mg/dL (ref >39)
LDL Cholesterol: 200 mg/dL — ABNORMAL HIGH (ref 0–99)
Triglycerides: 230 mg/dL — ABNORMAL HIGH (ref <150)
VLDL: 46 mg/dL — ABNORMAL HIGH (ref 0–40)

## 2013-07-12 MED ORDER — CETIRIZINE HCL 10 MG PO TABS: 10.0000 mg | ORAL_TABLET | Freq: Every day | ORAL | Status: DC

## 2013-07-12 MED ORDER — MELOXICAM 7.5 MG PO TABS: 7.5000 mg | ORAL_TABLET | Freq: Every day | ORAL | Status: DC

## 2013-07-12 MED ORDER — CYCLOBENZAPRINE HCL 10 MG PO TABS: 10.0000 mg | ORAL_TABLET | Freq: Three times a day (TID) | ORAL | Status: DC | PRN

## 2013-07-12 NOTE — Progress Notes (Signed)
Patient Demographics  [name] [name], is a 53 y.o. female  ZOX:[id number]  [id number]:[id number]  DOB - 01/25/1961  CC:  Chief Complaint  Patient presents with  . Establish Care  . Muscle Pain       HPI: [name] [name] is a 53 y.o. female here today to establish medical care. Patient has history of left sided Bell's palsy, also reported to have chronic right neck pain/spasm, she had MRI done in October 2014 which was negative. Patient denies any weakness numbness, she reported to have lot of environmental allergies stuffy nose postnasal drip, as per patient she tried a nasal spray in the past which made the symptoms worse. Patient has No headache, No chest pain, No abdominal pain - No Nausea, No new weakness tingling or numbness, No Cough - SOB.  No Known Allergies Past Medical History  Diagnosis Date  . Medical history non-contributory    Current Outpatient Prescriptions on File Prior to Visit  Medication Sig Dispense Refill  . aspirin 325 MG tablet Take 1 tablet (325 mg total) by mouth daily.  30 tablet  0  . Polyethyl Glycol-Propyl Glycol 0.4-0.3 % GEL Place 2 drops into the left eye 2 (two) times daily as needed (irratiation of eyes).       No current facility-administered medications on file prior to visit.   Family History  Problem Relation Age of Onset  . Hypertension Mother   . Diabetes Mother   . Diabetes Father    History   Social History  . Marital Status: Married    Spouse Name: N/A    Number of Children: N/A  . Years of Education: N/A   Occupational History  . Not on file.   Social History Main Topics  . Smoking status: Never Smoker   . Smokeless tobacco: Never Used  . Alcohol Use: No  . Drug Use: No  . Sexual Activity: Not on file   Other Topics Concern  . Not on file   Social History Narrative  . No narrative on file    Review of Systems: Constitutional: Negative for fever, chills, diaphoresis, activity change, appetite  change and fatigue. HENT: Negative for ear pain, nosebleeds, congestion, facial swelling, rhinorrhea, neck pain, neck stiffness and ear discharge.  Eyes: Negative for pain, discharge, redness, itching and visual disturbance. Respiratory: Negative for cough, choking, chest tightness, shortness of breath, wheezing and stridor.  Cardiovascular: Negative for chest pain, palpitations and leg swelling. Gastrointestinal: Negative for abdominal distention. Genitourinary: Negative for dysuria, urgency, frequency, hematuria, flank pain, decreased urine volume, difficulty urinating and dyspareunia.  Musculoskeletal: Negative for back pain, joint swelling, arthralgia and gait problem. Neurological: Negative for dizziness, tremors, seizures, syncope, facial asymmetry, speech difficulty, weakness, light-headedness, numbness and headaches.  Hematological: Negative for adenopathy. Does not bruise/bleed easily. Psychiatric/Behavioral: Negative for hallucinations, behavioral problems, confusion, dysphoric mood, decreased concentration and agitation.    Objective:   Filed Vitals:   07/12/13 1211  BP: 143/81  Pulse: 75  Temp: 98.5 F (36.9 C)  Resp: 16    Physical Exam: Constitutional: Obese female sitting orally not in acute distress HENT: Normocephalic, atraumatic, External right and left ear normal. Oropharynx is clear and moist.  Eyes: Conjunctivae and EOM are normal. PERRLA, no scleral icterus. Neck: Normal ROM. Neck supple. No JVD. No tracheal deviation. No thyromegaly. CVS: RRR, S1/S2 +, no murmurs, no gallops, no carotid bruit.  Pulmonary: Effort and breath sounds normal, no stridor, rhonchi, wheezes, rales.  Abdominal: Soft. BS +, no  distension, tenderness, rebound or guarding.  Musculoskeletal: Normal range of motion. No edema and no tenderness.  Neuro: Alert. Normal reflexes, muscle tone coordination. No cranial nerve deficit. Skin: Skin is warm and dry. No rash noted. Not diaphoretic. No  erythema. No pallor. Psychiatric: Normal mood and affect. Behavior, judgment, thought content normal.  Lab Results  Component Value Date   WBC 6.9 11/06/2012   HGB 12.6 11/06/2012   HCT 36.9 11/06/2012   MCV 87.2 11/06/2012   PLT 254 11/06/2012   Lab Results  Component Value Date   CREATININE 0.57 11/06/2012   BUN 15 11/06/2012   NA 138 11/06/2012   K 3.4* 11/06/2012   CL 101 11/06/2012   CO2 25 11/06/2012    Lab Results  Component Value Date   HGBA1C 6.4* 11/06/2012   Lipid Panel     Component Value Date/Time   CHOL 255* 11/07/2012 1102   TRIG 206* 11/07/2012 1102   HDL 47 11/07/2012 1102   CHOLHDL 5.4 11/07/2012 1102   VLDL 41* 11/07/2012 1102   LDLCALC 167* 11/07/2012 1102       Assessment and plan:   1. Neck pain Trial of MOBIC. - meloxicam (MOBIC) 7.5 MG tablet; Take 1 tablet (7.5 mg total) by mouth daily.  Dispense: 30 tablet; Refill: 2  2. Muscle spasms of neck As patient apply heating pad, trial of Flexeril - cyclobenzaprine (FLEXERIL) 10 MG tablet; Take 1 tablet (10 mg total) by mouth 3 (three) times daily as needed for muscle spasms.  Dispense: 30 tablet; Refill: 0  3. Environmental allergies  - cetirizine (ZYRTEC) 10 MG tablet; Take 1 tablet (10 mg total) by mouth daily.  Dispense: 30 tablet; Refill: 11  4. Special screening for malignant neoplasms, colon  - Ambulatory referral to Gastroenterology  5. Screening Baseline fasting blood work.  - COMPLETE METABOLIC PANEL WITH GFR - CBC with Differential - TSH - Lipid panel - Vit D  25 hydroxy (rtn osteoporosis monitoring)  6. Elevated BP Advised patient for DASH diet.        Health Maintenance -Colonoscopy: Referred to GI  -Mammogram: Up-to-date   Return in about 3 months (around 10/12/2013).     Doris CheadleADVANI, Kherington Meraz, MD

## 2013-07-12 NOTE — Progress Notes (Signed)
Pt here to establish care for c/o sudden right neck pain radiating down R eye/arm since yesterday. States the pain has been constant,sore pain  No otc treatment used No other medical problems reported per Spanish interpretor VSS Pt is caught up on health maintenance

## 2013-07-12 NOTE — Patient Instructions (Signed)
Plan de alimentacin DASH (DASH Eating Plan) DASH es la sigla en ingls de "Enfoques alimentarios para bajar la hipertensin". El plan de alimentacin DASH ha demostrado bajar la presin arterial elevada (hipertensin). Los beneficios adicionales para la salud pueden incluir la disminucin del riesgo de diabetes mellitus tipo2, enfermedades cardacas e ictus. Este plan tambin puede ayudar a adelgazar. QU DEBO SABER ACERCA DEL PLAN DE ALIMENTACIN DASH? Para el plan de alimentacin DASH, seguir las siguientes pautas generales:  Elija los alimentos con un valor porcentual diario de sodio de menos del 5% (segn figura en la etiqueta del alimento).  Use hierbas o aderezos sin sal, en lugar de sal de mesa o sal marina.  Consulte al mdico o farmacutico antes de usar sustitutos de la sal.  Coma productos con bajo contenido de sodio, cuya etiqueta suele decir "bajo contenido de sodio" o "sin agregado de sal".  Coma alimentos frescos.  Coma ms verduras, frutas y productos lcteos con bajo contenido de grasas.  Elija los cereales integrales. Busque el trmino "integrales" como la segunda palabra en la lista de ingredientes.  Elija el pescado y el pollo o el pavo sin piel ms a menudo que las carnes rojas. Limite el consumo de pescado, carne de ave y carne a 6onzas (170g) por da.  Limite el consumo de dulces, postres, azcares y bebidas azucaradas.  Elija las grasas saludables para el corazn.  Limite el consumo de queso a 1oz (28g) por da.  Consuma ms alimentos caseros y menos comida rpida, de bufs o de restaurantes.  Limite el consumo de alimentos fritos.  Cocine los alimentos con otros mtodos que no sean la fritura.  Limite las verduras enlatadas. Si las consume, enjuguelas bien para disminuir el sodio.  Cuando coma en un restaurante, pida que preparen su comida con menos sal o, en lo posible, sin nada de sal. QU ALIMENTOS PUEDO COMER? Pida ayuda a un nutricionista  para conocer las necesidades calricas individuales. Cereales Pan de salvado o integral. Arroz integral. Pastas de salvado o integrales. Quinua, trigo burgol y cereales integrales. Cereales con bajo contenido de sodio. Tortillas de harina de maz o de salvado. Pan de maz integral. Galletas saladas integrales. Galletas con bajo contenido de sodio. Vegetales Verduras frescas o congeladas (crudas, al vapor, asadas o grilladas). Jugos de tomate y verduras con contenido bajo o reducido de sodio. Pasta y salsa de tomate con contenido bajo o reducido de sodio. Verduras enlatadas con bajo contenido de sodio o reducido de sodio.  Frutas Frutas frescas, en conserva (en su jugo natural) o frutas congeladas. Carnes y otras fuentes de protenas Carne de res molida (al 85% o ms magra), carne de res de animales alimentados con pastos o carne de res sin la grasa. Pollo o pavo sin piel. Carne de pollo o de pavo molida. Cerdo sin la grasa. Todos los pescados y frutos de mar. Huevos. Porotos, guisantes o lentejas secos. Frutos secos y semillas sin sal. Frijoles enlatados sin sal. Lcteos Productos lcteos con bajo contenido de grasas, como leche descremada o al 1%, quesos reducidos en grasas o al 2%, ricota con bajo contenido de grasas o queso cottage, o yogur natural con bajo contenido de grasas. Quesos con contenido bajo o reducido de sodio. Grasas y aceites Margarinas en barra que no contengan grasas trans. Mayonesa y alios para ensaladas livianos o reducidos en grasas (reducidos en sodio). [place]. Aceites de crtamo, oliva o canola. Mantequilla natural de man o almendra. Otros Palomitas de maz y pretzels sin   sal. Esta no es una lista completa de los alimentos o las bebidas recomendados. Consulte a su nutricionista para conocer ms opciones. QU ALIMENTOS NO ESTN RECOMENDADOS? Cereales Pan blanco. Pastas blancas. Arroz blanco. Pan de maz refinado. Bagels y croissants. Galletas saladas que contengan  grasas trans. Vegetales Vegetales con crema o fritos. Verduras en salsa de queso. Verduras enlatadas comunes. Pasta y salsa de tomate en lata comunes. Jugos comunes de tomate y de verduras. Frutas Frutas secas. Fruta enlatada en almbar liviano o espeso. Jugo de frutas. Carnes y otras fuentes de protenas Cortes de carne con grasa. Costillas, alas de pollo, tocineta, salchicha, mortadela, salame, chinchulines, tocino, perros calientes, salchichas alemanas y embutidos envasados. Frutos secos y semillas con sal. Frijoles con sal en lata. Lcteos Leche entera o al 2%, crema, mezcla de leche y crema y queso crema. Yogur entero o endulzado. Quesos o queso azul con alto contenido de grasas. Cremas y coberturas batidas no lcteas. Quesos procesados, quesos para untar o cuajadas. Condimentos Sal de cebolla y ajo, sal condimentada, sal de mesa y sal marina. Salsas en lata y envasadas. Salsa Worcestershire. Salsa trtara. Salsa barbacoa. Salsa teriyaki. Salsa de soja, incluso la que tiene contenido reducido de sodio. Salsa de carne. Salsa de pescado. Salsa de ostras. Salsa rosada. Rbanos picantes. Ketchup y mostaza. Saborizantes y tiernizantes para carne. Caldo en cubitos. Salsa picante. Salsa tabasco. Adobos. Aderezos para tacos. Salsas. Grasas y aceites Mantequilla, margarina en barra, manteca de cerdo, grasa, mantequilla clarificada y grasa de tocineta. Aceites de coco, de palmiste o de palma. Aderezos comunes para ensalada. Otros Pickles y aceitunas. Palomitas de maz y pretzels con sal. Esta no es una lista completa de los alimentos y las bebidas que debe evitar. Consulte a su nutricionista para obtener ms informacin. DNDE PUEDO ENCONTRAR MS INFORMACIN? [employer] [facility] [employer] [facility] (National Heart, Lung, and Blood Institute): www.nhlbi.nih.gov/health/health-topics/topics/dash/ Document Released: 12/23/2010 Document Revised: 01/08/2013 ExitCare Patient Information 2015  [employer]. This information is not intended to replace advice given to you by your health care Nixxon Faria. Make sure you discuss any questions you have with your health care Armonii Sieh.  

## 2013-07-13 LAB — VITAMIN D 25 HYDROXY (VIT D DEFICIENCY, FRACTURES): Vit D, 25-Hydroxy: 24 ng/mL — ABNORMAL LOW (ref 30–89)

## 2013-07-16 ENCOUNTER — Telehealth: Payer: Self-pay

## 2013-07-16 MED ORDER — SIMVASTATIN 20 MG PO TABS: 20.0000 mg | ORAL_TABLET | Freq: Every day | ORAL | Status: DC

## 2013-07-16 MED ORDER — VITAMIN D (ERGOCALCIFEROL) 1.25 MG (50000 UNIT) PO CAPS: 50000.0000 [IU] | ORAL_CAPSULE | ORAL | Status: DC

## 2013-07-16 NOTE — Telephone Encounter (Signed)
Interpreter line used Spoke with patient's husband and he is aware of her results Prescription sent to [facility] on file

## 2013-07-16 NOTE — Telephone Encounter (Signed)
Message copied by Lestine MountJUAREZ, DENISE L on Tue Jul 16, 2013 12:42 PM ------      Message from: Doris CheadleADVANI, DEEPAK      Created: Mon Jul 15, 2013  9:31 AM       Blood work reviewed, noticed low vitamin D, call patient advise to start ergocalciferol 50,000 units once a week for the duration of  12 weeks.      Patient also has elevated cholesterol, advise for low fat diet and also start taking Zocor 20 mg daily, will repeat her fasting lipid panel on next visit. ------

## 2013-10-14 ENCOUNTER — Ambulatory Visit: Payer: Self-pay | Admitting: Internal Medicine

## 2013-10-14 ENCOUNTER — Ambulatory Visit: Payer: Self-pay

## 2013-11-01 ENCOUNTER — Ambulatory Visit: Payer: Self-pay

## 2013-11-04 ENCOUNTER — Ambulatory Visit: Payer: Self-pay | Attending: Internal Medicine

## 2013-11-04 ENCOUNTER — Ambulatory Visit: Payer: Self-pay | Attending: Internal Medicine | Admitting: Internal Medicine

## 2013-11-04 ENCOUNTER — Encounter: Payer: Self-pay | Admitting: Internal Medicine

## 2013-11-04 VITALS — BP 145/76 | HR 76 | Temp 98.0°F | Resp 16 | Wt 205.6 lb

## 2013-11-04 DIAGNOSIS — N898 Other specified noninflammatory disorders of vagina: Secondary | ICD-10-CM | POA: Insufficient documentation

## 2013-11-04 DIAGNOSIS — Z23 Encounter for immunization: Secondary | ICD-10-CM | POA: Insufficient documentation

## 2013-11-04 DIAGNOSIS — E785 Hyperlipidemia, unspecified: Secondary | ICD-10-CM | POA: Insufficient documentation

## 2013-11-04 DIAGNOSIS — R350 Frequency of micturition: Secondary | ICD-10-CM | POA: Insufficient documentation

## 2013-11-04 DIAGNOSIS — Z791 Long term (current) use of non-steroidal anti-inflammatories (NSAID): Secondary | ICD-10-CM | POA: Insufficient documentation

## 2013-11-04 DIAGNOSIS — IMO0001 Reserved for inherently not codable concepts without codable children: Secondary | ICD-10-CM

## 2013-11-04 DIAGNOSIS — Z7982 Long term (current) use of aspirin: Secondary | ICD-10-CM | POA: Insufficient documentation

## 2013-11-04 DIAGNOSIS — E559 Vitamin D deficiency, unspecified: Secondary | ICD-10-CM | POA: Insufficient documentation

## 2013-11-04 DIAGNOSIS — R030 Elevated blood-pressure reading, without diagnosis of hypertension: Secondary | ICD-10-CM | POA: Insufficient documentation

## 2013-11-04 LAB — POCT URINALYSIS DIPSTICK
BILIRUBIN UA: NEGATIVE
Glucose, UA: NEGATIVE
Ketones, UA: NEGATIVE
LEUKOCYTES UA: NEGATIVE
Nitrite, UA: NEGATIVE
PH UA: 5
Protein, UA: NEGATIVE
Spec Grav, UA: <=1.005
Urobilinogen, UA: 0.2

## 2013-11-04 LAB — LIPID PANEL
Cholesterol: 199 mg/dL (ref 0–200)
HDL: 57 mg/dL (ref >39)
LDL CALC: 105 mg/dL — AB (ref 0–99)
Total CHOL/HDL Ratio: 3.5 Ratio
Triglycerides: 185 mg/dL — ABNORMAL HIGH (ref <150)
VLDL: 37 mg/dL (ref 0–40)

## 2013-11-04 MED ORDER — FLUCONAZOLE 150 MG PO TABS: 150.0000 mg | ORAL_TABLET | Freq: Once | ORAL | Status: DC

## 2013-11-04 MED ORDER — METRONIDAZOLE 500 MG PO TABS: 500.0000 mg | ORAL_TABLET | Freq: Two times a day (BID) | ORAL | Status: DC

## 2013-11-04 NOTE — Patient Instructions (Signed)
Plan de alimentacin DASH (DASH Eating Plan) DASH es la sigla en ingls de "Enfoques Alimentarios para Detener la Hipertensin". El plan de alimentacin DASH ha demostrado bajar la presin arterial elevada (hipertensin). Los beneficios adicionales para la salud pueden incluir la disminucin del riesgo de diabetes mellitus tipo2, enfermedades cardacas e ictus. Este plan tambin puede ayudar a adelgazar. QU DEBO SABER ACERCA DEL PLAN DE ALIMENTACIN DASH? Para el plan de alimentacin DASH, seguir las siguientes pautas generales:  Elija los alimentos con un valor porcentual diario de sodio de menos del 5% (segn figura en la etiqueta del alimento).  Use hierbas o aderezos sin sal, en lugar de sal de mesa o sal marina.  Consulte al mdico o farmacutico antes de usar sustitutos de la sal.  Coma productos con bajo contenido de sodio, cuya etiqueta suele decir "bajo contenido de sodio" o "sin agregado de sal".  Coma alimentos frescos.  Coma ms verduras, frutas y productos lcteos con bajo contenido de grasas.  Elija los cereales integrales. Busque la palabra "integral" en el primer lugar de la lista de ingredientes.  Elija el pescado y el pollo o el pavo sin piel ms a menudo que las carnes rojas. Limite el consumo de pescado, carne de ave y carne a 6onzas (170g) por da.  Limite el consumo de dulces, postres, azcares y bebidas azucaradas.  Elija las grasas saludables para el corazn.  Limite el consumo de queso a 1onza (28g) por da.  Consuma ms comida casera y menos de restaurante, de buf y comida rpida.  Limite el consumo de alimentos fritos.  Cocine los alimentos utilizando mtodos que no sean la fritura.  Limite las verduras enlatadas. Si las consume, enjuguelas bien para disminuir el sodio.  Cuando coma en un restaurante, pida que preparen su comida con menos sal o, en lo posible, sin nada de sal. QU ALIMENTOS PUEDO COMER? Pida ayuda a un nutricionista para  conocer las necesidades calricas individuales. Cereales Pan de salvado o integral. Arroz integral. Pastas de salvado o integrales. Quinua, trigo burgol y cereales integrales. Cereales con bajo contenido de sodio. Tortillas de harina de maz o de salvado. Pan de maz integral. Galletas saladas integrales. Galletas con bajo contenido de sodio. Vegetales Verduras frescas o congeladas (crudas, al vapor, asadas o grilladas). Jugos de tomate y verduras con contenido bajo o reducido de sodio. Pasta y salsa de tomate con contenido bajo o reducido de sodio. Verduras enlatadas con bajo contenido de sodio o reducido de sodio.  Frutas Frutas frescas, en conserva (en su jugo natural) o frutas congeladas. Carnes y otros productos con protenas Carne de res molida (al 85% o ms magra), carne de res de animales alimentados con pastos o carne de res sin la grasa. Pollo o pavo sin piel. Carne de pollo o de pavo molida. Cerdo sin la grasa. Todos los pescados y frutos de mar. Huevos. Porotos, guisantes o lentejas secos. Frutos secos y semillas sin sal. Frijoles enlatados sin sal. Lcteos Productos lcteos con bajo contenido de grasas, como leche descremada o al 1%, quesos reducidos en grasas o al 2%, ricota con bajo contenido de grasas o queso cottage, o yogur natural con bajo contenido de grasas. Quesos con contenido bajo o reducido de sodio. Grasas y aceites Margarinas en barra que no contengan grasas trans. Mayonesa y alios para ensaladas livianos o reducidos en grasas (reducidos en sodio). [place]. Aceites de crtamo, oliva o canola. Mantequilla natural de man o almendra. Otros Palomitas de maz y pretzels sin sal.   Los artculos mencionados arriba pueden no ser una lista completa de las bebidas o los alimentos recomendados. Comunquese con el nutricionista para conocer ms opciones. QU ALIMENTOS NO SE RECOMIENDAN? Cereales Pan blanco. Pastas blancas. Arroz blanco. Pan de maz refinado. Bagels y  croissants. Galletas saladas que contengan grasas trans. Vegetales Vegetales con crema o fritos. Verduras en salsa de queso. Verduras enlatadas comunes. Pasta y salsa de tomate en lata comunes. Jugos comunes de tomate y de verduras. Frutas Frutas secas. Fruta enlatada en almbar liviano o espeso. Jugo de frutas. Carnes y otros productos con protenas Cortes de carne con grasa. Costillas, alas de pollo, tocineta, salchicha, mortadela, salame, chinchulines, tocino, perros calientes, salchichas alemanas y embutidos envasados. Frutos secos y semillas con sal. Frijoles con sal en lata. Lcteos Leche entera o al 2%, crema, mezcla de leche y crema, y queso crema. Yogur entero o endulzado. Quesos o queso azul con alto contenido de grasas. Cremas no lcteas y coberturas batidas. Quesos procesados, quesos para untar o cuajadas. Condimentos Sal de cebolla y ajo, sal condimentada, sal de mesa y sal marina. Salsas en lata y envasadas. Salsa Worcestershire. Salsa trtara. Salsa barbacoa. Salsa teriyaki. Salsa de soja, incluso la que tiene contenido reducido de sodio. Salsa de carne. Salsa de pescado. Salsa de ostras. Salsa rosada. Rbano picante. Ketchup y mostaza. Saborizantes y tiernizantes para carne. Caldo en cubitos. Salsa picante. Salsa tabasco. Adobos. Aderezos para tacos. Salsas. Grasas y aceites Mantequilla, margarina en barra, manteca de cerdo, grasa, mantequilla clarificada y grasa de tocino. Aceites de coco, de palmiste o de palma. Aderezos comunes para ensalada. Otros Pickles y aceitunas. Palomitas de maz y pretzels con sal. Los artculos mencionados arriba pueden no ser una lista completa de las bebidas y los alimentos que se deben evitar. Comunquese con el nutricionista para obtener ms informacin. DNDE PUEDO ENCONTRAR MS INFORMACIN? [facility] [employer] [place] (National Heart, Lung, and Blood Institute):  www.nhlbi.nih.gov/health/health-topics/topics/dash/ Document Released: 12/23/2010 Document Revised: 05/20/2013 ExitCare Patient Information 2015 [employer]. This information is not intended to replace advice given to you by your health care provider. Make sure you discuss any questions you have with your health care provider.   

## 2013-11-04 NOTE — Progress Notes (Signed)
MRN: [id number] Name: [name] [name]  Sex: female Age: 53 y.o. DOB: 01/25/1961  Allergies: Review of patient's allergies indicates no known allergies.  Chief Complaint  Patient presents with  . Abdominal Pain    HPI: Patient is 53 y.o. female who comes today reported to have urinary frequency denies any dysuria fever chills does report vaginal discharge, she also has hyperlipidemia and is per patient she is taking cholesterol medication, today her blood pressure is elevated, she does report family history of hypertension, denies any  headache dizziness chest and shortness of breath.  Past Medical History  Diagnosis Date  . Medical history non-contributory     Past Surgical History  Procedure Laterality Date  . No past surgeries    . Tubal ligation        Medication List       This list is accurate as of: 11/04/13 10:26 AM.  Always use your most recent med list.               aspirin 325 MG tablet  Take 1 tablet (325 mg total) by mouth daily.     cetirizine 10 MG tablet  Commonly known as:  ZYRTEC  Take 1 tablet (10 mg total) by mouth daily.     cyclobenzaprine 10 MG tablet  Commonly known as:  FLEXERIL  Take 1 tablet (10 mg total) by mouth 3 (three) times daily as needed for muscle spasms.     fluconazole 150 MG tablet  Commonly known as:  DIFLUCAN  Take 1 tablet (150 mg total) by mouth once.     meloxicam 7.5 MG tablet  Commonly known as:  MOBIC  Take 1 tablet (7.5 mg total) by mouth daily.     metroNIDAZOLE 500 MG tablet  Commonly known as:  FLAGYL  Take 1 tablet (500 mg total) by mouth 2 (two) times daily.     Polyethyl Glycol-Propyl Glycol 0.4-0.3 % Gel  Place 2 drops into the left eye 2 (two) times daily as needed (irratiation of eyes).     simvastatin 20 MG tablet  Commonly known as:  ZOCOR  Take 1 tablet (20 mg total) by mouth at bedtime.     Vitamin D (Ergocalciferol) 50000 UNITS Caps capsule  Commonly known as:  DRISDOL  Take  1 capsule (50,000 Units total) by mouth every 7 (seven) days.        Meds ordered this encounter  Medications  . fluconazole (DIFLUCAN) 150 MG tablet    Sig: Take 1 tablet (150 mg total) by mouth once.    Dispense:  1 tablet    Refill:  0  . metroNIDAZOLE (FLAGYL) 500 MG tablet    Sig: Take 1 tablet (500 mg total) by mouth 2 (two) times daily.    Dispense:  14 tablet    Refill:  0    Immunization History  Administered Date(s) Administered  . Influenza,inj,Quad PF,36+ Mos 11/07/2012, 11/04/2013    Family History  Problem Relation Age of Onset  . Hypertension Mother   . Diabetes Mother   . Diabetes Father     History  Substance Use Topics  . Smoking status: Never Smoker   . Smokeless tobacco: Never Used  . Alcohol Use: No    Review of Systems   As noted in HPI  Filed Vitals:   11/04/13 0922  BP: 145/76  Pulse: 76  Temp: 98 F (36.7 C)  Resp: 16    Physical Exam  Physical Exam  Constitutional:  No distress.  Eyes: EOM are normal. Pupils are equal, round, and reactive to light.  Cardiovascular: Normal rate and regular rhythm.   Pulmonary/Chest: Breath sounds normal. No respiratory distress. She has no wheezes. She has no rales.  Abdominal: Soft. There is no tenderness. There is no rebound.    CBC    Component Value Date/Time   WBC 6.2 07/12/2013 1253   RBC 4.72 07/12/2013 1253   HGB 13.6 07/12/2013 1253   HCT 39.3 07/12/2013 1253   PLT 318 07/12/2013 1253   MCV 83.3 07/12/2013 1253   LYMPHSABS 2.3 07/12/2013 1253   MONOABS 0.6 07/12/2013 1253   EOSABS 0.2 07/12/2013 1253   BASOSABS 0.0 07/12/2013 1253    CMP     Component Value Date/Time   NA 140 07/12/2013 1253   K 4.4 07/12/2013 1253   CL 102 07/12/2013 1253   CO2 29 07/12/2013 1253   GLUCOSE 84 07/12/2013 1253   BUN 10 07/12/2013 1253   CREATININE 0.57 07/12/2013 1253   CREATININE 0.57 11/06/2012 2355   CALCIUM 9.6 07/12/2013 1253   PROT 7.5 07/12/2013 1253   ALBUMIN 4.5 07/12/2013 1253   AST 33  07/12/2013 1253   ALT 44* 07/12/2013 1253   ALKPHOS 101 07/12/2013 1253   BILITOT 0.7 07/12/2013 1253   GFRNONAA >89 07/12/2013 1253   GFRNONAA >90 11/06/2012 2355   GFRAA >89 07/12/2013 1253   GFRAA >90 11/06/2012 2355    Lab Results  Component Value Date/Time   CHOL 301* 07/12/2013 12:53 PM    No components found with this basename: hga1c    Lab Results  Component Value Date/Time   AST 33 07/12/2013 12:53 PM    Assessment and Plan  Frequent urination - Plan:  Results for orders placed in visit on 11/04/13  POCT URINALYSIS DIPSTICK      Result Value Ref Range   Color, UA yellow     Clarity, UA clear     Glucose, UA neg     Bilirubin, UA neg     Ketones, UA neg     Spec Grav, UA <=1.005     Blood, UA trace-intact     pH, UA 5.0     Protein, UA neg     Urobilinogen, UA 0.2     Nitrite, UA neg     Leukocytes, UA Negative     Urinalysis Dipstick negative for infection.  Vaginal discharge - Plan: fluconazole (DIFLUCAN) 150 MG tablet, metroNIDAZOLE (FLAGYL) 500 MG tablet  Hyperlipidemia - Plan: Currently patient is on Zocor 20 mg, will check Lipid panel  Vitamin D deficiency Take OTC vitamin D 2000 units daily  Elevated BP Advise for DASH diet. If on the following visit her blood pressure is persistently elevated consider starting on antihypertensive.  Needs flu shot  Flu shot given today.   Health Maintenance  -Mammogram: uptodate  -Vaccinations:  Flu shot today   Return in about 3 months (around 02/04/2014) for hyperipidemia.  Doris CheadleADVANI, Bryann Gentz, MD

## 2013-11-04 NOTE — Progress Notes (Signed)
Patient complains of having increased urinary frequency And complains of having a bloated feeling in her abd for the past three days

## 2013-11-08 ENCOUNTER — Telehealth: Payer: Self-pay

## 2013-11-08 NOTE — Telephone Encounter (Signed)
Interpreter line used Patient not available Left message on voice mail to return our call 

## 2013-11-08 NOTE — Telephone Encounter (Signed)
Message copied by Lestine MountJUAREZ, Tayshun Gappa L on Fri Nov 08, 2013 11:29 AM ------      Message from: Doris CheadleADVANI, DEEPAK      Created: Tue Nov 05, 2013 12:59 PM       Call and let the patient know that her lipid panel is improved, continue with current dose of Zocor as well as low fat diet. ------

## 2013-11-20 ENCOUNTER — Other Ambulatory Visit: Payer: Self-pay | Admitting: Internal Medicine

## 2013-12-18 ENCOUNTER — Telehealth: Payer: Self-pay | Admitting: Internal Medicine

## 2013-12-18 ENCOUNTER — Other Ambulatory Visit: Payer: Self-pay | Admitting: *Deleted

## 2013-12-18 MED ORDER — SIMVASTATIN 20 MG PO TABS: 20.0000 mg | ORAL_TABLET | Freq: Every day | ORAL | Status: DC

## 2013-12-18 NOTE — Telephone Encounter (Signed)
Patient is given refill on Zocor. Patient has history of Bell's palsy and was prescribed high-dose aspirin last year, at this point patient can take over-the-counter low-dose aspirin 81 mg daily. Also patient has already been treated with vitamin D prescription dose 50,000 units, now she can start taking over-the-counter 2000 units every day.

## 2013-12-18 NOTE — Telephone Encounter (Signed)
Pt requested refill Rx aspirin 325 MG

## 2013-12-18 NOTE — Telephone Encounter (Signed)
Pt. Came into facility to request a refill on Vitamin D, Ergocalciferol, (DRISDOL) 50000 UNITS CAPS capsule, aspirin 325 MG tablet, and simvastatin (ZOCOR) 20 MG tablet. Please f/u with pt.

## 2014-01-23 ENCOUNTER — Other Ambulatory Visit: Payer: Self-pay | Admitting: Internal Medicine

## 2014-01-23 DIAGNOSIS — Z1231 Encounter for screening mammogram for malignant neoplasm of breast: Secondary | ICD-10-CM

## 2014-01-30 ENCOUNTER — Ambulatory Visit (HOSPITAL_COMMUNITY)
Admission: RE | Admit: 2014-01-30 | Discharge: 2014-01-30 | Disposition: A | Discharge status: Home / Self Care | Payer: Self-pay | Source: Ambulatory Visit | Attending: Internal Medicine | Admitting: Internal Medicine

## 2014-01-30 DIAGNOSIS — Z1231 Encounter for screening mammogram for malignant neoplasm of breast: Secondary | ICD-10-CM | POA: Insufficient documentation

## 2014-02-26 ENCOUNTER — Encounter: Payer: Self-pay | Admitting: Internal Medicine

## 2014-02-26 ENCOUNTER — Ambulatory Visit: Payer: Self-pay | Attending: Internal Medicine | Admitting: Internal Medicine

## 2014-02-26 VITALS — BP 121/78 | HR 87 | Temp 98.3°F | Ht 60.0 in | Wt 203.0 lb

## 2014-02-26 DIAGNOSIS — Z7982 Long term (current) use of aspirin: Secondary | ICD-10-CM | POA: Insufficient documentation

## 2014-02-26 DIAGNOSIS — E785 Hyperlipidemia, unspecified: Secondary | ICD-10-CM | POA: Insufficient documentation

## 2014-02-26 DIAGNOSIS — M542 Cervicalgia: Secondary | ICD-10-CM | POA: Insufficient documentation

## 2014-02-26 DIAGNOSIS — Z79899 Other long term (current) drug therapy: Secondary | ICD-10-CM | POA: Insufficient documentation

## 2014-02-26 DIAGNOSIS — K30 Functional dyspepsia: Secondary | ICD-10-CM | POA: Insufficient documentation

## 2014-02-26 DIAGNOSIS — Z792 Long term (current) use of antibiotics: Secondary | ICD-10-CM | POA: Insufficient documentation

## 2014-02-26 DIAGNOSIS — R1013 Epigastric pain: Secondary | ICD-10-CM

## 2014-02-26 DIAGNOSIS — R635 Abnormal weight gain: Secondary | ICD-10-CM | POA: Insufficient documentation

## 2014-02-26 DIAGNOSIS — Z791 Long term (current) use of non-steroidal anti-inflammatories (NSAID): Secondary | ICD-10-CM | POA: Insufficient documentation

## 2014-02-26 MED ORDER — OMEPRAZOLE 20 MG PO CPDR: 20.0000 mg | DELAYED_RELEASE_CAPSULE | Freq: Every day | ORAL | Status: DC

## 2014-02-26 MED ORDER — MELOXICAM 7.5 MG PO TABS: 7.5000 mg | ORAL_TABLET | Freq: Every day | ORAL | Status: DC

## 2014-02-26 NOTE — Progress Notes (Signed)
Patient is here for follow-up of hyperlipidemia.  She c/o having abdominal pain in the center of her abdomen x 1 week. She states it is worse if she does not eat. She has also had nausea. Denies vomiting. Pt also c/o having a constant throbbing back pain over her entire back. This is worse when she works.  Pt is not taking flexeril because it makes her very sleepy.  Pt drank one cup of juice this morning for breakfast.  Interpreter present.

## 2014-02-26 NOTE — Progress Notes (Signed)
MRN: [id number] Name: [name] [name]  Sex: female Age: 54 y.o. DOB: 01/25/1961  Allergies: Review of patient's allergies indicates no known allergies.  Chief Complaint  Patient presents with  . Follow-up    HPI: Patient is 54 y.o. female who has history of hyperlipidemia, comes today for followup and is requesting refill on pain medication, she also complained of bloating and dyspepsia symptoms sometimes has reflux, denies any nausea vomiting has occasional constipation, denies any fever chills, patient has been taking her medications regularly. Patient has reported to have gained weight.  Past Medical History  Diagnosis Date  . Medical history non-contributory     Past Surgical History  Procedure Laterality Date  . No past surgeries    . Tubal ligation        Medication List       This list is accurate as of: 02/26/14 10:33 AM.  Always use your most recent med list.               aspirin 325 MG tablet  Take 1 tablet (325 mg total) by mouth daily.     cetirizine 10 MG tablet  Commonly known as:  ZYRTEC  Take 1 tablet (10 mg total) by mouth daily.     cyclobenzaprine 10 MG tablet  Commonly known as:  FLEXERIL  Take 1 tablet (10 mg total) by mouth 3 (three) times daily as needed for muscle spasms.     fluconazole 150 MG tablet  Commonly known as:  DIFLUCAN  Take 1 tablet (150 mg total) by mouth once.     meloxicam 7.5 MG tablet  Commonly known as:  MOBIC  Take 1 tablet (7.5 mg total) by mouth daily.     metroNIDAZOLE 500 MG tablet  Commonly known as:  FLAGYL  Take 1 tablet (500 mg total) by mouth 2 (two) times daily.     omeprazole 20 MG capsule  Commonly known as:  PRILOSEC  Take 1 capsule (20 mg total) by mouth daily.     Polyethyl Glycol-Propyl Glycol 0.4-0.3 % Gel  Place 2 drops into the left eye 2 (two) times daily as needed (irratiation of eyes).     simvastatin 20 MG tablet  Commonly known as:  ZOCOR  Take 1 tablet (20 mg total) by  mouth at bedtime.     Vitamin D (Ergocalciferol) 50000 UNITS Caps capsule  Commonly known as:  DRISDOL  Take 1 capsule (50,000 Units total) by mouth every 7 (seven) days.        Meds ordered this encounter  Medications  . meloxicam (MOBIC) 7.5 MG tablet    Sig: Take 1 tablet (7.5 mg total) by mouth daily.    Dispense:  30 tablet    Refill:  2  . omeprazole (PRILOSEC) 20 MG capsule    Sig: Take 1 capsule (20 mg total) by mouth daily.    Dispense:  30 capsule    Refill:  3    Immunization History  Administered Date(s) Administered  . Influenza,inj,Quad PF,36+ Mos 11/07/2012, 11/04/2013    Family History  Problem Relation Age of Onset  . Hypertension Mother   . Diabetes Mother   . Diabetes Father     History  Substance Use Topics  . Smoking status: Never Smoker   . Smokeless tobacco: Never Used  . Alcohol Use: No    Review of Systems   As noted in HPI  Filed Vitals:   02/26/14 1011  BP: 121/78  Pulse: 87  Temp: 98.3 F (36.8 C)    Physical Exam  Physical Exam  Constitutional: No distress.  Eyes: EOM are normal. Pupils are equal, round, and reactive to light.  Cardiovascular: Normal rate and regular rhythm.   Pulmonary/Chest: Breath sounds normal. No respiratory distress. She has no wheezes. She has no rales.  Abdominal: Soft. She exhibits no distension. There is no tenderness. There is no rebound.    CBC    Component Value Date/Time   WBC 6.2 07/12/2013 1253   RBC 4.72 07/12/2013 1253   HGB 13.6 07/12/2013 1253   HCT 39.3 07/12/2013 1253   PLT 318 07/12/2013 1253   MCV 83.3 07/12/2013 1253   LYMPHSABS 2.3 07/12/2013 1253   MONOABS 0.6 07/12/2013 1253   EOSABS 0.2 07/12/2013 1253   BASOSABS 0.0 07/12/2013 1253    CMP     Component Value Date/Time   NA 140 07/12/2013 1253   K 4.4 07/12/2013 1253   CL 102 07/12/2013 1253   CO2 29 07/12/2013 1253   GLUCOSE 84 07/12/2013 1253   BUN 10 07/12/2013 1253   CREATININE 0.57 07/12/2013 1253    CREATININE 0.57 11/06/2012 2355   CALCIUM 9.6 07/12/2013 1253   PROT 7.5 07/12/2013 1253   ALBUMIN 4.5 07/12/2013 1253   AST 33 07/12/2013 1253   ALT 44* 07/12/2013 1253   ALKPHOS 101 07/12/2013 1253   BILITOT 0.7 07/12/2013 1253   GFRNONAA >89 07/12/2013 1253   GFRNONAA >90 11/06/2012 2355   GFRAA >89 07/12/2013 1253   GFRAA >90 11/06/2012 2355    Lab Results  Component Value Date/Time   CHOL 199 11/04/2013 09:52 AM    No components found for: HGA1C  Lab Results  Component Value Date/Time   AST 33 07/12/2013 12:53 PM    Assessment and Plan  Dyspepsia - Plan: advised patient for lifestyle modification, trial of omeprazole (PRILOSEC) 20 MG capsule  Neck pain - Plan: meloxicam (MOBIC) 7.5 MG tablet  Hyperlipidemia - Plan: currently patient is on Zocor, will check Lipid panel  Weight gain - Plan: TSH   Health Maintenance : -Vaccinations:  Up-to-date with flu shot.  Return in about 3 months (around 05/27/2014) for hyperipidemia.  Doris CheadleADVANI, Gabriela Giannelli, MD

## 2014-02-27 ENCOUNTER — Telehealth: Payer: Self-pay

## 2014-02-27 DIAGNOSIS — E7800 Pure hypercholesterolemia, unspecified: Secondary | ICD-10-CM

## 2014-02-27 LAB — LIPID PANEL
Cholesterol: 213 mg/dL — ABNORMAL HIGH (ref 0–200)
HDL: 51 mg/dL (ref >39)
LDL Cholesterol: 122 mg/dL — ABNORMAL HIGH (ref 0–99)
TRIGLYCERIDES: 199 mg/dL — AB (ref <150)
Total CHOL/HDL Ratio: 4.2 Ratio
VLDL: 40 mg/dL (ref 0–40)

## 2014-02-27 LAB — TSH: TSH: 1.18 u[IU]/mL (ref 0.350–4.500)

## 2014-02-27 MED ORDER — SIMVASTATIN 40 MG PO TABS: 40.0000 mg | ORAL_TABLET | Freq: Every day | ORAL | Status: DC

## 2014-02-27 NOTE — Telephone Encounter (Signed)
-----   Message from Doris Cheadleeepak Advani, MD sent at 02/27/2014  9:25 AM EST ----- Blood work reviewed, noticed elevated cholesterol, advise patient for low fat diet, also increase the dose of Zocor to 40 mg. Will repeat fasting lipid panel on the next visit.

## 2014-02-27 NOTE — Telephone Encounter (Signed)
Interpreter line Osie Bondused-abraham ZO#[id number] Patient is aware of her lab results Prescription sent to pharmacy on file

## 2014-05-26 ENCOUNTER — Ambulatory Visit: Payer: Self-pay | Attending: Internal Medicine

## 2014-06-04 ENCOUNTER — Ambulatory Visit: Payer: Self-pay | Attending: Internal Medicine | Admitting: Internal Medicine

## 2014-06-04 ENCOUNTER — Ambulatory Visit (HOSPITAL_COMMUNITY)
Admission: RE | Admit: 2014-06-04 | Discharge: 2014-06-04 | Disposition: A | Discharge status: Home / Self Care | Payer: Self-pay | Source: Ambulatory Visit | Attending: Cardiology | Admitting: Cardiology

## 2014-06-04 ENCOUNTER — Encounter: Payer: Self-pay | Admitting: Internal Medicine

## 2014-06-04 ENCOUNTER — Other Ambulatory Visit: Payer: Self-pay

## 2014-06-04 VITALS — BP 116/72 | HR 83 | Temp 98.0°F | Resp 16 | Wt 202.2 lb

## 2014-06-04 DIAGNOSIS — Z833 Family history of diabetes mellitus: Secondary | ICD-10-CM | POA: Insufficient documentation

## 2014-06-04 DIAGNOSIS — M542 Cervicalgia: Secondary | ICD-10-CM | POA: Insufficient documentation

## 2014-06-04 DIAGNOSIS — R079 Chest pain, unspecified: Secondary | ICD-10-CM | POA: Insufficient documentation

## 2014-06-04 DIAGNOSIS — E785 Hyperlipidemia, unspecified: Secondary | ICD-10-CM | POA: Insufficient documentation

## 2014-06-04 DIAGNOSIS — M62838 Other muscle spasm: Secondary | ICD-10-CM

## 2014-06-04 DIAGNOSIS — M6248 Contracture of muscle, other site: Secondary | ICD-10-CM | POA: Insufficient documentation

## 2014-06-04 LAB — LIPID PANEL
CHOLESTEROL: 179 mg/dL (ref 0–200)
HDL: 45 mg/dL — ABNORMAL LOW (ref >=46)
LDL Cholesterol: 94 mg/dL (ref 0–99)
TRIGLYCERIDES: 198 mg/dL — AB (ref <150)
Total CHOL/HDL Ratio: 4 Ratio
VLDL: 40 mg/dL (ref 0–40)

## 2014-06-04 LAB — COMPLETE METABOLIC PANEL WITH GFR
ALBUMIN: 4.4 g/dL (ref 3.5–5.2)
ALT: 35 U/L (ref 0–35)
AST: 26 U/L (ref 0–37)
Alkaline Phosphatase: 90 U/L (ref 39–117)
BUN: 10 mg/dL (ref 6–23)
CHLORIDE: 105 meq/L (ref 96–112)
CO2: 27 mEq/L (ref 19–32)
Calcium: 9.7 mg/dL (ref 8.4–10.5)
Creat: 0.49 mg/dL — ABNORMAL LOW (ref 0.50–1.10)
GFR, Est African American: >89 mL/min
GLUCOSE: 128 mg/dL — AB (ref 70–99)
Potassium: 4 mEq/L (ref 3.5–5.3)
Sodium: 140 mEq/L (ref 135–145)
Total Bilirubin: 0.9 mg/dL (ref 0.2–1.2)
Total Protein: 7.4 g/dL (ref 6.0–8.3)

## 2014-06-04 LAB — HEMOGLOBIN A1C
HEMOGLOBIN A1C: 7.4 % — AB (ref <5.7)
Mean Plasma Glucose: 166 mg/dL — ABNORMAL HIGH (ref <117)

## 2014-06-04 MED ORDER — MELOXICAM 7.5 MG PO TABS: 7.5000 mg | ORAL_TABLET | Freq: Every day | ORAL | Status: DC

## 2014-06-04 MED ORDER — CYCLOBENZAPRINE HCL 5 MG PO TABS: 5.0000 mg | ORAL_TABLET | Freq: Every day | ORAL | Status: DC

## 2014-06-04 NOTE — Progress Notes (Signed)
MRN: [id number] Name: [name] [name]  Sex: female Age: 54 y.o. DOB: 01/25/1961  Allergies: Review of patient's allergies indicates no known allergies.  Chief Complaint  Patient presents with  . Leg Pain  . Chest Pain    HPI: Patient is 54 y.o. female who history of hyperlipidemia, GERD, neck pain , comes today reported to have on and off chest pain/discomfort which she felt was a pressure type occasionally going up to her neck, currently she denies those symptoms but had her one 2 days ago when it happens it lasts for 1 hour usually 4/10 in intensity it happens when she is under a lot of stress and resolves when she drinks water, she denies any exertional chest pain , palpitation or shortness of breath, denies any orthopnea PND or leg swelling, denies any family history of heart disease, today EKG done and compared to one in 2014  has nonspecific t wave  Flattening and lateral leads. She occasionally has some headache denies any currently.  Past Medical History  Diagnosis Date  . Medical history non-contributory     Past Surgical History  Procedure Laterality Date  . No past surgeries    . Tubal ligation        Medication List       This list is accurate as of: 06/04/14 10:56 AM.  Always use your most recent med list.               aspirin 325 MG tablet  Take 1 tablet (325 mg total) by mouth daily.     cetirizine 10 MG tablet  Commonly known as:  ZYRTEC  Take 1 tablet (10 mg total) by mouth daily.     cyclobenzaprine 5 MG tablet  Commonly known as:  FLEXERIL  Take 1 tablet (5 mg total) by mouth at bedtime.     fluconazole 150 MG tablet  Commonly known as:  DIFLUCAN  Take 1 tablet (150 mg total) by mouth once.     meloxicam 7.5 MG tablet  Commonly known as:  MOBIC  Take 1 tablet (7.5 mg total) by mouth daily.     metroNIDAZOLE 500 MG tablet  Commonly known as:  FLAGYL  Take 1 tablet (500 mg total) by mouth 2 (two) times daily.     omeprazole 20  MG capsule  Commonly known as:  PRILOSEC  Take 1 capsule (20 mg total) by mouth daily.     Polyethyl Glycol-Propyl Glycol 0.4-0.3 % Gel  Place 2 drops into the left eye 2 (two) times daily as needed (irratiation of eyes).     simvastatin 40 MG tablet  Commonly known as:  ZOCOR  Take 1 tablet (40 mg total) by mouth at bedtime.     Vitamin D (Ergocalciferol) 50000 UNITS Caps capsule  Commonly known as:  DRISDOL  Take 1 capsule (50,000 Units total) by mouth every 7 (seven) days.        Meds ordered this encounter  Medications  . cyclobenzaprine (FLEXERIL) 5 MG tablet    Sig: Take 1 tablet (5 mg total) by mouth at bedtime.    Dispense:  30 tablet    Refill:  1  . meloxicam (MOBIC) 7.5 MG tablet    Sig: Take 1 tablet (7.5 mg total) by mouth daily.    Dispense:  30 tablet    Refill:  2    Immunization History  Administered Date(s) Administered  . Influenza,inj,Quad PF,36+ Mos 11/07/2012, 11/04/2013    Family History  Problem Relation Age of Onset  . Hypertension Mother   . Diabetes Mother   . Diabetes Father     History  Substance Use Topics  . Smoking status: Never Smoker   . Smokeless tobacco: Never Used  . Alcohol Use: No    Review of Systems   As noted in HPI  Filed Vitals:   06/04/14 1019  BP: 116/72  Pulse: 83  Temp: 98 F (36.7 C)  Resp: 16    Physical Exam  Physical Exam  Constitutional: She is oriented to person, place, and time.  Eyes: EOM are normal. Pupils are equal, round, and reactive to light.  Neck: Normal range of motion. Neck supple.  Cardiovascular: Normal rate and regular rhythm.   Pulmonary/Chest: Breath sounds normal. No respiratory distress. She has no wheezes. She has no rales.  Left-sided minimal chest wall tenderness with palpation( she reported to similar pain when she had 2 days ago)  Musculoskeletal: She exhibits no edema.  Neurological: She is alert and oriented to person, place, and time. No cranial nerve deficit.  Coordination normal.    CBC    Component Value Date/Time   WBC 6.2 07/12/2013 1253   RBC 4.72 07/12/2013 1253   HGB 13.6 07/12/2013 1253   HCT 39.3 07/12/2013 1253   PLT 318 07/12/2013 1253   MCV 83.3 07/12/2013 1253   LYMPHSABS 2.3 07/12/2013 1253   MONOABS 0.6 07/12/2013 1253   EOSABS 0.2 07/12/2013 1253   BASOSABS 0.0 07/12/2013 1253    CMP     Component Value Date/Time   NA 140 07/12/2013 1253   K 4.4 07/12/2013 1253   CL 102 07/12/2013 1253   CO2 29 07/12/2013 1253   GLUCOSE 84 07/12/2013 1253   BUN 10 07/12/2013 1253   CREATININE 0.57 07/12/2013 1253   CREATININE 0.57 11/06/2012 2355   CALCIUM 9.6 07/12/2013 1253   PROT 7.5 07/12/2013 1253   ALBUMIN 4.5 07/12/2013 1253   AST 33 07/12/2013 1253   ALT 44* 07/12/2013 1253   ALKPHOS 101 07/12/2013 1253   BILITOT 0.7 07/12/2013 1253   GFRNONAA >89 07/12/2013 1253   GFRNONAA >90 11/06/2012 2355   GFRAA >89 07/12/2013 1253   GFRAA >90 11/06/2012 2355    Lab Results  Component Value Date/Time   CHOL 213* 02/26/2014 10:31 AM    Lab Results  Component Value Date/Time   HGBA1C 6.4* 11/06/2012 11:55 PM    Lab Results  Component Value Date/Time   AST 33 07/12/2013 12:53 PM    Assessment and Plan  Chest pain, unspecified chest pain type - Plan: EKG 12-Lead with some T wave flattening, normal sinus rhythm,does not have any chest pain currently, ? Stress-related and is also chest wall tenderness, likely atypical pain, patient had echocardiogram in the past with normal EF, will check troponin level. Advise patient to get immediate medical attention if she has any worsening of the symptoms, she understands verbalized instructions, patient is also taking aspirin.  COMPLETE METABOLIC PANEL WITH GFR, Troponin I  Neck pain - Plan: meloxicam (MOBIC) 7.5 MG tablet  Muscle spasms of neck - Plan: cyclobenzaprine (FLEXERIL) 5 MG tablet  Hyperlipidemia - Plan: Currently patient is on Zocor 40 mg, recheck Lipid  panel  Family history of diabetes mellitus (DM) - Plan: COMPLETE METABOLIC PANEL WITH GFR, Hemoglobin A1c    Return in about 3 months (around 09/04/2014), or if symptoms worsen or fail to improve, for hyperipidemia.   This note has been created with Dragon speech  Land. Any transcriptional errors are unintentional.    Lorayne Marek, MD

## 2014-06-04 NOTE — Progress Notes (Signed)
Patient here with interpreter Patient complains of chest pain that started about three weeks ago And radiates up the left side of her neck Patient also complains of headaches when she wakes up And pain to her right leg

## 2014-06-05 ENCOUNTER — Telehealth: Payer: Self-pay

## 2014-06-05 LAB — TROPONIN I

## 2014-06-05 MED ORDER — METFORMIN HCL 500 MG PO TABS: 500.0000 mg | ORAL_TABLET | Freq: Two times a day (BID) | ORAL | Status: DC

## 2014-06-05 NOTE — Telephone Encounter (Signed)
-----   Message from Doris Cheadleeepak Advani, MD sent at 06/05/2014  9:38 AM EDT ----- Call and let the patient know that her cholesterol is improved  but still has elevated triglycerides, advise patient for low fat diet. Her hemoglobin A1c has trended up and now 7.4%, patient has diabetes, advise patient for low carbohydrate diet, start taking metformin 500 mg twice a day, will recheck A1c in 3 months.

## 2014-06-05 NOTE — Telephone Encounter (Signed)
Pt returning call, pelase f/u with pt.

## 2014-06-05 NOTE — Telephone Encounter (Signed)
In house interpreter used-[name] [name] Patient not available Message left on voice mail to return our call Prescription sent to pharmacy

## 2014-06-06 NOTE — Telephone Encounter (Signed)
Patient is returning phone call from nurse, please f/u °

## 2014-06-10 ENCOUNTER — Telehealth: Payer: Self-pay

## 2014-06-10 NOTE — Telephone Encounter (Signed)
Pt returning nurse's call. Please f/u with pt.  °

## 2014-06-10 NOTE — Telephone Encounter (Signed)
Interpreter used-[name] [name] Returned patient phone call Patient not available message left on voice mail to return our call

## 2014-06-10 NOTE — Telephone Encounter (Signed)
Patient came into office requesting to review blood work results. Please f/u with patient

## 2014-06-26 NOTE — Telephone Encounter (Signed)
Pt calling with updated phone number, please f/u with pt results.  Pt's preferred number [phone]

## 2014-07-08 ENCOUNTER — Encounter: Payer: Self-pay | Admitting: Internal Medicine

## 2014-07-08 ENCOUNTER — Ambulatory Visit: Payer: Self-pay | Attending: Internal Medicine | Admitting: Internal Medicine

## 2014-07-08 VITALS — BP 123/74 | HR 89 | Temp 98.0°F | Resp 16 | Wt 203.8 lb

## 2014-07-08 DIAGNOSIS — M62830 Muscle spasm of back: Secondary | ICD-10-CM

## 2014-07-08 MED ORDER — MELOXICAM 7.5 MG PO TABS: 7.5000 mg | ORAL_TABLET | Freq: Every day | ORAL | Status: DC

## 2014-07-08 MED ORDER — CYCLOBENZAPRINE HCL 5 MG PO TABS: 5.0000 mg | ORAL_TABLET | Freq: Every day | ORAL | Status: DC

## 2014-07-08 NOTE — Progress Notes (Signed)
Patient complains mid line back pain that she has had for a while Patient denies any injury Patient states she has not taken anything for her pain

## 2014-07-08 NOTE — Progress Notes (Signed)
MRN: [id number] Name: [name] [name]  Sex: female Age: 54 y.o. DOB: 01/25/1961  Allergies: Review of patient's allergies indicates no known allergies.  Chief Complaint  Patient presents with  . Back Pain    HPI: Patient is 54 y.o. female who comes today complaining of right-sided midback pain on and off for the last 2-3 weeks denies any fall or trauma denies any fever chills, patient has not taken any medication to help her with the symptoms denies any chest pain or shortness of breath, pain is nonradiating dull worse with certain movements. Patient denies any bowel or urinary symptoms.  Past Medical History  Diagnosis Date  . Medical history non-contributory     Past Surgical History  Procedure Laterality Date  . No past surgeries    . Tubal ligation        Medication List       This list is accurate as of: 07/08/14 12:24 PM.  Always use your most recent med list.               aspirin 325 MG tablet  Take 1 tablet (325 mg total) by mouth daily.     cetirizine 10 MG tablet  Commonly known as:  ZYRTEC  Take 1 tablet (10 mg total) by mouth daily.     cyclobenzaprine 5 MG tablet  Commonly known as:  FLEXERIL  Take 1 tablet (5 mg total) by mouth at bedtime.     fluconazole 150 MG tablet  Commonly known as:  DIFLUCAN  Take 1 tablet (150 mg total) by mouth once.     meloxicam 7.5 MG tablet  Commonly known as:  MOBIC  Take 1 tablet (7.5 mg total) by mouth daily.     metFORMIN 500 MG tablet  Commonly known as:  GLUCOPHAGE  Take 1 tablet (500 mg total) by mouth 2 (two) times daily with a meal.     metroNIDAZOLE 500 MG tablet  Commonly known as:  FLAGYL  Take 1 tablet (500 mg total) by mouth 2 (two) times daily.     omeprazole 20 MG capsule  Commonly known as:  PRILOSEC  Take 1 capsule (20 mg total) by mouth daily.     Polyethyl Glycol-Propyl Glycol 0.4-0.3 % Gel  Place 2 drops into the left eye 2 (two) times daily as needed (irratiation of  eyes).     simvastatin 40 MG tablet  Commonly known as:  ZOCOR  Take 1 tablet (40 mg total) by mouth at bedtime.     Vitamin D (Ergocalciferol) 50000 UNITS Caps capsule  Commonly known as:  DRISDOL  Take 1 capsule (50,000 Units total) by mouth every 7 (seven) days.        Meds ordered this encounter  Medications  . meloxicam (MOBIC) 7.5 MG tablet    Sig: Take 1 tablet (7.5 mg total) by mouth daily.    Dispense:  30 tablet    Refill:  2  . cyclobenzaprine (FLEXERIL) 5 MG tablet    Sig: Take 1 tablet (5 mg total) by mouth at bedtime.    Dispense:  30 tablet    Refill:  1    Immunization History  Administered Date(s) Administered  . Influenza,inj,Quad PF,36+ Mos 11/07/2012, 11/04/2013    Family History  Problem Relation Age of Onset  . Hypertension Mother   . Diabetes Mother   . Diabetes Father     History  Substance Use Topics  . Smoking status: Never Smoker   . Smokeless  tobacco: Never Used  . Alcohol Use: No    Review of Systems   As noted in HPI  Filed Vitals:   07/08/14 1139  BP: 123/74  Pulse: 89  Temp: 98 F (36.7 C)  Resp: 16    Physical Exam  Physical Exam  Cardiovascular: Normal rate and regular rhythm.   Pulmonary/Chest: Breath sounds normal. No respiratory distress. She has no wheezes. She has no rales.  Musculoskeletal:  Midthoracic paraspinal tenderness below the tip of right scapular bone, right shoulder good range of motion, equal strength both upper extremities    CBC    Component Value Date/Time   WBC 6.2 07/12/2013 1253   RBC 4.72 07/12/2013 1253   HGB 13.6 07/12/2013 1253   HCT 39.3 07/12/2013 1253   PLT 318 07/12/2013 1253   MCV 83.3 07/12/2013 1253   LYMPHSABS 2.3 07/12/2013 1253   MONOABS 0.6 07/12/2013 1253   EOSABS 0.2 07/12/2013 1253   BASOSABS 0.0 07/12/2013 1253    CMP     Component Value Date/Time   NA 140 06/04/2014 1055   K 4.0 06/04/2014 1055   CL 105 06/04/2014 1055   CO2 27 06/04/2014 1055    GLUCOSE 128* 06/04/2014 1055   BUN 10 06/04/2014 1055   CREATININE 0.49* 06/04/2014 1055   CREATININE 0.57 11/06/2012 2355   CALCIUM 9.7 06/04/2014 1055   PROT 7.4 06/04/2014 1055   ALBUMIN 4.4 06/04/2014 1055   AST 26 06/04/2014 1055   ALT 35 06/04/2014 1055   ALKPHOS 90 06/04/2014 1055   BILITOT 0.9 06/04/2014 1055   GFRNONAA >89 06/04/2014 1055   GFRNONAA >90 11/06/2012 2355   GFRAA >89 06/04/2014 1055   GFRAA >90 11/06/2012 2355    Lab Results  Component Value Date/Time   CHOL 179 06/04/2014 10:55 AM    Lab Results  Component Value Date/Time   HGBA1C 7.4* 06/04/2014 10:55 AM    Lab Results  Component Value Date/Time   AST 26 06/04/2014 10:55 AM    Assessment and Plan  Paraspinal muscle spasm - Plan:  Advised patient to apply heating pad meloxicam (MOBIC) 7.5 MG tablet, cyclobenzaprine (FLEXERIL) 5 MG tablet    Return in about 3 months (around 10/08/2014), or if symptoms worsen or fail to improve.   This note has been created with Education officer, environmental. Any transcriptional errors are unintentional.    Doris Cheadle, MD

## 2014-07-28 NOTE — Telephone Encounter (Signed)
Pt was seen in the office on 07/08/2014

## 2014-10-21 ENCOUNTER — Encounter: Payer: Self-pay | Admitting: Family Medicine

## 2014-10-21 ENCOUNTER — Ambulatory Visit: Payer: Self-pay | Attending: Family Medicine | Admitting: Family Medicine

## 2014-10-21 VITALS — BP 140/76 | HR 80 | Temp 98.8°F | Resp 16 | Ht 62.0 in | Wt 200.4 lb

## 2014-10-21 DIAGNOSIS — M62830 Muscle spasm of back: Secondary | ICD-10-CM

## 2014-10-21 DIAGNOSIS — Z79899 Other long term (current) drug therapy: Secondary | ICD-10-CM | POA: Insufficient documentation

## 2014-10-21 DIAGNOSIS — Z1159 Encounter for screening for other viral diseases: Secondary | ICD-10-CM

## 2014-10-21 DIAGNOSIS — Z9109 Other allergy status, other than to drugs and biological substances: Secondary | ICD-10-CM

## 2014-10-21 DIAGNOSIS — R030 Elevated blood-pressure reading, without diagnosis of hypertension: Secondary | ICD-10-CM | POA: Insufficient documentation

## 2014-10-21 DIAGNOSIS — IMO0001 Reserved for inherently not codable concepts without codable children: Secondary | ICD-10-CM

## 2014-10-21 DIAGNOSIS — E119 Type 2 diabetes mellitus without complications: Secondary | ICD-10-CM | POA: Insufficient documentation

## 2014-10-21 DIAGNOSIS — Z0000 Encounter for general adult medical examination without abnormal findings: Secondary | ICD-10-CM

## 2014-10-21 DIAGNOSIS — IMO0002 Reserved for concepts with insufficient information to code with codable children: Secondary | ICD-10-CM | POA: Insufficient documentation

## 2014-10-21 DIAGNOSIS — H578 Other specified disorders of eye and adnexa: Secondary | ICD-10-CM | POA: Insufficient documentation

## 2014-10-21 DIAGNOSIS — Z7984 Long term (current) use of oral hypoglycemic drugs: Secondary | ICD-10-CM | POA: Insufficient documentation

## 2014-10-21 DIAGNOSIS — Z114 Encounter for screening for human immunodeficiency virus [HIV]: Secondary | ICD-10-CM

## 2014-10-21 DIAGNOSIS — Z23 Encounter for immunization: Secondary | ICD-10-CM | POA: Insufficient documentation

## 2014-10-21 DIAGNOSIS — Z91048 Other nonmedicinal substance allergy status: Secondary | ICD-10-CM

## 2014-10-21 DIAGNOSIS — H5713 Ocular pain, bilateral: Secondary | ICD-10-CM | POA: Insufficient documentation

## 2014-10-21 DIAGNOSIS — E7800 Pure hypercholesterolemia, unspecified: Secondary | ICD-10-CM | POA: Insufficient documentation

## 2014-10-21 DIAGNOSIS — R1013 Epigastric pain: Secondary | ICD-10-CM | POA: Insufficient documentation

## 2014-10-21 DIAGNOSIS — Z7982 Long term (current) use of aspirin: Secondary | ICD-10-CM | POA: Insufficient documentation

## 2014-10-21 LAB — POCT GLYCOSYLATED HEMOGLOBIN (HGB A1C): HEMOGLOBIN A1C: 6.8

## 2014-10-21 LAB — GLUCOSE, POCT (MANUAL RESULT ENTRY): POC Glucose: 175 mg/dl — AB (ref 70–99)

## 2014-10-21 MED ORDER — CETIRIZINE HCL 10 MG PO TABS: 10.0000 mg | ORAL_TABLET | Freq: Every day | ORAL | Status: DC

## 2014-10-21 MED ORDER — CYCLOBENZAPRINE HCL 5 MG PO TABS: 5.0000 mg | ORAL_TABLET | Freq: Every day | ORAL | Status: DC

## 2014-10-21 MED ORDER — METFORMIN HCL 500 MG PO TABS: 500.0000 mg | ORAL_TABLET | Freq: Two times a day (BID) | ORAL | Status: DC

## 2014-10-21 MED ORDER — MELOXICAM 7.5 MG PO TABS: 7.5000 mg | ORAL_TABLET | Freq: Every day | ORAL | Status: DC | PRN

## 2014-10-21 MED ORDER — SIMVASTATIN 40 MG PO TABS: 40.0000 mg | ORAL_TABLET | Freq: Every day | ORAL | Status: DC

## 2014-10-21 MED ORDER — OMEPRAZOLE 20 MG PO CPDR: 20.0000 mg | DELAYED_RELEASE_CAPSULE | Freq: Every day | ORAL | Status: DC
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
## 2014-10-21 NOTE — Patient Instructions (Addendum)
Callen was seen today for eye pain.  Diagnoses and all orders for this visit:  Controlled type 2 diabetes mellitus without complication, without long-term current use of insulin (HCC) -     Glucose (CBG) -     HgB A1c -     Microalbumin/Creatinine Ratio, Urine -     metFORMIN (GLUCOPHAGE) 500 MG tablet; Take 1 tablet (500 mg total) by mouth 2 (two) times daily with a meal. -     Ambulatory referral to Ophthalmology -     Basic Metabolic Panel  Healthcare maintenance -     Flu Vaccine QUAD 36+ mos PF IM (Fluarix & Fluzone Quad PF)  Environmental allergies -     cetirizine (ZYRTEC) 10 MG tablet; Take 1 tablet (10 mg total) by mouth daily.  Paraspinal muscle spasm -     cyclobenzaprine (FLEXERIL) 5 MG tablet; Take 1 tablet (5 mg total) by mouth at bedtime. -     meloxicam (MOBIC) 7.5 MG tablet; Take 1 tablet (7.5 mg total) by mouth daily as needed for pain.  Dyspepsia -     omeprazole (PRILOSEC) 20 MG capsule; Take 1 capsule (20 mg total) by mouth daily.  High cholesterol -     simvastatin (ZOCOR) 40 MG tablet; Take 1 tablet (40 mg total) by mouth at bedtime.  Screening for HIV (human immunodeficiency virus) -     HIV antibody (with reflex)  Need for hepatitis C screening test -     Hepatitis C antibody, reflex   F/u in 3 months for diabetes   Dr. Armen Pickup

## 2014-10-21 NOTE — Assessment & Plan Note (Signed)
A; elevated BP in setting of diabetes P: Check urine microalbumin and BMP  Plan to initiate ACE/ARB

## 2014-10-22 ENCOUNTER — Telehealth: Payer: Self-pay | Admitting: *Deleted

## 2014-10-22 LAB — BASIC METABOLIC PANEL
BUN: 12 mg/dL (ref 7–25)
CALCIUM: 9.6 mg/dL (ref 8.6–10.4)
CO2: 28 mmol/L (ref 20–31)
Chloride: 103 mmol/L (ref 98–110)
Creat: 0.6 mg/dL (ref 0.50–1.05)
Glucose, Bld: 148 mg/dL — ABNORMAL HIGH (ref 65–99)
Potassium: 4.8 mmol/L (ref 3.5–5.3)
SODIUM: 139 mmol/L (ref 135–146)

## 2014-10-22 LAB — HEPATITIS C ANTIBODY: HCV AB: NEGATIVE

## 2014-10-22 LAB — HIV ANTIBODY (ROUTINE TESTING W REFLEX): HIV: NONREACTIVE

## 2014-10-22 LAB — MICROALBUMIN / CREATININE URINE RATIO
Creatinine, Urine: 71.8 mg/dL
Microalb, Ur: <0.2 mg/dL (ref <2.0)

## 2014-10-22 NOTE — Telephone Encounter (Signed)
LVM to return call.

## 2014-10-22 NOTE — Telephone Encounter (Signed)
-----   Message from Dessa Phi, MD sent at 10/22/2014  9:04 AM EDT ----- Screening HIV and Hep C negative Normal urine microalbumin Normal BMP (non-fasting)

## 2014-10-29 NOTE — Telephone Encounter (Signed)
Patient called returning nurses phone call. A good time to call is around 3pm. Due to work early in the day Please follow up.

## 2014-11-10 NOTE — Telephone Encounter (Signed)
Date of birth verified by pt Normal lab results given to Pt Urine Micro, BMP Screening HIV Hep C Pt verbalized understanding

## 2014-11-27 ENCOUNTER — Ambulatory Visit: Payer: Self-pay | Attending: Family Medicine

## 2014-12-05 ENCOUNTER — Encounter: Payer: Self-pay | Admitting: Family Medicine

## 2014-12-05 ENCOUNTER — Ambulatory Visit: Payer: Self-pay | Attending: Family Medicine | Admitting: Family Medicine

## 2014-12-05 VITALS — BP 114/74 | HR 82 | Temp 98.7°F | Resp 16 | Ht 62.0 in | Wt 201.0 lb

## 2014-12-05 DIAGNOSIS — R51 Headache: Secondary | ICD-10-CM | POA: Insufficient documentation

## 2014-12-05 DIAGNOSIS — G8929 Other chronic pain: Secondary | ICD-10-CM | POA: Insufficient documentation

## 2014-12-05 DIAGNOSIS — M545 Low back pain, unspecified: Secondary | ICD-10-CM | POA: Insufficient documentation

## 2014-12-05 DIAGNOSIS — Z79899 Other long term (current) drug therapy: Secondary | ICD-10-CM | POA: Insufficient documentation

## 2014-12-05 DIAGNOSIS — Z23 Encounter for immunization: Secondary | ICD-10-CM

## 2014-12-05 DIAGNOSIS — E119 Type 2 diabetes mellitus without complications: Secondary | ICD-10-CM | POA: Insufficient documentation

## 2014-12-05 DIAGNOSIS — M542 Cervicalgia: Secondary | ICD-10-CM | POA: Insufficient documentation

## 2014-12-05 DIAGNOSIS — Z0000 Encounter for general adult medical examination without abnormal findings: Secondary | ICD-10-CM

## 2014-12-05 DIAGNOSIS — Z7982 Long term (current) use of aspirin: Secondary | ICD-10-CM | POA: Insufficient documentation

## 2014-12-05 DIAGNOSIS — Z7984 Long term (current) use of oral hypoglycemic drugs: Secondary | ICD-10-CM | POA: Insufficient documentation

## 2014-12-05 LAB — GLUCOSE, POCT (MANUAL RESULT ENTRY): POC GLUCOSE: 188 mg/dL — AB (ref 70–99)

## 2014-12-05 MED ORDER — GABAPENTIN 300 MG PO CAPS: 300.0000 mg | ORAL_CAPSULE | Freq: Three times a day (TID) | ORAL | Status: DC

## 2014-12-05 NOTE — Progress Notes (Signed)
Subjective:  Patient ID: [name] [name], female    DOB: 01/25/1961  Age: 54 y.o. MRN: [id number] Spanish interpreter used  CC: Neck Pain   HPI [name] [name] presents for neck pain  1. Neck pain: chronic since 2014. Intermittent. R sided. With occipital headache, behind R eye. Pressure. Associated with blurred vision. No nausea, emesis or fever.   2. Back pain: x 3 months. B/l lumbar. Radiates to groin b/l. No dysuria. No hematuria. Also with calf and thigh pain.     Social History  Substance Use Topics  . Smoking status: Never Smoker   . Smokeless tobacco: Never Used  . Alcohol Use: No    Outpatient Prescriptions Prior to Visit  Medication Sig Dispense Refill  . aspirin 325 MG tablet Take 1 tablet (325 mg total) by mouth daily. 30 tablet 0  . cetirizine (ZYRTEC) 10 MG tablet Take 1 tablet (10 mg total) by mouth daily. 30 tablet 11  . cyclobenzaprine (FLEXERIL) 5 MG tablet Take 1 tablet (5 mg total) by mouth at bedtime. 30 tablet 3  . meloxicam (MOBIC) 7.5 MG tablet Take 1 tablet (7.5 mg total) by mouth daily as needed for pain. 30 tablet 3  . metFORMIN (GLUCOPHAGE) 500 MG tablet Take 1 tablet (500 mg total) by mouth 2 (two) times daily with a meal. 60 tablet 3  . omeprazole (PRILOSEC) 20 MG capsule Take 1 capsule (20 mg total) by mouth daily. 30 capsule 3  . Polyethyl Glycol-Propyl Glycol 0.4-0.3 % GEL Place 2 drops into the left eye 2 (two) times daily as needed (irratiation of eyes).    . simvastatin (ZOCOR) 40 MG tablet Take 1 tablet (40 mg total) by mouth at bedtime. 30 tablet 3   No facility-administered medications prior to visit.    ROS Review of Systems  Constitutional: Negative for fever and chills.  Eyes: Positive for visual disturbance.  Respiratory: Negative for shortness of breath.   Cardiovascular: Negative for chest pain.  Gastrointestinal: Negative for abdominal pain and blood in stool.  Musculoskeletal: Positive for myalgias, back pain  and neck pain. Negative for arthralgias.  Skin: Negative for rash.  Allergic/Immunologic: Negative for immunocompromised state.  Neurological: Positive for headaches.  Hematological: Negative for adenopathy. Does not bruise/bleed easily.  Psychiatric/Behavioral: Negative for suicidal ideas and dysphoric mood.    Objective:  BP 114/74 mmHg  Pulse 82  Temp(Src) 98.7 F (37.1 C) (Oral)  Resp 16  Ht 5\' 2"  (1.575 m)  Wt 201 lb (91.173 kg)  BMI 36.75 kg/m2  SpO2 96%  BP/Weight 12/05/2014 10/21/2014 07/08/2014  Systolic BP 114 140 123  Diastolic BP 74 76 74  Wt. (Lbs) 201 200.4 203.8  BMI 36.75 36.64 39.8   Physical Exam  Constitutional: She is oriented to person, place, and time. She appears well-developed and well-nourished. No distress.  HENT:  Head: Normocephalic and atraumatic.  Cardiovascular: Normal rate, regular rhythm, normal heart sounds and intact distal pulses.   Pulmonary/Chest: Effort normal and breath sounds normal.  Musculoskeletal: She exhibits no edema.  Neurological: She is alert and oriented to person, place, and time.  Skin: Skin is warm and dry. No rash noted.  Psychiatric: She has a normal mood and affect.   Lab Results  Component Value Date   HGBA1C 6.80 10/21/2014   CBG 188  Assessment & Plan:   Problem List Items Addressed This Visit    Diabetes type 2, controlled (HCC) - Primary (Chronic)   Relevant Orders   POCT glucose (manual  entry) (Completed)   Low back pain    A: low back pain w/o radiculopathy. Myalgia from chronic pain syndrome, ? Statin induced P: Gabapentin Trial off simvastatin       Relevant Medications   gabapentin (NEURONTIN) 300 MG capsule   Neck pain    A: chronic neck pain with headache, ddx fibromyalgia, occipital neuralgia, patient with multiple pain complaints  P: Gabapentin       Relevant Medications   gabapentin (NEURONTIN) 300 MG capsule    Other Visit Diagnoses    Healthcare maintenance        Relevant  Orders    Ambulatory referral to Gastroenterology       No orders of the defined types were placed in this encounter.    Follow-up: No Follow-up on file.   Dessa Phi MD

## 2014-12-05 NOTE — Assessment & Plan Note (Signed)
A: low back pain w/o radiculopathy. Myalgia from chronic pain syndrome, ? Statin induced P: Gabapentin Trial off simvastatin

## 2014-12-05 NOTE — Patient Instructions (Addendum)
Ms. Ardyth Harps,  Thank you for coming in today.  For headache, neck pain, back pain  Start gabapentin 300 mg at night for one week, 300 mg twice daily for one week, then 300 mg three times a day.   Do not take simvastatin for next 4 weeks.   F/u in 4 weeks for headache, back pain, muscle aches   Dr. Armen Pickup     Sndrome de dolor miofascial y fibromialgia (Myofascial Pain Syndrome and Fibromyalgia) El sndrome de dolor miofascial y la fibromialgia son trastornos del Engineer, mining. Este dolor puede sentirse, principalmente, en los msculos.   Sndrome de dolor miofascial:  Siempre cursa con puntos neurlgicos o puntos dolorosos a la palpacin en el msculo, que causarn dolor ante la compresin. El dolor puede aparecer y Geneticist, molecular.  Generalmente, afecta el cuello, la parte superior de la espalda y las zonas de los hombros. El dolor suele irradiarse a los brazos y [place].  Fibromialgia:  Cursa con dolores musculares y dolor a la palpacin que aparecen y desaparecen.  Suele asociarse con la fatiga y los trastornos del sueo.  Cursa con puntos neurlgicos.  Suele ser de Set designer duracin (crnica), pero no es potencialmente mortal. La fibromialgia y el dolor miofascial no son lo mismo. Sin embargo, suelen presentarse juntos. Si tiene ambas afecciones, pueden intensificarse entre s. Ambas afecciones son frecuentes y pueden causar bastante dolor y fatiga como para dificultar las actividades cotidianas.  CAUSAS  No se conocen las causas exactas de la fibromialgia y Chief Technology Officer miofascial. Las personas con determinados tipos de genes pueden tener ms probabilidades de Environmental education officer fibromialgia. Algunos factores pueden desencadenar ambas afecciones, por ejemplo:   Dolores de columna.  Artritis.  Lesin grave (traumatismo) y otros factores estresantes fsicos.  Estar bajo mucho estrs.  Una enfermedad. SIGNOS Y SNTOMAS  Fibromialgia El sntoma principal de la fibromialgia es el dolor  ampliamente distribuido y Chief Technology Officer a la palpacin de los msculos. Esto puede variar con [employer]. A veces, el dolor se describe como punzante, fulgurante o urente. Tambin puede tener hormigueo o adormecimiento, problemas para dormir y Management consultant. Tal vez se despierte cansado y atontado (disfuncin cognitiva). Otros sntomas pueden ser los siguientes:   Problemas de intestino y vejiga.  Dolores de [place].  Tiene problemas visuales.  Problemas con los aromas y los ruidos.  Depresin o cambios en el estado de nimo.  Menstruaciones dolorosas (dismenorrea).  Sequedad de la piel o los ojos. Sndrome de [employer] Los sntomas del sndrome de dolor miofascial incluyen lo siguiente:   Bandas musculares tensas y fibrosas.  Sensaciones molestas en las zonas musculares, por ejemplo:  Dolor.  [place].  Quemazn.  Entumecimiento.  Hormigueo.  Debilidad muscular.  Dificultad para mover libremente determinados msculos (amplitud de movimiento). DIAGNSTICO  No hay estudios especficos para diagnosticar la fibromialgia o el sndrome de dolor miofascial. Ambas afecciones pueden ser difciles de diagnosticar porque tienen sntomas que son frecuentes en muchas otras enfermedades. El mdico puede sospechar la presencia de una o ambas afecciones en funcin de los sntomas y la historia clnica. Tambin Civil engineer, contracting un examen fsico.  La clave para diagnosticar la fibromialgia es Surveyor, mining, fatiga y otros sntomas durante ms de tres meses que otra enfermedad no explica.  La clave para diagnosticar el sndrome de dolor miofascial es Clinical research associate los puntos neurlgicos en los msculos que son dolorosos a la palpacin y Teaching laboratory technician en cualquier otra parte del cuerpo (dolor referido). TRATAMIENTO  El tratamiento para la fibromialgia y [place]  sndrome de dolor miofascial a menudo requiere la participacin de un equipo de mdicos. Generalmente, comienza con el mdico de cabecera y un fisioterapeuta.  Es posible que tambin le resulte til trabajar con profesionales alternativos, como masoterapeutas o acupunturistas. El tratamiento para la fibromialgia puede incluir medicamentos, entre ellos, antiinflamatorios no esteroides (AINE) junto con otros frmacos.  El tratamiento para el dolor miofascial tambin puede incluir lo siguiente:  Antiinflamatorios no esteroides.  Relajacin y elongacin de los msculos.  Inyecciones en los puntos neurlgicos.  Tratamientos con ondas de sonido (ultrasonido) para CBS Corporationestimular los msculos. INSTRUCCIONES PARA EL CUIDADO EN EL HOGAR   Tome los medicamentos solamente como se lo haya indicado el mdico.  Haga ejercicio como se lo haya indicado el fisioterapeuta o su mdico.  Tratar de evitar las situaciones estresantes.  Practique tcnicas de relajacin para mantener el estrs bajo control. Tal vez deba intentar lo siguiente:  Biorretroalimentacin.  Formacin de imgenes visuales.  Hipnosis.  Relajacin muscular.  Yoga.  Meditacin.  Hable con el Enterprise Productsmdico sobre los tratamientos alternativos, por ejemplo, acupuntura o Hillsboromasajes.  Lleve un estilo de vida saludable. Esto incluye consumir una dieta saludable y dormir lo suficiente.  Considere la posibilidad de Advertising account plannerparticipar en un grupo de apoyo.  No realice actividades que le generen tensin o sobrecarga muscular. Eso incluye hacer movimientos repetitivos y levantar objetos pesados. SOLICITE ATENCIN MDICA SI:   Aparecen nuevos sntomas.  Los sntomas empeoran.  Los Toys ''R'' Usmedicamentos le causan BB&T Corporationefectos adversos.  Tiene dificultad para dormir.  La afeccin le causa depresin o ansiedad. Irven ShellingPARA OBTENER MS INFORMACIN   Asociacin Nacional de Fibromialgia (National Fibromyalgia Association): http://www.fmaware.orgwww.fmaware.org  Fundacin contra la Artritis (Arthritis Foundation): http://www.arthritis.orgwww.arthritis.org  Asociacin Estadounidense del Dolor Crnico (American Chronic Pain  Association): michaeledo.comhttp://www.theacpa.org/condition/myofascial-painwww.CandyDash.co.zatheacpa.org/condition/myofascial-pain   Esta informacin no tiene Theme park managercomo fin reemplazar el consejo del mdico. Asegrese de hacerle al mdico cualquier pregunta que tenga.   Document Released: 01/03/2005 Document Revised: 01/24/2014 Elsevier Interactive Patient Education [employer]2016 Elsevier Inc.

## 2014-12-05 NOTE — Progress Notes (Signed)
C/C neck pain no Hx injury  Pain scale #5 No tobacco user  No suicide thought in the past two weeks

## 2014-12-05 NOTE — Assessment & Plan Note (Signed)
A: chronic neck pain with headache, ddx fibromyalgia, occipital neuralgia, patient with multiple pain complaints  P: Gabapentin

## 2014-12-17 ENCOUNTER — Encounter: Payer: Self-pay | Admitting: Internal Medicine

## 2015-01-08 ENCOUNTER — Other Ambulatory Visit: Payer: Self-pay

## 2015-01-08 DIAGNOSIS — Z1231 Encounter for screening mammogram for malignant neoplasm of breast: Secondary | ICD-10-CM

## 2015-01-30 ENCOUNTER — Telehealth: Payer: Self-pay | Admitting: Family Medicine

## 2015-01-30 DIAGNOSIS — R1013 Epigastric pain: Secondary | ICD-10-CM

## 2015-01-30 NOTE — Telephone Encounter (Signed)
Pharmacy faxed request for medication refill... Omeprazole 20 mg capsule.Marland Kitchen.Marland Kitchen.Marland Kitchen.Marland Kitchen.[facility] [address]

## 2015-02-02 ENCOUNTER — Ambulatory Visit (AMBULATORY_SURGERY_CENTER): Payer: Self-pay

## 2015-02-02 VITALS — Ht 59.5 in | Wt 200.0 lb

## 2015-02-02 DIAGNOSIS — Z1211 Encounter for screening for malignant neoplasm of colon: Secondary | ICD-10-CM

## 2015-02-02 MED ORDER — OMEPRAZOLE 20 MG PO CPDR: 20.0000 mg | DELAYED_RELEASE_CAPSULE | Freq: Every day | ORAL | Status: DC

## 2015-02-02 NOTE — Progress Notes (Signed)
No allergies to eggs or soy No home oxygen No diet/weight loss meds No past problems with anesthesia  Has email and internet; registered emmi 

## 2015-02-02 NOTE — Telephone Encounter (Signed)
[facility]

## 2015-02-03 ENCOUNTER — Ambulatory Visit
Admission: RE | Admit: 2015-02-03 | Discharge: 2015-02-03 | Disposition: A | Discharge status: Home / Self Care | Payer: No Typology Code available for payment source | Source: Ambulatory Visit

## 2015-02-03 DIAGNOSIS — Z1231 Encounter for screening mammogram for malignant neoplasm of breast: Secondary | ICD-10-CM

## 2015-02-03 MED FILL — ?METFORMIN HCL 500MG TABLET: 500 | 30 days supply | Qty: 60 | Fill #2

## 2015-02-16 ENCOUNTER — Encounter: Payer: Self-pay | Admitting: Internal Medicine

## 2015-02-16 ENCOUNTER — Ambulatory Visit (AMBULATORY_SURGERY_CENTER): Payer: Self-pay | Admitting: Internal Medicine

## 2015-02-16 VITALS — BP 118/72 | HR 63 | Temp 98.6°F | Resp 20 | Ht 59.0 in | Wt 200.0 lb

## 2015-02-16 DIAGNOSIS — Z1211 Encounter for screening for malignant neoplasm of colon: Secondary | ICD-10-CM

## 2015-02-16 LAB — GLUCOSE, CAPILLARY
GLUCOSE-CAPILLARY: 81 mg/dL (ref 65–99)
Glucose-Capillary: 91 mg/dL (ref 65–99)

## 2015-02-16 MED ORDER — SODIUM CHLORIDE 0.9 % IV SOLN: 500.0000 mL | INTRAVENOUS | Status: DC

## 2015-02-16 NOTE — Progress Notes (Signed)
To recovery, report to Willis, RN, VSS 

## 2015-02-16 NOTE — Patient Instructions (Signed)
YOU HAD AN ENDOSCOPIC PROCEDURE TODAY AT THE [facility]:   Refer to the procedure report that was given to you for any specific questions about what was found during the examination.  If the procedure report does not answer your questions, please call your gastroenterologist to clarify.  If you requested that your care partner not be given the details of your procedure findings, then the procedure report has been included in a sealed envelope for you to review at your convenience later.  YOU SHOULD EXPECT: Some feelings of bloating in the abdomen. Passage of more gas than usual.  Walking can help get rid of the air that was put into your GI tract during the procedure and reduce the bloating. If you had a lower endoscopy (such as a colonoscopy or flexible sigmoidoscopy) you may notice spotting of blood in your stool or on the toilet paper. If you underwent a bowel prep for your procedure, you may not have a normal bowel movement for a few days.  Please Note:  You might notice some irritation and congestion in your nose or some drainage.  This is from the oxygen used during your procedure.  There is no need for concern and it should clear up in a day or so.  SYMPTOMS TO REPORT IMMEDIATELY:   Following lower endoscopy (colonoscopy or flexible sigmoidoscopy):  Excessive amounts of blood in the stool  Significant tenderness or worsening of abdominal pains  Swelling of the abdomen that is new, acute  Fever of 100F or higher  For urgent or emergent issues, a gastroenterologist can be reached at any hour by calling [phone].   DIET: Your first meal following the procedure should be a small meal and then it is ok to progress to your normal diet. Heavy or fried foods are harder to digest and may make you feel nauseous or bloated.  Likewise, meals heavy in dairy and vegetables can increase bloating.  Drink plenty of fluids but you should avoid alcoholic beverages for 24  hours.  ACTIVITY:  You should plan to take it easy for the rest of today and you should NOT DRIVE or use heavy machinery until tomorrow (because of the sedation medicines used during the test).    FOLLOW UP: Our staff will call the number listed on your records the next business day following your procedure to check on you and address any questions or concerns that you may have regarding the information given to you following your procedure. If we do not reach you, we will leave a message.  However, if you are feeling well and you are not experiencing any problems, there is no need to return our call.  We will assume that you have returned to your regular daily activities without incident.  If any biopsies were taken you will be contacted by phone or by letter within the next 1-3 weeks.  Please call us at [phone] if you have not heard about the biopsies in 3 weeks.    SIGNATURES/CONFIDENTIALITY: You and/or your care partner have signed paperwork which will be entered into your electronic medical record.  These signatures attest to the fact that that the information above on your After Visit Summary has been reviewed and is understood.  Full responsibility of the confidentiality of this discharge information lies with you and/or your care-partner.    Handouts were given to your care partner on diverticulosis in spanish and english. Your blood sugar was 81 in the recovery room.  You may resume your current medications today. Please call if any questions or concerns.

## 2015-02-16 NOTE — Progress Notes (Signed)
[name] [name], interpreter from [place] helped pt and her husband with spanish discharge instructions.  Also Pt's husband will answer his phone tomorrow for the follow up call and will put his son on the line, who speaks english.  No problems noted on discharge. maw

## 2015-02-16 NOTE — Op Note (Signed)
[facility] [address] [place], [address]   COLONOSCOPY PROCEDURE REPORT  PATIENT: [name] [name]  MR#: [id number] BIRTHDATE: 01/25/1961 , 54  yrs. old GENDER: female ENDOSCOPIST: Iva Boop, MD, [facility] PROCEDURE DATE:  02/16/2015 PROCEDURE:   Colonoscopy, screening First Screening Colonoscopy - Avg.  risk and is 50 yrs.  old or older Yes.  Prior Negative Screening - Now for repeat screening. N/A  History of Adenoma - Now for follow-up colonoscopy & has been > or = to 3 yrs.  N/A  Polyps removed today? No Recommend repeat exam, <10 yrs? No ASA CLASS:   Class III INDICATIONS:Screening for colonic neoplasia and Colorectal Neoplasm Risk Assessment for this procedure is average risk. MEDICATIONS: Propofol 200 mg IV and Monitored anesthesia care  DESCRIPTION OF PROCEDURE:   After the risks benefits and alternatives of the procedure were thoroughly explained, informed consent was obtained.  The digital rectal exam revealed no abnormalities of the rectum.   The [id number]  endoscope was introduced through the anus and advanced to the cecum, which was identified by both the appendix and ileocecal valve. No adverse events experienced.   The quality of the prep was excellent. (MiraLax was used)  The instrument was then slowly withdrawn as the colon was fully examined. Estimated blood loss is zero unless otherwise noted in this procedure report.      COLON FINDINGS: There was severe diverticulosis noted in the left colon.   The examination was otherwise normal.  Retroflexed views revealed no abnormalities. The time to cecum = 2.4 Withdrawal time = 7.5   The scope was withdrawn and the procedure completed. COMPLICATIONS: There were no immediate complications.  ENDOSCOPIC IMPRESSION: 1.   Severe diverticulosis was noted in the left colon 2.   The examination was otherwise normal - excellent prep  RECOMMENDATIONS: Repeat  colonoscopy/screening test 10 years.  2027  eSigned:  Iva Boop, MD, [facility] 02/16/2015 11:20 AM   cc: The Patient and Bernadette Hoit, MD

## 2015-02-17 ENCOUNTER — Telehealth: Payer: Self-pay | Admitting: *Deleted

## 2015-02-17 NOTE — Telephone Encounter (Signed)
  Follow up Call-  Call back number 02/16/2015  Post procedure Call Back phone  # [phone] speak to husband  Permission to leave phone message Yes     Patient questions:  Do you have a fever, pain , or abdominal swelling? No. Pain Score  0 *  Have you tolerated food without any problems? Yes.    Have you been able to return to your normal activities? Yes.    Do you have any questions about your discharge instructions: Diet   No. Medications  No. Follow up visit  No.  Do you have questions or concerns about your Care? No.  Actions: * If pain score is 4 or above: No action needed, pain <4.

## 2015-03-24 MED FILL — ?METFORMIN HCL 500MG TABLET: 500 | 30 days supply | Qty: 60 | Fill #3

## 2015-04-07 MED FILL — ?CETIRIZINE HCL 10 MG TABLE: 10 | 30 days supply | Qty: 30 | Fill #1

## 2015-04-20 ENCOUNTER — Ambulatory Visit: Payer: Self-pay | Admitting: Family Medicine

## 2015-04-20 MED FILL — ?OMEPRAZOLE DR 20 MG CAPSUL: 20 | 30 days supply | Qty: 30 | Fill #1

## 2015-04-27 ENCOUNTER — Ambulatory Visit: Payer: Self-pay | Attending: Family Medicine

## 2015-04-27 MED FILL — ?METFORMIN HCL 500MG TABLET: 500 | 30 days supply | Qty: 60 | Fill #2

## 2015-05-04 MED FILL — ?CETIRIZINE HCL 10 MG TABLE: 10 | 30 days supply | Qty: 30 | Fill #2

## 2015-05-15 ENCOUNTER — Encounter: Payer: Self-pay | Admitting: Family Medicine

## 2015-05-15 ENCOUNTER — Ambulatory Visit: Payer: Self-pay | Attending: Family Medicine | Admitting: Family Medicine

## 2015-05-15 VITALS — BP 124/76 | HR 81 | Temp 98.7°F | Resp 16 | Ht 62.0 in | Wt 200.0 lb

## 2015-05-15 DIAGNOSIS — Z7982 Long term (current) use of aspirin: Secondary | ICD-10-CM | POA: Insufficient documentation

## 2015-05-15 DIAGNOSIS — Z79899 Other long term (current) drug therapy: Secondary | ICD-10-CM | POA: Insufficient documentation

## 2015-05-15 DIAGNOSIS — E785 Hyperlipidemia, unspecified: Secondary | ICD-10-CM | POA: Insufficient documentation

## 2015-05-15 DIAGNOSIS — H5711 Ocular pain, right eye: Secondary | ICD-10-CM | POA: Insufficient documentation

## 2015-05-15 DIAGNOSIS — E119 Type 2 diabetes mellitus without complications: Secondary | ICD-10-CM | POA: Insufficient documentation

## 2015-05-15 DIAGNOSIS — G510 Bell's palsy: Secondary | ICD-10-CM | POA: Insufficient documentation

## 2015-05-15 DIAGNOSIS — I10 Essential (primary) hypertension: Secondary | ICD-10-CM | POA: Insufficient documentation

## 2015-05-15 DIAGNOSIS — M791 Myalgia, unspecified site: Secondary | ICD-10-CM | POA: Insufficient documentation

## 2015-05-15 DIAGNOSIS — R51 Headache: Secondary | ICD-10-CM | POA: Insufficient documentation

## 2015-05-15 DIAGNOSIS — Z7984 Long term (current) use of oral hypoglycemic drugs: Secondary | ICD-10-CM | POA: Insufficient documentation

## 2015-05-15 LAB — POCT GLYCOSYLATED HEMOGLOBIN (HGB A1C): HEMOGLOBIN A1C: 6.7

## 2015-05-15 LAB — GLUCOSE, POCT (MANUAL RESULT ENTRY): POC GLUCOSE: 181 mg/dL — AB (ref 70–99)

## 2015-05-15 MED ORDER — KETOTIFEN FUMARATE 0.025 % OP SOLN: 1.0000 [drp] | Freq: Two times a day (BID) | OPHTHALMIC | Status: DC

## 2015-05-15 MED ORDER — PRAVASTATIN SODIUM 40 MG PO TABS: 40.0000 mg | ORAL_TABLET | Freq: Every day | ORAL | Status: DC

## 2015-05-15 MED FILL — ?PRAVASTATIN NA 40 MG TAB: 40 MG | 30 days supply | Qty: 30 | Fill #0

## 2015-05-15 NOTE — Progress Notes (Signed)
Subjective:  Patient ID: [name] [name], female    DOB: 01/25/1961  Age: 55 y.o. MRN: [id number]  Spanish interpreter used   CC: Headache   HPI [name] [name] has hx of HTN, diabetes, Bell's Palsy presents for    1. R sided body pain: x 2 weeks. Associated with entire headache. Fatigue. Pain and blurry vision in R eye, dryness of throat. She is taking zyrtec daily. She has taken tylenol for her pain that helps temporarily. During her prescribed trial off simvastatin she had significant reduction in her pain.    2. R eye pain: burning, itching, pain in R eye. Intermittent tearing. No redness. No trauma.    Social History  Substance Use Topics  . Smoking status: Never Smoker   . Smokeless tobacco: Never Used  . Alcohol Use: No    Outpatient Prescriptions Prior to Visit  Medication Sig Dispense Refill  . aspirin 325 MG tablet Take 1 tablet (325 mg total) by mouth daily. 30 tablet 0  . cetirizine (ZYRTEC) 10 MG tablet Take 1 tablet (10 mg total) by mouth daily. 30 tablet 11  . cyclobenzaprine (FLEXERIL) 5 MG tablet Take 1 tablet (5 mg total) by mouth at bedtime. 30 tablet 3  . metFORMIN (GLUCOPHAGE) 500 MG tablet Take 1 tablet (500 mg total) by mouth 2 (two) times daily with a meal. 60 tablet 3  . omeprazole (PRILOSEC) 20 MG capsule Take 1 capsule (20 mg total) by mouth daily. 30 capsule 3  . Polyethyl Glycol-Propyl Glycol 0.4-0.3 % GEL Place 2 drops into the left eye 2 (two) times daily as needed (irratiation of eyes).    . simvastatin (ZOCOR) 40 MG tablet Take 1 tablet (40 mg total) by mouth at bedtime. 30 tablet 3  . gabapentin (NEURONTIN) 300 MG capsule Take 1 capsule (300 mg total) by mouth 3 (three) times daily. (Patient not taking: Reported on 05/15/2015) 90 capsule 1  . meloxicam (MOBIC) 7.5 MG tablet Take 1 tablet (7.5 mg total) by mouth daily as needed for pain. (Patient not taking: Reported on 05/15/2015) 30 tablet 3   No facility-administered  medications prior to visit.    ROS Review of Systems  Constitutional: Negative for fever and chills.  HENT: Positive for sore throat.   Eyes: Positive for pain. Negative for visual disturbance.  Respiratory: Negative for shortness of breath.   Cardiovascular: Negative for chest pain.  Gastrointestinal: Negative for abdominal pain and blood in stool.  Musculoskeletal: Positive for myalgias and arthralgias. Negative for back pain and joint swelling.  Skin: Negative for rash.  Allergic/Immunologic: Negative for immunocompromised state.  Neurological: Positive for headaches.  Hematological: Negative for adenopathy. Does not bruise/bleed easily.  Psychiatric/Behavioral: Negative for suicidal ideas and dysphoric mood.    Objective:  BP 124/76 mmHg  Pulse 81  Temp(Src) 98.7 F (37.1 C) (Oral)  Resp 16  Ht 5\' 2"  (1.575 m)  Wt 200 lb (90.719 kg)  BMI 36.57 kg/m2  SpO2 95%  BP/Weight 05/15/2015 02/16/2015 02/02/2015  Systolic BP 124 118 -  Diastolic BP 76 72 -  Wt. (Lbs) 200 200 200  BMI 36.57 40.37 39.73    Physical Exam  Constitutional: She is oriented to person, place, and time. She appears well-developed and well-nourished. No distress.  HENT:  Head: Normocephalic and atraumatic.  Right Ear: Tympanic membrane, external ear and ear canal normal.  Left Ear: Tympanic membrane, external ear and ear canal normal.  Nose: Mucosal edema present.  Mouth/Throat: Uvula is midline, oropharynx is  clear and moist and mucous membranes are normal.  Cardiovascular: Normal rate, regular rhythm, normal heart sounds and intact distal pulses.   Pulmonary/Chest: Effort normal and breath sounds normal.  Musculoskeletal: She exhibits tenderness. She exhibits no edema.  Neurological: She is alert and oriented to person, place, and time.  Skin: Skin is warm and dry. No rash noted. No erythema. No pallor.  Psychiatric: She has a normal mood and affect.   Lab Results  Component Value Date   HGBA1C  6.7 05/15/2015  CBG 181  Assessment & Plan:   Kiaira was seen today for headache.  Diagnoses and all orders for this visit:  Controlled type 2 diabetes mellitus without complication, without long-term current use of insulin (HCC) -     POCT glycosylated hemoglobin (Hb A1C) -     POCT glucose (manual entry) -     Ambulatory referral to Ophthalmology -     pravastatin (PRAVACHOL) 40 MG tablet; Take 1 tablet (40 mg total) by mouth daily.  Pain in right eye -     Ambulatory referral to Ophthalmology -     ketotifen (ZADITOR) 0.025 % ophthalmic solution; Place 1 drop into the right eye 2 (two) times daily.  Myalgia  Hyperlipidemia -     pravastatin (PRAVACHOL) 40 MG tablet; Take 1 tablet (40 mg total) by mouth daily.    No orders of the defined types were placed in this encounter.    Follow-up: No Follow-up on file.   Dessa Phi MD

## 2015-05-15 NOTE — Patient Instructions (Addendum)
[name] PieriniJuana was seen today for headache.  Diagnoses and all orders for this visit:  Controlled type 2 diabetes mellitus without complication, without long-term current use of insulin (HCC) -     POCT glycosylated hemoglobin (Hb A1C) -     POCT glucose (manual entry) -     Ambulatory referral to Ophthalmology -     pravastatin (PRAVACHOL) 40 MG tablet; Take 1 tablet (40 mg total) by mouth daily.  Pain in right eye -     Ambulatory referral to Ophthalmology -     ketotifen (ZADITOR) 0.025 % ophthalmic solution; Place 1 drop into the right eye 2 (two) times daily.  Myalgia  Hyperlipidemia -     pravastatin (PRAVACHOL) 40 MG tablet; Take 1 tablet (40 mg total) by mouth daily.   F/u in 4-6 weeks for pap smear   Dr. Armen PickupFunches

## 2015-05-15 NOTE — Assessment & Plan Note (Signed)
Normal exam Suspect simvastatin induced myalgia Stop simvastatin Start pravastatin 40 mg daily

## 2015-05-15 NOTE — Assessment & Plan Note (Signed)
Normal exam Diabetes patient Opthalmology referral for diabetic eye exam zatidor eye drops

## 2015-05-15 NOTE — Assessment & Plan Note (Signed)
Well controlled diabetes Continue current regimen  

## 2015-05-15 NOTE — Progress Notes (Signed)
Dry mouth, rt eye blurry, low energy  Stated been having HA and  rt side of body hurt x 2 weeks  No tobacco user  No suicidal thoughts in the past two weeks  Pain scale # 5

## 2015-05-19 ENCOUNTER — Telehealth: Payer: Self-pay | Admitting: Family Medicine

## 2015-05-19 DIAGNOSIS — Z9109 Other allergy status, other than to drugs and biological substances: Secondary | ICD-10-CM

## 2015-05-19 NOTE — Telephone Encounter (Signed)
Patient states that she takes zyrtec twice a day due to allergies, if she only takes it once it doesn't make an effect at all on her allergies... Please follow up with patient to adjust prescription

## 2015-05-21 MED ORDER — CETIRIZINE HCL 10 MG PO TABS: 10.0000 mg | ORAL_TABLET | Freq: Two times a day (BID) | ORAL | Status: DC

## 2015-05-21 NOTE — Telephone Encounter (Signed)
zyrtec dose changed

## 2015-05-22 NOTE — Telephone Encounter (Signed)
Pt. Was informed that Rx was changed. [facility]

## 2015-05-27 MED FILL — ?CETIRIZINE HCL 10 MG TABLE: 10 | 30 days supply | Qty: 30 | Fill #3

## 2015-06-03 MED FILL — ?METFORMIN HCL 500MG TABLET: 500 | 30 days supply | Qty: 60 | Fill #3

## 2015-06-12 ENCOUNTER — Other Ambulatory Visit: Payer: Self-pay | Admitting: Family Medicine

## 2015-06-15 ENCOUNTER — Other Ambulatory Visit: Payer: Self-pay | Admitting: Family Medicine

## 2015-06-19 ENCOUNTER — Encounter: Payer: Self-pay | Admitting: Family Medicine

## 2015-06-19 ENCOUNTER — Ambulatory Visit: Payer: Self-pay | Attending: Family Medicine | Admitting: Family Medicine

## 2015-06-19 VITALS — BP 129/74 | HR 79 | Temp 98.6°F | Resp 16 | Ht 62.0 in | Wt 199.0 lb

## 2015-06-19 DIAGNOSIS — Z01419 Encounter for gynecological examination (general) (routine) without abnormal findings: Secondary | ICD-10-CM | POA: Insufficient documentation

## 2015-06-19 DIAGNOSIS — E669 Obesity, unspecified: Secondary | ICD-10-CM | POA: Insufficient documentation

## 2015-06-19 DIAGNOSIS — Z124 Encounter for screening for malignant neoplasm of cervix: Secondary | ICD-10-CM

## 2015-06-19 DIAGNOSIS — Z7982 Long term (current) use of aspirin: Secondary | ICD-10-CM | POA: Insufficient documentation

## 2015-06-19 DIAGNOSIS — N941 Unspecified dyspareunia: Secondary | ICD-10-CM | POA: Insufficient documentation

## 2015-06-19 DIAGNOSIS — N952 Postmenopausal atrophic vaginitis: Secondary | ICD-10-CM | POA: Insufficient documentation

## 2015-06-19 DIAGNOSIS — Z79899 Other long term (current) drug therapy: Secondary | ICD-10-CM | POA: Insufficient documentation

## 2015-06-19 DIAGNOSIS — Z6836 Body mass index (BMI) 36.0-36.9, adult: Secondary | ICD-10-CM | POA: Insufficient documentation

## 2015-06-19 MED ORDER — ESTRADIOL 0.1 MG/GM VA CREA: TOPICAL_CREAM | VAGINAL | Status: DC

## 2015-06-19 MED FILL — !ESTRACE 0.01% CREAM: 0.01% | 30 days supply | Qty: 72 | Fill #0

## 2015-06-19 NOTE — Progress Notes (Signed)
Subjective:  Patient ID: [name] [name], female    DOB: 01/25/1961  Age: 55 y.o. MRN: [id number]  CC: Gynecologic Exam   HPI [name] [name] presents for    1. Pap: here for routine pap. Reports vaginal dryness and decreased sex drive. Discomfort with sex. No bleeding or rash. Has sex with her husband only.   Social History  Substance Use Topics  . Smoking status: Never Smoker   . Smokeless tobacco: Never Used  . Alcohol Use: No    Outpatient Prescriptions Prior to Visit  Medication Sig Dispense Refill  . aspirin 325 MG tablet Take 1 tablet (325 mg total) by mouth daily. 30 tablet 0  . cetirizine (ZYRTEC) 10 MG tablet Take 1 tablet (10 mg total) by mouth 2 (two) times daily. 60 tablet 11  . ketotifen (ZADITOR) 0.025 % ophthalmic solution Place 1 drop into the right eye 2 (two) times daily. 10 mL 1  . metFORMIN (GLUCOPHAGE) 500 MG tablet Take 1 tablet (500 mg total) by mouth 2 (two) times daily with a meal. 60 tablet 3  . omeprazole (PRILOSEC) 20 MG capsule Take 1 capsule (20 mg total) by mouth daily. 30 capsule 3  . Polyethyl Glycol-Propyl Glycol 0.4-0.3 % GEL Place 2 drops into the left eye 2 (two) times daily as needed (irratiation of eyes).    . pravastatin (PRAVACHOL) 40 MG tablet Take 1 tablet (40 mg total) by mouth daily. 90 tablet 3   No facility-administered medications prior to visit.    ROS Review of Systems  Constitutional: Negative for fever and chills.  Eyes: Negative for visual disturbance.  Respiratory: Negative for shortness of breath.   Cardiovascular: Negative for chest pain.  Gastrointestinal: Negative for abdominal pain and blood in stool.  Genitourinary: Positive for dyspareunia. Negative for vaginal bleeding, vaginal discharge and vaginal pain.  Musculoskeletal: Negative for back pain and arthralgias.  Skin: Negative for rash.  Allergic/Immunologic: Negative for immunocompromised state.  Hematological: Negative for adenopathy.  Does not bruise/bleed easily.  Psychiatric/Behavioral: Negative for suicidal ideas and dysphoric mood.    Objective:  BP 129/74 mmHg  Pulse 79  Temp(Src) 98.6 F (37 C) (Oral)  Resp 16  Ht 5\' 2"  (1.575 m)  Wt 199 lb (90.266 kg)  BMI 36.39 kg/m2  SpO2 96%  BP/Weight 06/19/2015 05/15/2015 02/16/2015  Systolic BP 129 124 118  Diastolic BP 74 76 72  Wt. (Lbs) 199 200 200  BMI 36.39 36.57 40.37    Physical Exam  Constitutional: She appears well-developed and well-nourished. No distress.  Obese   Pulmonary/Chest: Effort normal.  Genitourinary: Vagina normal and uterus normal. Pelvic exam was performed with patient prone. There is no rash, tenderness or lesion on the right labia. There is no rash, tenderness or lesion on the left labia. Cervix exhibits no motion tenderness, no discharge and no friability.  Musculoskeletal: She exhibits no edema.  Lymphadenopathy:       Right: No inguinal adenopathy present.       Left: No inguinal adenopathy present.  Skin: Skin is warm and dry. No rash noted.     Assessment & Plan:   There are no diagnoses linked to this encounter. [name] [name] was seen today for gynecologic exam.  Diagnoses and all orders for this visit:  Pap smear for cervical cancer screening -     Cytology - PAP  Postmenopausal atrophic vaginitis -     estradiol (ESTRACE VAGINAL) 0.1 MG/GM vaginal cream; 1 applicatorful nightly for two weeks then twice weekly  Meds ordered this encounter  Medications  . estradiol (ESTRACE VAGINAL) 0.1 MG/GM vaginal cream    Sig: 1 applicatorful nightly for two weeks then twice weekly    Dispense:  42.5 g    Refill:  12    Follow-up: Return in about 3 months (around 09/19/2015) for diabetes .   Dessa Phi MD

## 2015-06-19 NOTE — Patient Instructions (Addendum)
[name] was seen today for gynecologic exam.  Diagnoses and all orders for this visit:  Pap smear for cervical cancer screening -     Cytology - PAP  Postmenopausal atrophic vaginitis -     estradiol (ESTRACE VAGINAL) 0.1 MG/GM vaginal cream; 1 applicatorful nightly for two weeks then twice weekly   Use Ky Jelly or similar lubricant when having sex.   F/u in 3 months for diabetes  Dr. Armen PickupFunches

## 2015-06-19 NOTE — Progress Notes (Signed)
Gyn exam  Sexually active, sometime pain with intercourse  Pt stated sometime dryness with intercourse  No pain today  No tobacco user  No suicidal thoughts in the past two weeks

## 2015-06-22 LAB — CYTOLOGY - PAP

## 2015-06-22 LAB — CERVICOVAGINAL ANCILLARY ONLY
Chlamydia: NEGATIVE
Neisseria Gonorrhea: NEGATIVE
Wet Prep (BD Affirm): NEGATIVE

## 2015-06-24 ENCOUNTER — Telehealth: Payer: Self-pay | Admitting: *Deleted

## 2015-06-24 NOTE — Telephone Encounter (Signed)
LVM to return call.

## 2015-06-24 NOTE — Telephone Encounter (Signed)
-----   Message from Josalyn Funches, MD sent at 06/22/2015  3:40 PM EDT ----- Negative pap Screening Gc/chlam negative 

## 2015-06-24 NOTE — Telephone Encounter (Signed)
-----   Message from Dessa PhiJosalyn Funches, MD sent at 06/22/2015  5:59 PM EDT ----- Screening wet prep negative

## 2015-06-26 NOTE — Telephone Encounter (Signed)
Patient returned nurse phone call. °Please follow up. °

## 2015-06-29 MED FILL — ?PRAVASTATIN NA 40 MG TAB: 40 MG | 30 days supply | Qty: 30 | Fill #1

## 2015-07-07 ENCOUNTER — Other Ambulatory Visit: Payer: Self-pay | Admitting: Family Medicine

## 2015-07-07 ENCOUNTER — Other Ambulatory Visit: Payer: Self-pay | Admitting: Internal Medicine

## 2015-07-07 DIAGNOSIS — E119 Type 2 diabetes mellitus without complications: Secondary | ICD-10-CM

## 2015-07-07 MED ORDER — METFORMIN HCL 500 MG PO TABS: 500.0000 mg | ORAL_TABLET | Freq: Two times a day (BID) | ORAL | Status: DC

## 2015-07-07 MED FILL — ?OMEPRAZOLE DR 20 MG CAPSUL: 20 | 30 days supply | Qty: 30 | Fill #2

## 2015-07-07 MED FILL — ?METFORMIN HCL 500MG TABLET: 500 | 30 days supply | Qty: 60 | Fill #0

## 2015-07-07 NOTE — Telephone Encounter (Signed)
Patient needs lab results. °Please follow up. ° °

## 2015-07-07 NOTE — Telephone Encounter (Signed)
Patient needs metformin

## 2015-07-07 NOTE — Telephone Encounter (Signed)
Metformin refilled

## 2015-07-07 NOTE — Telephone Encounter (Signed)
Patient is needing metformin. °Please follow up. ° °

## 2015-07-08 NOTE — Telephone Encounter (Signed)
Date of birth verified by pt  Results given  Notified Rx Metformin was send to [facility] pharmacy  Pt verbalized understanding  Information given In Spanish

## 2015-08-10 MED FILL — ?PRAVASTATIN NA 40 MG TAB: 40 MG | 30 days supply | Qty: 30 | Fill #2

## 2015-08-10 MED FILL — metFORMIN HCL 500 MG TABS: 500 | 30 days supply | Qty: 60 | Fill #1

## 2015-08-25 ENCOUNTER — Encounter: Payer: Self-pay | Admitting: Family Medicine

## 2015-08-25 ENCOUNTER — Ambulatory Visit: Payer: Self-pay | Attending: Family Medicine | Admitting: Family Medicine

## 2015-08-25 VITALS — BP 127/68 | HR 94 | Temp 98.2°F | Ht 62.0 in | Wt 202.6 lb

## 2015-08-25 DIAGNOSIS — E119 Type 2 diabetes mellitus without complications: Secondary | ICD-10-CM | POA: Insufficient documentation

## 2015-08-25 DIAGNOSIS — R109 Unspecified abdominal pain: Secondary | ICD-10-CM | POA: Insufficient documentation

## 2015-08-25 DIAGNOSIS — Z79899 Other long term (current) drug therapy: Secondary | ICD-10-CM | POA: Insufficient documentation

## 2015-08-25 DIAGNOSIS — R143 Flatulence: Secondary | ICD-10-CM

## 2015-08-25 DIAGNOSIS — Z7982 Long term (current) use of aspirin: Secondary | ICD-10-CM | POA: Insufficient documentation

## 2015-08-25 DIAGNOSIS — R140 Abdominal distension (gaseous): Secondary | ICD-10-CM | POA: Insufficient documentation

## 2015-08-25 DIAGNOSIS — Z794 Long term (current) use of insulin: Secondary | ICD-10-CM | POA: Insufficient documentation

## 2015-08-25 DIAGNOSIS — H578 Other specified disorders of eye and adnexa: Secondary | ICD-10-CM | POA: Insufficient documentation

## 2015-08-25 DIAGNOSIS — H00012 Hordeolum externum right lower eyelid: Secondary | ICD-10-CM | POA: Insufficient documentation

## 2015-08-25 LAB — POCT GLYCOSYLATED HEMOGLOBIN (HGB A1C): Hemoglobin A1C: 6.9

## 2015-08-25 LAB — GLUCOSE, POCT (MANUAL RESULT ENTRY): POC GLUCOSE: 206 mg/dL — AB (ref 70–99)

## 2015-08-25 MED ORDER — KETOTIFEN FUMARATE 0.025 % OP SOLN: 1.0000 [drp] | Freq: Two times a day (BID) | OPHTHALMIC | 1 refills | Status: DC

## 2015-08-25 MED FILL — SM EYE ITCH RELIEF 0.025% D: 0.025 % | 30 days supply | Qty: 5 | Fill #0

## 2015-08-25 NOTE — Progress Notes (Signed)
C/C pain in the abdomen and right eye is swollen.

## 2015-08-25 NOTE — Assessment & Plan Note (Signed)
Hordeolum of eye, exam is not consistent with conjunctivitis or preseptal cellulitis  Plan: zatidor eye drops Warm compress Close f/u in one day for recheck

## 2015-08-25 NOTE — Progress Notes (Signed)
Subjective:  Patient ID: [name] [name], female    DOB: 01/25/1961  Age: 55 y.o. MRN: [id number]  CC: Abdominal Pain   HPI Cece Milhouse has diabetes,  HLD, obesity she presents for    1. Abdominal discomfort: since c-scope done in 01/2015. She is having a lot of gas and bloating. She feels this way everyday. Her c-scope was normal except for severe diverticulosis. She denies diarrhea. She blood in stool. Just a little constipation. She tries to hold in her gas at times and has rectal pain.   2. R eye redness: she woke up 2 days ago withredness, itching and swelling under R eye. No change in vision in  R eye. She has taken zyrtec without improvement. She applied cortisone ointment starting last night.    Social History  Substance Use Topics  . Smoking status: Never Smoker  . Smokeless tobacco: Never Used  . Alcohol use No   Outpatient Medications Prior to Visit  Medication Sig Dispense Refill  . aspirin 325 MG tablet Take 1 tablet (325 mg total) by mouth daily. 30 tablet 0  . cetirizine (ZYRTEC) 10 MG tablet Take 1 tablet (10 mg total) by mouth 2 (two) times daily. 60 tablet 11  . estradiol (ESTRACE VAGINAL) 0.1 MG/GM vaginal cream 1 applicatorful nightly for two weeks then twice weekly 42.5 g 12  . ketotifen (ZADITOR) 0.025 % ophthalmic solution Place 1 drop into the right eye 2 (two) times daily. 10 mL 1  . metFORMIN (GLUCOPHAGE) 500 MG tablet Take 1 tablet (500 mg total) by mouth 2 (two) times daily with a meal. 60 tablet 3  . omeprazole (PRILOSEC) 20 MG capsule Take 1 capsule (20 mg total) by mouth daily. 30 capsule 3  . pravastatin (PRAVACHOL) 40 MG tablet Take 1 tablet (40 mg total) by mouth daily. 90 tablet 3   No facility-administered medications prior to visit.     ROS Review of Systems  Constitutional: Negative for chills and fever.  Eyes: Positive for pain, discharge, redness and itching. Negative for photophobia and visual disturbance.    Respiratory: Negative for shortness of breath.   Cardiovascular: Negative for chest pain.  Gastrointestinal: Positive for abdominal pain and rectal pain. Negative for abdominal distention, anal bleeding, blood in stool, constipation, diarrhea, nausea and vomiting.  Musculoskeletal: Negative for arthralgias and back pain.  Skin: Negative for rash.  Allergic/Immunologic: Negative for immunocompromised state.  Hematological: Negative for adenopathy. Does not bruise/bleed easily.  Psychiatric/Behavioral: Negative for dysphoric mood and suicidal ideas.    Objective:  BP 127/68 (BP Location: Right Arm, Patient Position: Sitting, Cuff Size: Large)   Pulse 94   Temp 98.2 F (36.8 C) (Oral)   Ht  (1.575 m)   Wt 202 lb 9.6 oz (91.9 kg)   SpO2 95%   BMI 37.06 kg/m   BP/Weight 08/25/2015 06/19/2015 05/15/2015  Systolic BP 127 129 124  Diastolic BP 68 74 76  Wt. (Lbs) 202.6 199 200  BMI 37.06 36.39 36.57   Physical Exam  Constitutional: She is oriented to person, place, and time. She appears well-developed and well-nourished. No distress.  HENT:  Head: Normocephalic and atraumatic.  Eyes: Conjunctivae and EOM are normal. Pupils are equal, round, and reactive to light.    Cardiovascular: Normal rate, regular rhythm, normal heart sounds and intact distal pulses.   Pulmonary/Chest: Effort normal and breath sounds normal.  Abdominal: Soft. Bowel sounds are normal. She exhibits no distension and no mass. There is no tenderness. There  is no rebound and no guarding.  Musculoskeletal: She exhibits no edema.  Neurological: She is alert and oriented to person, place, and time.  Skin: Skin is warm and dry. No rash noted.  Psychiatric: She has a normal mood and affect.   CBG 206 Lab Results  Component Value Date   HGBA1C 6.7 05/15/2015   Lab Results  Component Value Date   HGBA1C 6.9 08/25/2015     Assessment & Plan:  [name] was seen today for abdominal pain and eye pain.  Diagnoses  and all orders for this visit:  Controlled type 2 diabetes mellitus without complication, without long-term current use of insulin (HCC) -     HgB A1c -     Glucose (CBG)  Excessive gas  Hordeolum externum of right lower eyelid -     ketotifen (ZADITOR) 0.025 % ophthalmic solution; Place 1 drop into the right eye 2 (two) times daily.    No orders of the defined types were placed in this encounter.   Follow-up: No Follow-up on file.   Dessa PhiJosalyn Zephan Beauchaine MD

## 2015-08-25 NOTE — Patient Instructions (Addendum)
[name] PieriniJuana was seen today for abdominal pain and eye pain.  Diagnoses and all orders for this visit:  Controlled type 2 diabetes mellitus without complication, without long-term current use of insulin (HCC) -     HgB A1c -     Glucose (CBG) -     Ambulatory referral to Ophthalmology  Excessive gas  Hordeolum externum of right lower eyelid -     ketotifen (ZADITOR) 0.025 % ophthalmic solution; Place 1 drop into the right eye 2 (two) times daily. -     Ambulatory referral to Ophthalmology   For gas  1. Exclude foods that increase flatulence (eg, beans, onions, celery, carrots, raisins, bananas, apricots, prunes, Brussels sprouts, wheat germ, pretzels, and bagels), alcohol, and caffeine.  You can also try beano or gas-x these are over the counter   F/u tomorrow afternoon for recheck of eye, double book ok if needed   Dr. Evalee JeffersonFunches   [name] [name] (Stye) Un [name] [name] es un bulto en el prpado causado por una infeccin bacteriana. Puede formarse dentro del prpado ([name] [name] interno) o fuera del prpado ([name] [name] externo). Un [name] [name] interno puede ser causado por una infeccin en una glndula sebcea dentro del prpado. Un [name] [name] externo puede estar causado por una infeccin en la base de la pestaa (folculo piloso). Los orzuelos son muy frecuentes. Todas las personas pueden tener orzuelos a Actuarycualquier edad. Suelen ocurrir solo en un ojo, Biomedical engineerpero puede tener ms de Inteluno en los dos ojos.  CAUSAS  La infeccin casi siempre es causada por una bacteria llamada [name] [name], que es un tipo comn de bacteria que vive en la piel. FACTORES DE RIESGO Puede tener un riesgo ms alto de sufrir un [name] [name] si ya ha tenido [place]. Tambin puede tener un riesgo ms alto si tiene:  Diabetes.  Una enfermedad crnica.  Enrojecimiento prolongado en los ojos.  Una afeccin cutnea denominada seborrea.  Niveles altos de grasa en la sangre (lpidos). SIGNOS Y SNTOMAS  El dolor en el prpado es el sntoma ms  frecuente del Fordorzuelo. Los orzuelos internos son ms dolorosos que los externos. Otros signos y sntomas pueden incluir los siguientes:  Hinchazn dolorosa del prpado.  Sensacin de [employer] Grouppicazn en el ojo.  Lagrimeo y enrojecimiento del ojo.  Pus que drena del [name] [name]. DIAGNSTICO  Con tan solo examinarle el ojo, el mdico puede diagnosticarle un [place]zuelo. Tambin puede revisarlo para asegurarse de que:  No tenga fiebre ni otros signos de una infeccin ms grave.  La infeccin no se haya diseminado a otras partes del ojo o a zonas circundantes. TRATAMIENTO  La mayora de los orzuelos desparecen en unos das sin Wrentratamiento. En algunos casos, puede necesitar antibiticos en gotas o ungento para prevenir la infeccin. Es posible que el mdico deba drenar el [name] [name] por va quirrgica si este:  Es grande.  Causa mucho dolor.  Interfiere con la visin. Esto se puede realizar con un instrumento cortante de hoja delgada o una aguja.  INSTRUCCIONES PARA EL CUIDADO EN EL HOGAR   Tome los medicamentos solamente como se lo haya indicado el mdico.  Aplique una compresa limpia y caliente sobre ojo durante 10minutos, 4veces al Futures traderda.  No use lentes de contacto ni maquillaje para los ojos [place] que el [name] [name] se haya curado.  No trate de reventar o drenar el [name] [name]. SOLICITE ATENCIN MDICA SI:  Tiene escalofros o fiebre.  El [name] [name] no desaparece despus de [address]varios das.  El [name] [name] afecta la visin.  Comienza a Psychiatristsentir dolor en el globo  ocular, o se le hincha o enrojece. ASEGRESE DE QUE:  Comprende estas instrucciones.  Controlar su afeccin.  Recibir ayuda de inmediato si no mejora o si empeora.   Esta informacin no tiene Theme park manager el consejo del mdico. Asegrese de hacerle al mdico cualquier pregunta que tenga.   Document Released: 10/13/2004 Document Revised: 01/24/2014 Elsevier Interactive Patient Education [employer]

## 2015-08-25 NOTE — Assessment & Plan Note (Signed)
For gas  1. Exclude foods that increase flatulence (eg, beans, onions, celery, carrots, raisins, bananas, apricots, prunes, Brussels sprouts, wheat germ, pretzels, and bagels), alcohol, and caffeine.

## 2015-08-26 ENCOUNTER — Ambulatory Visit: Payer: Self-pay | Attending: Family Medicine | Admitting: Family Medicine

## 2015-08-26 ENCOUNTER — Encounter: Payer: Self-pay | Admitting: Family Medicine

## 2015-08-26 VITALS — BP 104/67 | HR 87 | Temp 97.9°F | Ht 62.0 in | Wt 202.6 lb

## 2015-08-26 DIAGNOSIS — H00012 Hordeolum externum right lower eyelid: Secondary | ICD-10-CM | POA: Insufficient documentation

## 2015-08-26 NOTE — Patient Instructions (Signed)
[name] PieriniJuana was seen today for follow-up.  Diagnoses and all orders for this visit:  Hordeolum externum of right lower eyelid  continue warm compresses zatidor drops as needed for itching  F/u in 7-10 days to assess eye  Dr. Evalee JeffersonFunches   Orzuelo (Stye) Un orzuelo es un bulto en el prpado causado por una infeccin bacteriana. Puede formarse dentro del prpado (orzuelo interno) o fuera del prpado (orzuelo externo). Un orzuelo interno puede ser causado por una infeccin en una glndula sebcea dentro del prpado. Un orzuelo externo puede estar causado por una infeccin en la base de la pestaa (folculo piloso). Los orzuelos son muy frecuentes. Todas las personas pueden tener orzuelos a Actuarycualquier edad. Suelen ocurrir solo en un ojo, Biomedical engineerpero puede tener ms de Inteluno en los dos ojos.  CAUSAS  La infeccin casi siempre es causada por una bacteria llamada Staphylococcus aureus, que es un tipo comn de bacteria que vive en la piel. FACTORES DE RIESGO Puede tener un riesgo ms alto de sufrir un orzuelo si ya ha tenido Ellerbeuno. Tambin puede tener un riesgo ms alto si tiene:  Diabetes.  Una enfermedad crnica.  Enrojecimiento prolongado en los ojos.  Una afeccin cutnea denominada seborrea.  Niveles altos de grasa en la sangre (lpidos). SIGNOS Y SNTOMAS  El dolor en el prpado es el sntoma ms frecuente del Merriamorzuelo. Los orzuelos internos son ms dolorosos que los externos. Otros signos y sntomas pueden incluir los siguientes:  Hinchazn dolorosa del prpado.  Sensacin de [employer]bury Automotive Grouppicazn en el ojo.  Lagrimeo y enrojecimiento del ojo.  Pus que drena del orzuelo. DIAGNSTICO  Con tan solo examinarle el ojo, el mdico puede diagnosticarle un Five Pointsorzuelo. Tambin puede revisarlo para asegurarse de que:  No tenga fiebre ni otros signos de una infeccin ms grave.  La infeccin no se haya diseminado a otras partes del ojo o a zonas circundantes. TRATAMIENTO  La mayora de los orzuelos desparecen en unos  das sin [place]tratamiento. En algunos casos, puede necesitar antibiticos en gotas o ungento para prevenir la infeccin. Es posible que el mdico deba drenar el orzuelo por va quirrgica si este:  Es grande.  Causa mucho dolor.  Interfiere con la visin. Esto se puede realizar con un instrumento cortante de hoja delgada o una aguja.  INSTRUCCIONES PARA EL CUIDADO EN EL HOGAR   Tome los medicamentos solamente como se lo haya indicado el mdico.  Aplique una compresa limpia y caliente sobre ojo durante 10minutos, 4veces al Futures traderda.  No use lentes de contacto ni maquillaje para los ojos [place] que el orzuelo se haya curado.  No trate de reventar o drenar el orzuelo. SOLICITE ATENCIN MDICA SI:  Tiene escalofros o fiebre.  El orzuelo no desaparece despus de [address] Roadvarios das.  El orzuelo afecta la visin.  Comienza a Psychiatristsentir dolor en el globo ocular, o se le hincha o enrojece. ASEGRESE DE QUE:  Comprende estas instrucciones.  Controlar su afeccin.  Recibir ayuda de inmediato si no mejora o si empeora.   Esta informacin no tiene Theme park managercomo fin reemplazar el consejo del mdico. Asegrese de hacerle al mdico cualquier pregunta que tenga.   Document Released: 10/13/2004 Document Revised: 01/24/2014 Elsevier Interactive Patient Education [employer]2016 Elsevier Inc.

## 2015-08-26 NOTE — Progress Notes (Signed)
C/C: follow up to recheck eye

## 2015-08-26 NOTE — Assessment & Plan Note (Signed)
Improving with less redness, pain and swelling Improved visual acuity F/u in 7-10 days to monitor resolution

## 2015-08-26 NOTE — Progress Notes (Signed)
Subjective:  Patient ID: [name] [name], female    DOB: 01/25/1961  Age: 55 y.o. MRN: [id number]  CC: Follow-up   HPI [name] [name] presents for   1. F/u R eye hordeolum: doing better. Less redness, swelling and itching, just a little pain. Applying warm compresses  Social History  Substance Use Topics  . Smoking status: Never Smoker  . Smokeless tobacco: Never Used  . Alcohol use No    Outpatient Medications Prior to Visit  Medication Sig Dispense Refill  . aspirin 325 MG tablet Take 1 tablet (325 mg total) by mouth daily. 30 tablet 0  . cetirizine (ZYRTEC) 10 MG tablet Take 1 tablet (10 mg total) by mouth 2 (two) times daily. 60 tablet 11  . estradiol (ESTRACE VAGINAL) 0.1 MG/GM vaginal cream 1 applicatorful nightly for two weeks then twice weekly (Patient not taking: Reported on 08/25/2015) 42.5 g 12  . ketotifen (ZADITOR) 0.025 % ophthalmic solution Place 1 drop into the right eye 2 (two) times daily. 10 mL 1  . metFORMIN (GLUCOPHAGE) 500 MG tablet Take 1 tablet (500 mg total) by mouth 2 (two) times daily with a meal. 60 tablet 3  . omeprazole (PRILOSEC) 20 MG capsule Take 1 capsule (20 mg total) by mouth daily. 30 capsule 3  . pravastatin (PRAVACHOL) 40 MG tablet Take 1 tablet (40 mg total) by mouth daily. 90 tablet 3   No facility-administered medications prior to visit.     ROS Review of Systems  Constitutional: Negative for chills and fever.  Eyes: Positive for pain and redness. Negative for photophobia, discharge, itching and visual disturbance.  Respiratory: Negative for shortness of breath.   Cardiovascular: Negative for chest pain.  Musculoskeletal: Negative for arthralgias and back pain.  Skin: Negative for rash.  Allergic/Immunologic: Negative for immunocompromised state.  Hematological: Negative for adenopathy. Does not bruise/bleed easily.  Psychiatric/Behavioral: Negative for dysphoric mood and suicidal ideas.    Objective:  BP  104/67 (BP Location: Right Arm, Patient Position: Sitting, Cuff Size: Large)   Pulse 87   Temp 97.9 F (36.6 C) (Oral)   Ht 5\' 2"  (1.575 m)   Wt 202 lb 9.6 oz (91.9 kg)   SpO2 97%   BMI 37.06 kg/m   BP/Weight 08/26/2015 08/25/2015 06/19/2015  Systolic BP 104 127 129  Diastolic BP 67 68 74  Wt. (Lbs) 202.6 202.6 199  BMI 37.06 37.06 36.39   Physical Exam  Constitutional: She is oriented to person, place, and time. She appears well-developed and well-nourished. No distress.  HENT:  Head: Normocephalic and atraumatic.  Eyes: Conjunctivae and EOM are normal. Pupils are equal, round, and reactive to light.    Cardiovascular: Normal rate, regular rhythm, normal heart sounds and intact distal pulses.   Pulmonary/Chest: Effort normal and breath sounds normal.  Abdominal: Soft. Bowel sounds are normal. She exhibits no distension and no mass. There is no tenderness. There is no rebound and no guarding.  Musculoskeletal: She exhibits no edema.  Neurological: She is alert and oriented to person, place, and time.  Skin: Skin is warm and dry. No rash noted.  Psychiatric: She has a normal mood and affect.     Assessment & Plan:  [name] was seen today for follow-up.  Diagnoses and all orders for this visit:  Hordeolum externum of right lower eyelid   There are no diagnoses linked to this encounter.  No orders of the defined types were placed in this encounter.   Follow-up: No Follow-up on file.  Boykin Nearing MD

## 2015-09-10 ENCOUNTER — Ambulatory Visit: Payer: Self-pay | Attending: Family Medicine | Admitting: Family Medicine

## 2015-09-10 ENCOUNTER — Encounter: Payer: Self-pay | Admitting: Family Medicine

## 2015-09-10 VITALS — BP 119/68 | HR 85 | Temp 98.4°F | Ht 62.0 in | Wt 200.2 lb

## 2015-09-10 DIAGNOSIS — E119 Type 2 diabetes mellitus without complications: Secondary | ICD-10-CM

## 2015-09-10 DIAGNOSIS — Z7982 Long term (current) use of aspirin: Secondary | ICD-10-CM | POA: Insufficient documentation

## 2015-09-10 DIAGNOSIS — H00012 Hordeolum externum right lower eyelid: Secondary | ICD-10-CM

## 2015-09-10 DIAGNOSIS — Z79899 Other long term (current) drug therapy: Secondary | ICD-10-CM | POA: Insufficient documentation

## 2015-09-10 DIAGNOSIS — Z0000 Encounter for general adult medical examination without abnormal findings: Secondary | ICD-10-CM

## 2015-09-10 DIAGNOSIS — H04121 Dry eye syndrome of right lacrimal gland: Secondary | ICD-10-CM

## 2015-09-10 LAB — GLUCOSE, POCT (MANUAL RESULT ENTRY): POC Glucose: 132 mg/dl — AB (ref 70–99)

## 2015-09-10 MED ORDER — POLYETHYL GLYCOL-PROPYL GLYCOL 0.4-0.3 % OP SOLN: 1.0000 [drp] | Freq: Every day | OPHTHALMIC | 0 refills | Status: DC | PRN

## 2015-09-10 MED FILL — metFORMIN HCL 500 MG TABS: 500 | 30 days supply | Qty: 60 | Fill #2

## 2015-09-10 MED FILL — ?PRAVASTATIN NA 40 MG TAB: 40 MG | 30 days supply | Qty: 30 | Fill #3

## 2015-09-10 NOTE — Assessment & Plan Note (Signed)
Resolved Reassurance systane for dry eye  Routine diabetic eye exam

## 2015-09-10 NOTE — Patient Instructions (Addendum)
[name] PieriniJuana was seen today for follow-up.  Diagnoses and all orders for this visit:  Controlled type 2 diabetes mellitus without complication, without long-term current use of insulin (HCC) -     POCT glucose (manual entry)  Dry eye, right -     Polyethyl Glycol-Propyl Glycol (SYSTANE) 0.4-0.3 % SOLN; Apply 1 drop to eye daily as needed.    F/u in 6  months for diabetes   Dr. Armen PickupFunches   Influenza Virus Vaccine injection (Fluarix) Qu es este medicamento? La VACUNA ANTIGRIPAL ayuda a disminuir el riesgo de contraer la influenza, tambin conocida como la gripe. La vacuna solo ayuda a protegerle contra algunas cepas de influenza. Esta vacuna no ayuda a reducir Nurse, adultel riesgo de contraer influenza pandmica H1N1. Este medicamento puede ser utilizado para otros usos; si tiene alguna pregunta consulte con su proveedor de atencin mdica o con su farmacutico. Qu le debo informar a mi profesional de la salud antes de tomar este medicamento? Necesita saber si usted presenta alguno de los siguientes problemas o situaciones: -trastorno de sangrado como hemofilia -fiebre o infeccin -sndrome de Guillain-Barre u otros problemas neurolgicos -problemas del sistema inmunolgico -infeccin por el virus de la inmunodeficiencia humana (VIH) o SIDA -niveles bajos de plaquetas en la sangre -esclerosis mltiple -una Automotive engineerreaccin alrgica o inusual a las vacunas antigripales, a los huevos, protenas de pollo, al ltex, a la gentamicina, a otros medicamentos, alimentos, colorantes o conservantes -si est embarazada o buscando quedar embarazada -si est amamantando a un beb Cmo debo utilizar este medicamento? Esta vacuna se administra mediante inyeccin por va intramuscular. Lo administra un profesional de Beazer Homesla salud. Recibir una copia de informacin escrita sobre la vacuna antes de cada vacuna. Asegrese de leer este folleto cada vez cuidadosamente. Este folleto puede cambiar con frecuencia. Hable con su  pediatra para informarse acerca del uso de este medicamento en nios. Puede requerir atencin especial. Sobredosis: Pngase en contacto inmediatamente con un centro toxicolgico o una sala de urgencia si usted cree que haya tomado demasiado medicamento. ATENCIN: Reynolds AmericanEste medicamento es solo para usted. No comparta este medicamento con nadie. Qu sucede si me olvido de una dosis? No se aplica en este caso. Qu puede interactuar con este medicamento? -quimioterapia o radioterapia -medicamentos que suprimen el sistema inmunolgico, tales como etanercept, anakinra, infliximab y adalimumab -medicamentos que tratan o previenen cogulos sanguneos, como warfarina -fenitona -medicamentos esteroideos, como la prednisona o la cortisona -teofilina -vacunas Puede ser que esta lista no menciona todas las posibles interacciones. Informe a su profesional de Beazer Homesla salud de [employer] Inctodos los productos a base de hierbas, medicamentos de Marionventa libre o suplementos nutritivos que est tomando. Si usted fuma, consume bebidas alcohlicas o si utiliza drogas ilegales, indqueselo tambin a su profesional de Beazer Homesla salud. Algunas sustancias pueden interactuar con su medicamento. A qu debo estar atento al usar PPL Corporationeste medicamento? Informe a su mdico o a Producer, television/film/videosu profesional de la Dollar Generalsalud sobre todos los efectos secundarios que persistan despus de [address] das. Llame a su proveedor de atencin mdica si se presentan sntomas inusuales dentro de las 6 semanas posteriores a la vacunacin. Es posible que todava pueda contraer la gripe, pero la enfermedad no ser tan fuerte como normalmente. No puede contraer la gripe de esta vacuna. La vacuna antigripal no le protege contra resfros u otras enfermedades que pueden causar Front Royalfiebre. Debe vacunarse cada ao. Qu efectos secundarios puedo tener al Boston Scientificutilizar este medicamento? Efectos secundarios que debe informar a su mdico o a Producer, television/film/videosu profesional de la salud tan pronto como  sea posible: -reacciones alrgicas como  erupcin cutnea, picazn o urticarias, hinchazn de la cara, labios o lengua Efectos secundarios que, por lo general, no requieren atencin mdica (debe informarlos a su mdico o a su profesional de la salud si persisten o si son molestos): -fiebre -dolor de cabeza -molestias y dolores musculares -dolor, sensibilidad, enrojecimiento o Paramedichinchazn en el lugar de la inyeccin -cansancio o debilidad Puede ser que esta lista no menciona todos los posibles efectos secundarios. Comunquese a su mdico por asesoramiento mdico Hewlett-Packardsobre los efectos secundarios. Usted puede informar los efectos secundarios a la FDA por telfono al 1-800-FDA-1088. Dnde debo guardar mi medicina? Esta vacuna se administra solamente en clnicas, farmacias, consultorio mdico u otro consultorio de un profesional de la salud y no Teacher, early years/prenecesitar guardarlo en su domicilio. ATENCIN: Este folleto es un resumen. Puede ser que no cubra toda la posible informacin. Si usted tiene preguntas acerca de esta medicina, consulte con su mdico, su farmacutico o su profesional de Radiographer, therapeuticla salud.    2016, Elsevier/Gold Standard. (2009-07-07 15:31:40)

## 2015-09-10 NOTE — Progress Notes (Signed)
Subjective:  Patient ID: [name] [name], female    DOB: 01/25/1961  Age: 55 y.o. MRN: [id number]  CC: Follow-up (eye swelling)   HPI [name] [name] presents for   1. F/u R eye hordeolum: ey redness and swelling under R doing better. It started on 08/23/15. No redness, swelling or itching, just a little pain. Applying warm compresses.   Social History  Substance Use Topics  . Smoking status: Never Smoker  . Smokeless tobacco: Never Used  . Alcohol use No    Outpatient Medications Prior to Visit  Medication Sig Dispense Refill  . aspirin 325 MG tablet Take 1 tablet (325 mg total) by mouth daily. 30 tablet 0  . cetirizine (ZYRTEC) 10 MG tablet Take 1 tablet (10 mg total) by mouth 2 (two) times daily. 60 tablet 11  . estradiol (ESTRACE VAGINAL) 0.1 MG/GM vaginal cream 1 applicatorful nightly for two weeks then twice weekly (Patient not taking: Reported on 08/25/2015) 42.5 g 12  . ketotifen (ZADITOR) 0.025 % ophthalmic solution Place 1 drop into the right eye 2 (two) times daily. 10 mL 1  . metFORMIN (GLUCOPHAGE) 500 MG tablet Take 1 tablet (500 mg total) by mouth 2 (two) times daily with a meal. 60 tablet 3  . omeprazole (PRILOSEC) 20 MG capsule Take 1 capsule (20 mg total) by mouth daily. 30 capsule 3  . pravastatin (PRAVACHOL) 40 MG tablet Take 1 tablet (40 mg total) by mouth daily. 90 tablet 3   No facility-administered medications prior to visit.     ROS Review of Systems  Constitutional: Negative for chills and fever.  Eyes: Positive for pain, redness and visual disturbance (intermittent blurriness in R eye ). Negative for photophobia, discharge and itching.  Respiratory: Negative for shortness of breath.   Cardiovascular: Negative for chest pain.  Musculoskeletal: Negative for arthralgias and back pain.  Skin: Negative for rash.  Allergic/Immunologic: Negative for immunocompromised state.  Hematological: Negative for adenopathy. Does not bruise/bleed  easily.  Psychiatric/Behavioral: Negative for dysphoric mood and suicidal ideas.    Objective:  BP 119/68 (BP Location: Left Arm, Patient Position: Sitting, Cuff Size: Large)   Pulse 85   Temp 98.4 F (36.9 C) (Oral)   Ht 5\' 2"  (1.575 m)   Wt 200 lb 3.2 oz (90.8 kg)   SpO2 94%   BMI 36.62 kg/m   BP/Weight 09/10/2015 08/26/2015 08/25/2015  Systolic BP 119 104 127  Diastolic BP 68 67 68  Wt. (Lbs) 200.2 202.6 202.6  BMI 36.62 37.06 37.06   Physical Exam  Constitutional: She is oriented to person, place, and time. She appears well-developed and well-nourished. No distress.  HENT:  Head: Normocephalic and atraumatic.  Eyes: Conjunctivae and EOM are normal. Pupils are equal, round, and reactive to light.  Cardiovascular: Normal rate, regular rhythm, normal heart sounds and intact distal pulses.   Pulmonary/Chest: Effort normal and breath sounds normal.  Abdominal: Soft. Bowel sounds are normal. She exhibits no distension and no mass. There is no tenderness. There is no rebound and no guarding.  Musculoskeletal: She exhibits no edema.  Neurological: She is alert and oriented to person, place, and time.  Skin: Skin is warm and dry. No rash noted.  Psychiatric: She has a normal mood and affect.   Lab Results  Component Value Date   HGBA1C 6.9 08/25/2015     Assessment & Plan:  [name] was seen today for follow-up.  Diagnoses and all orders for this visit:  Healthcare maintenance -  Flu Vaccine QUAD 36+ mos IM  Controlled type 2 diabetes mellitus without complication, without long-term current use of insulin (HCC) -     POCT glucose (manual entry)  Dry eye, right -     Polyethyl Glycol-Propyl Glycol (SYSTANE) 0.4-0.3 % SOLN; Apply 1 drop to eye daily as needed.  Hordeolum externum of right lower eyelid   There are no diagnoses linked to this encounter.  No orders of the defined types were placed in this encounter.   Follow-up: Return in about 6 months (around  03/12/2016) for diabetes .   Dessa Phi MD

## 2015-10-13 MED FILL — metFORMIN HCL 500 MG TABS: 500 | 30 days supply | Qty: 60 | Fill #3

## 2015-11-16 ENCOUNTER — Telehealth: Payer: Self-pay | Admitting: Family Medicine

## 2015-11-16 DIAGNOSIS — R1013 Epigastric pain: Secondary | ICD-10-CM

## 2015-11-16 DIAGNOSIS — E119 Type 2 diabetes mellitus without complications: Secondary | ICD-10-CM

## 2015-11-16 DIAGNOSIS — Z9109 Other allergy status, other than to drugs and biological substances: Secondary | ICD-10-CM

## 2015-11-16 MED ORDER — CETIRIZINE HCL 10 MG PO TABS: 10.0000 mg | ORAL_TABLET | Freq: Two times a day (BID) | ORAL | 3 refills | Status: DC

## 2015-11-16 MED ORDER — OMEPRAZOLE 20 MG PO CPDR: 20.0000 mg | DELAYED_RELEASE_CAPSULE | Freq: Every day | ORAL | 3 refills | Status: DC

## 2015-11-16 MED ORDER — METFORMIN HCL 500 MG PO TABS: 500.0000 mg | ORAL_TABLET | Freq: Two times a day (BID) | ORAL | 3 refills | Status: DC

## 2015-11-16 MED FILL — ?METFORMIN HCL 500MG TABLET: 500 | 30 days supply | Qty: 60 | Fill #0

## 2015-11-16 MED FILL — ?CETIRIZINE HCL 10 MG TABLE: 10 | 30 days supply | Qty: 60 | Fill #0

## 2015-11-16 MED FILL — ?OMEPRAZOLE DR 20 MG CAPSUL: 20 | 30 days supply | Qty: 30 | Fill #0

## 2015-11-16 NOTE — Telephone Encounter (Signed)
Requested medications refilled 

## 2015-11-16 NOTE — Telephone Encounter (Signed)
Pt's husband came to request medication refill for omeprazole (PRILOSEC) 20 MG capsule, cetirizine (ZYRTEC) 10 MG tablet, metFORMIN (GLUCOPHAGE) 500 MG tablet . Please call rx to our pharmacy [facility]  Thank you.

## 2015-12-25 MED FILL — ?PRAVASTATIN NA 40 MG TAB: 40 MG | 30 days supply | Qty: 30 | Fill #4

## 2015-12-25 MED FILL — metFORMIN HCL 500 MG TABS: 500 | 30 days supply | Qty: 60 | Fill #1

## 2015-12-31 ENCOUNTER — Ambulatory Visit: Payer: Self-pay | Attending: Family Medicine | Admitting: Physician Assistant

## 2015-12-31 VITALS — BP 116/74 | HR 75 | Temp 97.7°F | Resp 16 | Wt 202.0 lb

## 2015-12-31 DIAGNOSIS — K5900 Constipation, unspecified: Secondary | ICD-10-CM | POA: Insufficient documentation

## 2015-12-31 DIAGNOSIS — E119 Type 2 diabetes mellitus without complications: Secondary | ICD-10-CM | POA: Insufficient documentation

## 2015-12-31 DIAGNOSIS — Z79899 Other long term (current) drug therapy: Secondary | ICD-10-CM | POA: Insufficient documentation

## 2015-12-31 DIAGNOSIS — R1084 Generalized abdominal pain: Secondary | ICD-10-CM | POA: Insufficient documentation

## 2015-12-31 DIAGNOSIS — Z7982 Long term (current) use of aspirin: Secondary | ICD-10-CM | POA: Insufficient documentation

## 2015-12-31 DIAGNOSIS — R300 Dysuria: Secondary | ICD-10-CM | POA: Insufficient documentation

## 2015-12-31 DIAGNOSIS — Z7984 Long term (current) use of oral hypoglycemic drugs: Secondary | ICD-10-CM | POA: Insufficient documentation

## 2015-12-31 LAB — POCT URINALYSIS DIPSTICK
Bilirubin, UA: NEGATIVE
GLUCOSE UA: NEGATIVE
Ketones, UA: NEGATIVE
LEUKOCYTES UA: NEGATIVE
NITRITE UA: NEGATIVE
Protein, UA: NEGATIVE
Spec Grav, UA: <=1.005
UROBILINOGEN UA: 0.2
pH, UA: 6

## 2015-12-31 LAB — GLUCOSE, POCT (MANUAL RESULT ENTRY): POC Glucose: 130 mg/dl — AB (ref 70–99)

## 2015-12-31 LAB — COMPREHENSIVE METABOLIC PANEL
ALBUMIN: 4.4 g/dL (ref 3.6–5.1)
ALT: 33 U/L — ABNORMAL HIGH (ref 6–29)
AST: 25 U/L (ref 10–35)
Alkaline Phosphatase: 83 U/L (ref 33–130)
BILIRUBIN TOTAL: 0.7 mg/dL (ref 0.2–1.2)
BUN: 10 mg/dL (ref 7–25)
CO2: 29 mmol/L (ref 20–31)
CREATININE: 0.58 mg/dL (ref 0.50–1.05)
Calcium: 9.8 mg/dL (ref 8.6–10.4)
Chloride: 102 mmol/L (ref 98–110)
GLUCOSE: 118 mg/dL — AB (ref 65–99)
Potassium: 3.7 mmol/L (ref 3.5–5.3)
SODIUM: 140 mmol/L (ref 135–146)
Total Protein: 7.1 g/dL (ref 6.1–8.1)

## 2015-12-31 NOTE — Progress Notes (Signed)
Complain of stomach pain 12 months

## 2015-12-31 NOTE — Progress Notes (Signed)
[name] [name], is a 55 y.o. female  UJW:[id number]:[name]  FAO:[id number]  DOB - 01/25/1961  Subjective:  Chief Complaint and HPI: [name] [name] is a 55 y.o. female here today for 1 month history of constant abdominal pain.  The pain is mild.  Ibuprofen helps.  Sometimes her appetite is decreased.  She occasionally has constipation.  She had a normal colonoscopy in January 2017. She denies diarrhea. No N/V/D.  No f/c.  [employer] interpreters used.  She has had a hysterectomy.   She denies s/x hyper/hypoglycemia.  ROS:   Constitutional:  No f/c, No night sweats, No unexplained weight loss. EENT:  No vision changes, No blurry vision, No hearing changes. No mouth, throat, or ear problems.  Respiratory: No cough, No SOB Cardiac: No CP, no palpitations GI:  + abd pain, No N/V/D. GU: Occasional dysuria Musculoskeletal: No joint pain Neuro: No headache, no dizziness, no motor weakness.  Skin: No rash Endocrine:  No polydipsia. No polyuria.  Psych: Denies SI/HI  No problems updated.  ALLERGIES: No Known Allergies  PAST MEDICAL HISTORY: Past Medical History:  Diagnosis Date  . Diabetes mellitus without complication (HCC)   . Medical history non-contributory     MEDICATIONS AT HOME: Prior to Admission medications   Medication Sig Start Date End Date Taking? Authorizing Provider  aspirin 325 MG tablet Take 1 tablet (325 mg total) by mouth daily. 11/07/12   Drema Dallasurtis J Woods, MD  cetirizine (ZYRTEC) 10 MG tablet Take 1 tablet (10 mg total) by mouth 2 (two) times daily. 11/16/15   Josalyn Funches, MD  estradiol (ESTRACE VAGINAL) 0.1 MG/GM vaginal cream 1 applicatorful nightly for two weeks then twice weekly 06/19/15   Dessa PhiJosalyn Funches, MD  ketotifen (ZADITOR) 0.025 % ophthalmic solution Place 1 drop into the right eye 2 (two) times daily. 08/25/15   Dessa PhiJosalyn Funches, MD  metFORMIN (GLUCOPHAGE) 500 MG tablet Take 1 tablet (500 mg total) by mouth 2 (two) times daily with a meal.  11/16/15   Josalyn Funches, MD  omeprazole (PRILOSEC) 20 MG capsule Take 1 capsule (20 mg total) by mouth daily. 11/16/15   Josalyn Funches, MD  Polyethyl Glycol-Propyl Glycol (SYSTANE) 0.4-0.3 % SOLN Apply 1 drop to eye daily as needed. 09/10/15   Josalyn Funches, MD  pravastatin (PRAVACHOL) 40 MG tablet Take 1 tablet (40 mg total) by mouth daily. 05/15/15   Josalyn Funches, MD     Objective:  EXAM:   Vitals:   12/31/15 1359  BP: 116/74  Pulse: 75  Resp: 16  Temp: 97.7 F (36.5 C)  TempSrc: Oral  SpO2: 97%  Weight: 202 lb (91.6 kg)    General appearance : A&OX3. NAD. Non-toxic-appearing HEENT: Atraumatic and Normocephalic.  PERRLA. EOM intact.  TM clear B. Mouth-MMM, post pharynx WNL w/o erythema, No PND. Neck: supple, no JVD. No cervical lymphadenopathy. No thyromegaly Chest/Lungs:  Breathing-non-labored, Good air entry bilaterally, breath sounds normal without rales, rhonchi, or wheezing  CVS: S1 S2 regular, no murmurs, gallops, rubs  Abdomen: Bowel sounds present, Non tender and not distended with no gaurding, rigidity or rebound.  There is minimal discomfort across the generalized lower abdomen. Extremities: Bilateral Lower Ext shows no edema, both legs are warm to touch with = pulse throughout Neurology:  CN II-XII grossly intact, Non focal.   Psych:  TP linear. J/I WNL. Normal speech. Appropriate eye contact and affect.  Skin:  No Rash  Data Review Lab Results  Component Value Date   HGBA1C 6.9 08/25/2015   HGBA1C  6.7 05/15/2015   HGBA1C 6.80 10/21/2014     Assessment & Plan   1. Generalized abdominal pain Unsure etiology. Definitely not an acute abdomen.  Colonoscopy January 2017 unremarkable.  Continue Ibuprofen prn pain for now.   - Comprehensive metabolic panel - H. pylori breath test  2. Controlled type 2 diabetes mellitus without complication, without long-term current use of insulin (HCC) Continue current regimen.  Work on low carb dietary choices,  increase water intake, cut out sugars.   - POCT glucose (manual entry)  3. Dysuria - POCT urinalysis dipstick UA is essentially normal/unchanged X 2 years ago     Patient have been counseled extensively about nutrition and exercise  Return in about 3 weeks (around 01/21/2016) for f/up Dr Armen PickupFunches; DM and abdominal pain.  The patient was given clear instructions to go to ER or return to medical center if symptoms don't improve, worsen or new problems develop. The patient verbalized understanding. The patient was told to call to get lab results if they haven't heard anything in the next week.     Georgian CoAngela Wesam Gearhart, PA-C [facility] [place], [place] [phone]   12/31/2015, 2:29 PMPatient ID: [name] [name], female   DOB: 01/25/1961, 55 y.o.   MRN: [id number]

## 2016-01-01 ENCOUNTER — Other Ambulatory Visit: Payer: Self-pay | Admitting: Physician Assistant

## 2016-01-01 DIAGNOSIS — A048 Other specified bacterial intestinal infections: Secondary | ICD-10-CM

## 2016-01-01 LAB — H. PYLORI BREATH TEST: H. PYLORI BREATH TEST: DETECTED — AB

## 2016-01-01 MED ORDER — CLARITHROMYCIN 500 MG PO TABS: 500.0000 mg | ORAL_TABLET | Freq: Two times a day (BID) | ORAL | 0 refills | Status: DC

## 2016-01-01 MED ORDER — OMEPRAZOLE 20 MG PO CPDR: 20.0000 mg | DELAYED_RELEASE_CAPSULE | Freq: Two times a day (BID) | ORAL | 0 refills | Status: DC

## 2016-01-01 MED ORDER — AMOXICILLIN 500 MG PO CAPS: 1000.0000 mg | ORAL_CAPSULE | Freq: Two times a day (BID) | ORAL | 0 refills | Status: DC

## 2016-01-04 ENCOUNTER — Other Ambulatory Visit: Payer: Self-pay | Admitting: Family Medicine

## 2016-01-08 ENCOUNTER — Telehealth: Payer: Self-pay

## 2016-01-08 NOTE — Telephone Encounter (Signed)
[employer] [name] Id: [id number] contacted patient to go over lab results pt didn't answer elvm asking pt to give me a call back at her earliest convenience

## 2016-01-26 MED FILL — OMEPRAZOLE DR 20 MG CAPSULE: 20 | 21 days supply | Qty: 42 | Fill #0

## 2016-01-26 MED FILL — ?CLARITHROMYCIN 500 MG TABS: 500 MG | 10 days supply | Qty: 20 | Fill #0

## 2016-01-26 MED FILL — AMOXICILLIN 500 MG CAPSULE: 500 | 10 days supply | Qty: 40 | Fill #0

## 2016-02-15 MED FILL — metFORMIN HCL 500 MG TABS: 500 | 30 days supply | Qty: 60 | Fill #2

## 2016-02-15 MED FILL — ?PRAVASTATIN NA 40 MG TAB: 40 MG | 30 days supply | Qty: 30 | Fill #5

## 2016-02-22 ENCOUNTER — Encounter: Payer: Self-pay | Admitting: Family Medicine

## 2016-02-22 ENCOUNTER — Ambulatory Visit: Payer: Self-pay | Attending: Family Medicine | Admitting: Family Medicine

## 2016-02-22 VITALS — BP 134/76 | HR 82 | Temp 98.2°F | Ht 62.0 in | Wt 202.8 lb

## 2016-02-22 DIAGNOSIS — E7800 Pure hypercholesterolemia, unspecified: Secondary | ICD-10-CM | POA: Insufficient documentation

## 2016-02-22 DIAGNOSIS — Z9889 Other specified postprocedural states: Secondary | ICD-10-CM | POA: Insufficient documentation

## 2016-02-22 DIAGNOSIS — Z7984 Long term (current) use of oral hypoglycemic drugs: Secondary | ICD-10-CM | POA: Insufficient documentation

## 2016-02-22 DIAGNOSIS — B9681 Helicobacter pylori [H. pylori] as the cause of diseases classified elsewhere: Secondary | ICD-10-CM | POA: Insufficient documentation

## 2016-02-22 DIAGNOSIS — E669 Obesity, unspecified: Secondary | ICD-10-CM | POA: Insufficient documentation

## 2016-02-22 DIAGNOSIS — G8929 Other chronic pain: Secondary | ICD-10-CM

## 2016-02-22 DIAGNOSIS — R109 Unspecified abdominal pain: Secondary | ICD-10-CM | POA: Insufficient documentation

## 2016-02-22 DIAGNOSIS — M545 Low back pain: Secondary | ICD-10-CM | POA: Insufficient documentation

## 2016-02-22 DIAGNOSIS — Z79899 Other long term (current) drug therapy: Secondary | ICD-10-CM | POA: Insufficient documentation

## 2016-02-22 DIAGNOSIS — R110 Nausea: Secondary | ICD-10-CM | POA: Insufficient documentation

## 2016-02-22 DIAGNOSIS — A048 Other specified bacterial intestinal infections: Secondary | ICD-10-CM

## 2016-02-22 DIAGNOSIS — Z7982 Long term (current) use of aspirin: Secondary | ICD-10-CM | POA: Insufficient documentation

## 2016-02-22 DIAGNOSIS — E119 Type 2 diabetes mellitus without complications: Secondary | ICD-10-CM | POA: Insufficient documentation

## 2016-02-22 LAB — GLUCOSE, POCT (MANUAL RESULT ENTRY): POC Glucose: 267 mg/dl — AB (ref 70–99)

## 2016-02-22 LAB — POCT GLYCOSYLATED HEMOGLOBIN (HGB A1C): Hemoglobin A1C: 7.1

## 2016-02-22 MED ORDER — METFORMIN HCL 500 MG PO TABS: 500.0000 mg | ORAL_TABLET | Freq: Two times a day (BID) | ORAL | 11 refills | Status: DC

## 2016-02-22 MED ORDER — PRAVASTATIN SODIUM 40 MG PO TABS: 40.0000 mg | ORAL_TABLET | Freq: Every day | ORAL | 11 refills | Status: DC

## 2016-02-22 NOTE — Assessment & Plan Note (Signed)
Slight decline with rise in A1c Compliant with metformin Advised exercise and weight loss

## 2016-02-22 NOTE — Assessment & Plan Note (Signed)
A: chronic recurrent MSK pain without red flags P: PM ibuprofen Exercise Weight loss Home PT

## 2016-02-22 NOTE — Progress Notes (Signed)
Subjective:  Patient ID: [name] [name], female    DOB: 01/25/1961  Age: 56 y.o. MRN: [id number] Spanish interpreter Moises ID # [id number]  CC: Abdominal Pain   HPI [name] [name] presents for   1. F/u H. Pylori: she was diagnosed with H. Pylori 2 months ago. She completed triple therapy. She denies abdominal pain. He has intermittent bloating and nausea after eating fatty foods.   2. CHRONIC DIABETES  Disease Monitoring  Blood Sugar Ranges: not checking   Polyuria: no   Visual problems: no   Medication Compliance: yes  Medication Side Effects  Hypoglycemia: no   3. Low back pain: started 2-3 months ago. Low back pain in early AM. Occurs most mornings. Does not radiate. No trauma. She is obese. Not exercising.   Social History  Substance Use Topics  . Smoking status: Never Smoker  . Smokeless tobacco: Never Used  . Alcohol use No   Past Surgical History:  Procedure Laterality Date  . NO PAST SURGERIES    . TUBAL LIGATION      Outpatient Medications Prior to Visit  Medication Sig Dispense Refill  . aspirin 325 MG tablet Take 1 tablet (325 mg total) by mouth daily. 30 tablet 0  . cetirizine (ZYRTEC) 10 MG tablet Take 1 tablet (10 mg total) by mouth 2 (two) times daily. 60 tablet 3  . clarithromycin (BIAXIN) 500 MG tablet Take 1 tablet (500 mg total) by mouth 2 (two) times daily. 20 tablet 0  . ketotifen (ZADITOR) 0.025 % ophthalmic solution Place 1 drop into the right eye 2 (two) times daily. 10 mL 1  . metFORMIN (GLUCOPHAGE) 500 MG tablet Take 1 tablet (500 mg total) by mouth 2 (two) times daily with a meal. 60 tablet 3  . omeprazole (PRILOSEC) 20 MG capsule Take 1 capsule (20 mg total) by mouth 2 (two) times daily before a meal. X 2weeks then 1 daily thereafter 42 capsule 0  . Polyethyl Glycol-Propyl Glycol (SYSTANE) 0.4-0.3 % SOLN Apply 1 drop to eye daily as needed. 10 mL 0  . pravastatin (PRAVACHOL) 40 MG tablet Take 1 tablet (40 mg total) by mouth  daily. 90 tablet 3  . amoxicillin (AMOXIL) 500 MG capsule Take 2 capsules (1,000 mg total) by mouth 2 (two) times daily. (Patient not taking: Reported on 02/22/2016) 40 capsule 0  . estradiol (ESTRACE VAGINAL) 0.1 MG/GM vaginal cream 1 applicatorful nightly for two weeks then twice weekly (Patient not taking: Reported on 02/22/2016) 42.5 g 12   No facility-administered medications prior to visit.     ROS Review of Systems  Constitutional: Negative for chills and fever.  Eyes: Negative for visual disturbance.  Respiratory: Negative for shortness of breath.   Cardiovascular: Negative for chest pain.  Gastrointestinal: Positive for abdominal pain. Negative for blood in stool.       Bloating   Musculoskeletal: Positive for back pain. Negative for arthralgias.  Skin: Negative for rash.  Allergic/Immunologic: Negative for immunocompromised state.  Hematological: Negative for adenopathy. Does not bruise/bleed easily.  Psychiatric/Behavioral: Negative for dysphoric mood and suicidal ideas.    Objective:  BP 134/76 (BP Location: Left Arm, Patient Position: Sitting, Cuff Size: Small)   Pulse 82   Temp 98.2 F (36.8 C) (Oral)   Ht 5\' 2"  (1.575 m)   Wt 202 lb 12.8 oz (92 kg)   SpO2 96%   BMI 37.09 kg/m   BP/Weight 02/22/2016 12/31/2015 09/10/2015  Systolic BP 134 116 119  Diastolic BP 76 74 68  Wt. (Lbs) 202.8 202 200.2  BMI 37.09 36.95 36.62    Physical Exam  Constitutional: She is oriented to person, place, and time. She appears well-developed and well-nourished. No distress.  HENT:  Head: Normocephalic and atraumatic.  Cardiovascular: Normal rate, regular rhythm, normal heart sounds and intact distal pulses.   Pulmonary/Chest: Effort normal and breath sounds normal.  Musculoskeletal: She exhibits no edema.       Back:  Neurological: She is alert and oriented to person, place, and time.  Skin: Skin is warm and dry. No rash noted.  Psychiatric: She has a normal mood and affect.     Lab Results  Component Value Date   HGBA1C 7.1 02/22/2016   CBG 267 Assessment & Plan:   [name] [name] was seen today for abdominal pain.  Diagnoses and all orders for this visit:  Controlled type 2 diabetes mellitus without complication, without long-term current use of insulin (HCC) -     POCT glucose (manual entry) -     POCT glycosylated hemoglobin (Hb A1C) -     Microalbumin / creatinine urine ratio -     metFORMIN (GLUCOPHAGE) 500 MG tablet; Take 1 tablet (500 mg total) by mouth 2 (two) times daily with a meal. -     pravastatin (PRAVACHOL) 40 MG tablet; Take 1 tablet (40 mg total) by mouth daily.  Positive H. pylori test  Chronic bilateral low back pain without sciatica  Pure hypercholesterolemia -     pravastatin (PRAVACHOL) 40 MG tablet; Take 1 tablet (40 mg total) by mouth daily.    No orders of the defined types were placed in this encounter.   Follow-up: Return in about 3 months (around 05/21/2016) for diabetes .   Dessa Phi MD

## 2016-02-22 NOTE — Patient Instructions (Addendum)
[name] [name] was seen today for abdominal pain.  Diagnoses and all orders for this visit:  Controlled type 2 diabetes mellitus without complication, without long-term current use of insulin (HCC) -     POCT glucose (manual entry) -     POCT glycosylated hemoglobin (Hb A1C) -     Microalbumin / creatinine urine ratio  Positive H. pylori test   400-600 mg ibuprofen at bedtime for low back pain  Increase exercise to 150 minutes per week.   F/u in 3 months for diabetes  Dr. Armen Pickup    Ejercicios para la espalda (Back Exercises) Si tiene dolor de espalda, haga estos ejercicios 2 o 3veces por da, o como se lo haya indicado el mdico. Cuando el dolor desaparezca, hgalos una vez por da, pero haga ms repeticiones de cada ejercicio. Si no le duele la espalda, haga estos ejercicios una vez por da o como se lo haya indicado el mdico. EJERCICIOS  Rodilla al pecho  Repita estos pasos 3 o 5veces seguidas con cada pierna: 1. Acustese boca arriba sobre una cama dura o sobre el suelo con las piernas extendidas. 2. Lleve una rodilla al pecho. 3. Mantenga la rodilla contra el pecho. Para lograrlo tmese la rodilla o el muslo. 4. Tire de la rodilla hasta sentir una elongacin suave en la parte baja de la espalda. 5. Mantenga la elongacin durante 10 a 30segundos. 6. Suelte y extienda la pierna lentamente. Inclinacin de la pelvis  Repita estos pasos 5 o 10veces seguidas: 1. Acustese boca arriba sobre una cama dura o sobre el suelo con las piernas extendidas. 2. Flexione las rodillas de manera que apunten al techo. Los pies deben estar apoyados en el suelo. 3. Contraiga los msculos de la parte baja del vientre (abdomen) para empujar la zona lumbar contra el suelo. Este movimiento har que el cccix apunte hacia el techo, en lugar de apuntar hacia abajo en direccin a los pies o al suelo. 4. Mantenga esta posicin durante 5 a 10segundos mientras contrae suavemente los msculos y respira con  normalidad. El perro y el gato  Repita estos pasos hasta que la zona lumbar se curve con ms facilidad: 1. Apoye las palmas de las manos y las rodillas sobre una superficie firme. Las manos deben estar alineadas con los hombros y las rodillas con las caderas. Puede colocarse almohadillas debajo de las rodillas. 2. Deje caer la cabeza y lleve el cccix hacia abajo de modo que apunte en direccin al suelo para que la zona lumbar se arquee como el lomo de un gato [place]. 3. Mantenga esta posicin durante 5segundos. 4. Lentamente, levante la cabeza y lleve el cccix hacia arriba de modo que apunte en direccin al techo para que la espalda se arquee (hunda) como el lomo de un perro contento. 5. Mantenga esta posicin durante 5segundos. Flexiones de brazos  Repita estos pasos 5 o 10veces seguidas: 1. Acustese boca abajo en el suelo. 2. Ponga las manos cerca de la cabeza, separadas aproximadamente al ancho de los hombros. 3. Con la espalda relajada y las caderas apoyadas en el suelo, extienda lentamente los brazos para levantar la mitad superior del cuerpo y Optometrist los hombros. No use los msculos de la espalda. Para estar ms cmodo, puede cambiar la [employer] Paper. 4. Mantenga esta posicin durante 5segundos. 5. Lentamente vuelva a la posicin horizontal. Puentes  Repita estos pasos 10veces seguidas: 1. Acustese boca arriba sobre una superficie firme. 2. Flexione las rodillas de manera que apunten al  techo. Los pies deben estar apoyados en el suelo. 3. Contraiga los glteos y despegue las nalgas del suelo hasta que la cintura est casi a la altura de las rodillas. Si no siente el trabajo muscular en las nalgas y la parte posterior de los muslos, aleje los pies 1 o 2pulgadas (2,5 o 5centmetros) de las nalgas. 4. Mantenga esta posicin durante 3 a 5segundos. 5. Lentamente, vuelva a apoyar las nalgas en el suelo y relaje los glteos. Si este ejercicio le resulta muy fcil, intente  realizarlo con los brazos cruzados [place]so[place] el pecho.  Abdominales  Repita estos pasos 5 o 10veces seguidas: 1. Acustese boca arriba sobre una cama dura o sobre el suelo con las piernas extendidas. 2. Flexione las rodillas de manera que apunten al techo. Los pies deben estar apoyados en el suelo. 3. Cruce los World Fuel Services Corporationbrazos sobre el pecho. 4. Baje levemente el mentn en direccin al pecho, pero no doble el cuello. 5. Contraiga los msculos del abdomen y con lentitud eleve el pecho lo suficiente como para despegar levemente los omplatos del suelo. 6. Lentamente baje el pecho y la cabeza hasta el suelo. Elevaciones de espalda  Repita estos pasos 5 o 10veces seguidas: 1. Acustese boca abajo con los brazos a los costados y apoye la frente en el suelo. 2. Contraiga los msculos de las piernas y los glteos. 3. Lentamente despegue el pecho del suelo mientras mantiene las caderas apoyadas en el suelo. Mantenga la nuca alineada con la curvatura de la espalda. Mire hacia el suelo mientras hace este ejercicio. 4. Mantenga esta posicin durante 3 a 5segundos. 5. Lentamente baje el pecho y el rostro hasta el suelo. SOLICITE AYUDA SI:  El dolor de espalda se vuelve mucho ms intenso cuando hace un ejercicio.  El dolor de espalda no se [place]alivia 2horas despus de ARAMARK Corporationhacer los ejercicios. Si tiene alguno de Limited Brandsestos problemas, deje de ARAMARK Corporationhacer los ejercicios. No vuelva a hacer los ejercicios a menos que el mdico lo autorice. SOLICITE AYUDA DE INMEDIATO SI:  Siente un dolor sbito y muy intenso en la espalda. Si esto ocurre, deje de Toys 'R' Ushacer los ejercicios. No vuelva a hacer los ejercicios a menos que el mdico lo autorice. Esta informacin no tiene Theme park managercomo fin reemplazar el consejo del mdico. Asegrese de hacerle al mdico cualquier pregunta que tenga. Document Released: 04/20/2010 Document Revised: 04/27/2015 Document Reviewed: 02/27/2014 Elsevier Interactive Patient Education  2017 ArvinMeritorElsevier Inc.

## 2016-02-23 LAB — MICROALBUMIN / CREATININE URINE RATIO: CREATININE, URINE: 36 mg/dL (ref 20–320)

## 2016-02-24 ENCOUNTER — Telehealth: Payer: Self-pay

## 2016-02-24 NOTE — Telephone Encounter (Signed)
CMA call to go over urine microalbumin results  CMA spoke with husband [name] [name] he was aware and understood about the results

## 2016-02-24 NOTE — Telephone Encounter (Signed)
-----   Message from Dessa PhiJosalyn Funches, MD sent at 02/23/2016  8:20 AM EST ----- Negative urine microalbumin Continue current plan for diabetes management with increased exercise

## 2016-03-09 ENCOUNTER — Other Ambulatory Visit: Payer: Self-pay | Admitting: Family Medicine

## 2016-03-09 DIAGNOSIS — Z1231 Encounter for screening mammogram for malignant neoplasm of breast: Secondary | ICD-10-CM

## 2016-03-22 MED FILL — metFORMIN HCL 500 MG TABS: 500 | 30 days supply | Qty: 60 | Fill #3

## 2016-03-24 ENCOUNTER — Ambulatory Visit
Admission: RE | Admit: 2016-03-24 | Discharge: 2016-03-24 | Disposition: A | Discharge status: Home / Self Care | Payer: No Typology Code available for payment source | Source: Ambulatory Visit | Attending: Family Medicine | Admitting: Family Medicine

## 2016-03-24 DIAGNOSIS — Z1231 Encounter for screening mammogram for malignant neoplasm of breast: Secondary | ICD-10-CM

## 2016-04-26 MED FILL — metFORMIN HCL 500 MG TABS: 500 | 30 days supply | Qty: 60 | Fill #0

## 2016-05-02 MED FILL — ?CETIRIZINE HCL 10 MG TABLE: 10 | 30 days supply | Qty: 60 | Fill #0

## 2016-05-20 ENCOUNTER — Ambulatory Visit: Payer: Self-pay | Attending: Family Medicine | Admitting: Family Medicine

## 2016-05-20 ENCOUNTER — Encounter: Payer: Self-pay | Admitting: Family Medicine

## 2016-05-20 VITALS — BP 122/77 | HR 80 | Temp 98.2°F | Ht 62.0 in | Wt 201.0 lb

## 2016-05-20 DIAGNOSIS — Z9889 Other specified postprocedural states: Secondary | ICD-10-CM | POA: Insufficient documentation

## 2016-05-20 DIAGNOSIS — Z8619 Personal history of other infectious and parasitic diseases: Secondary | ICD-10-CM | POA: Insufficient documentation

## 2016-05-20 DIAGNOSIS — R1013 Epigastric pain: Secondary | ICD-10-CM | POA: Insufficient documentation

## 2016-05-20 DIAGNOSIS — R110 Nausea: Secondary | ICD-10-CM | POA: Insufficient documentation

## 2016-05-20 DIAGNOSIS — E119 Type 2 diabetes mellitus without complications: Secondary | ICD-10-CM | POA: Insufficient documentation

## 2016-05-20 DIAGNOSIS — Z7984 Long term (current) use of oral hypoglycemic drugs: Secondary | ICD-10-CM | POA: Insufficient documentation

## 2016-05-20 DIAGNOSIS — E669 Obesity, unspecified: Secondary | ICD-10-CM | POA: Insufficient documentation

## 2016-05-20 DIAGNOSIS — L245 Irritant contact dermatitis due to other chemical products: Secondary | ICD-10-CM | POA: Insufficient documentation

## 2016-05-20 DIAGNOSIS — L249 Irritant contact dermatitis, unspecified cause: Secondary | ICD-10-CM | POA: Insufficient documentation

## 2016-05-20 DIAGNOSIS — Z79899 Other long term (current) drug therapy: Secondary | ICD-10-CM | POA: Insufficient documentation

## 2016-05-20 DIAGNOSIS — Z7982 Long term (current) use of aspirin: Secondary | ICD-10-CM | POA: Insufficient documentation

## 2016-05-20 LAB — POCT GLYCOSYLATED HEMOGLOBIN (HGB A1C): Hemoglobin A1C: 7.1

## 2016-05-20 LAB — GLUCOSE, POCT (MANUAL RESULT ENTRY): POC Glucose: 148 mg/dl — AB (ref 70–99)

## 2016-05-20 MED ORDER — PANTOPRAZOLE SODIUM 40 MG PO TBEC: 40.0000 mg | DELAYED_RELEASE_TABLET | Freq: Every day | ORAL | 3 refills | Status: DC

## 2016-05-20 MED ORDER — TRIAMCINOLONE ACETONIDE 0.5 % EX OINT: 1.0000 "application " | TOPICAL_OINTMENT | Freq: Two times a day (BID) | CUTANEOUS | 0 refills | Status: DC

## 2016-05-20 MED FILL — PANTOPRAZOLE SOD DR 40 MG T: 40 | 30 days supply | Qty: 30 | Fill #0

## 2016-05-20 MED FILL — TRIAMCINOLONE 0.5% OINTMENT: 0.5 | 15 days supply | Qty: 30 | Fill #0

## 2016-05-20 NOTE — Assessment & Plan Note (Signed)
Kenalog ointment for hands

## 2016-05-20 NOTE — Progress Notes (Signed)
Pt is having abdominal pains. Pt states she has constipation. Pt also has dry skin patches on both hands.

## 2016-05-20 NOTE — Progress Notes (Signed)
Subjective:  Patient ID: [name] [name], female    DOB: 01/25/1961  Age: 57 y.o. MRN: [id number] Spanish interpreter [name] [name] ID # [id number]  CC: Abdominal Pain   HPI [name] [name] has diabetes, obesity she presents for   1. Abdominal pain: x 2 months in epigastric area and RUQ. Occurs every other day or so. Occurs after eating and if she has not eaten for 1 hour. She has history of  H. Pylori diagnosed in 12/2015. She completed triple therapy.  She reports the pain she had in 12/2015 was lower abdomen. She has nausea. No emesis. No fever or chills.she is also experiencing constipation. She has a bowel movement. She denies diarrhea. She has c-scope done 01/2015 normal except for severe diverticulosis. She has an abdominal ultrasound in . She denies alcohol use.   2. CHRONIC DIABETES  Disease Monitoring  Blood Sugar Ranges: not checking   Polyuria: no   Visual problems: no   Medication Compliance: yes  Medication Side Effects  Hypoglycemia: no   3. Rash on hands: x 2 months. 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 The rash is pruritic.   Social History  Substance Use Topics  . Smoking status: Never Smoker  . Smokeless tobacco: Never Used  . Alcohol use No   Past Surgical History:  Procedure Laterality Date  . NO PAST SURGERIES    . TUBAL LIGATION      Outpatient Medications Prior to Visit  Medication Sig Dispense Refill  . aspirin 325 MG tablet Take 1 tablet (325 mg total) by mouth daily. 30 tablet 0  . cetirizine (ZYRTEC) 10 MG tablet Take 1 tablet (10 mg total) by mouth 2 (two) times daily. 60 tablet 3  . ketotifen (ZADITOR) 0.025 % ophthalmic solution Place 1 drop into the right eye 2 (two) times daily. 10 mL 1  . metFORMIN (GLUCOPHAGE) 500 MG tablet Take 1 tablet (500 mg total) by mouth 2 (two) times daily with a meal. 60 tablet 11  . omeprazole (PRILOSEC) 20 MG capsule Take 1 capsule (20 mg total) by mouth 2 (two) times daily before  a meal. X 2weeks then 1 daily thereafter 42 capsule 0  . pravastatin (PRAVACHOL) 40 MG tablet Take 1 tablet (40 mg total) by mouth daily. 30 tablet 11  . estradiol (ESTRACE VAGINAL) 0.1 MG/GM vaginal cream 1 applicatorful nightly for two weeks then twice weekly (Patient not taking: Reported on 02/22/2016) 42.5 g 12  . Polyethyl Glycol-Propyl Glycol (SYSTANE) 0.4-0.3 % SOLN Apply 1 drop to eye daily as needed. (Patient not taking: Reported on 05/20/2016) 10 mL 0   No facility-administered medications prior to visit.     ROS Review of Systems  Constitutional: Negative for chills and fever.  Eyes: Negative for visual disturbance.  Respiratory: Negative for shortness of breath.   Cardiovascular: Negative for chest pain.  Gastrointestinal: Positive for abdominal pain. Negative for blood in stool.       Bloating   Musculoskeletal: Positive for back pain. Negative for arthralgias.  Skin: Positive for rash.  Allergic/Immunologic: Negative for immunocompromised state.  Hematological: Negative for adenopathy. Does not bruise/bleed easily.  Psychiatric/Behavioral: Negative for dysphoric mood and suicidal ideas.     Objective:  BP 122/77   Pulse 80   Temp 98.2 F (36.8 C) (Oral)   Ht 5\' 2"  (1.575 m)   Wt 201 lb (91.2 kg)   SpO2 94%   BMI 36.76 kg/m   BP/Weight 05/20/2016 02/22/2016 12/31/2015  Systolic BP 122 134 116  Diastolic BP 77 76 74  Wt. (Lbs) 201 202.8 202  BMI 36.76 37.09 36.95    Physical Exam  Constitutional: She is oriented to person, place, and time. She appears well-developed and well-nourished. No distress.  HENT:  Head: Normocephalic and atraumatic.  Cardiovascular: Normal rate, regular rhythm, normal heart sounds and intact distal pulses.   Pulmonary/Chest: Effort normal and breath sounds normal.  Abdominal: Soft. Bowel sounds are normal. She exhibits no distension and no mass. There is tenderness in the right upper quadrant and epigastric area. There is no rigidity, no  rebound, no guarding, no CVA tenderness and negative Murphy's sign.  Obese   Musculoskeletal: She exhibits no edema.  Neurological: She is alert and oriented to person, place, and time.  Skin: Skin is warm and dry. No rash noted.     Psychiatric: She has a normal mood and affect.    Lab Results  Component Value Date   HGBA1C 7.1 02/22/2016   Lab Results  Component Value Date   HGBA1C 7.1 05/20/2016   CBG 148    Chemistry      Component Value Date/Time   NA 140 12/31/2015 1430   K 3.7 12/31/2015 1430   CL 102 12/31/2015 1430   CO2 29 12/31/2015 1430   BUN 10 12/31/2015 1430   CREATININE 0.58 12/31/2015 1430      Component Value Date/Time   CALCIUM 9.8 12/31/2015 1430   ALKPHOS 83 12/31/2015 1430   AST 25 12/31/2015 1430   ALT 33 (H) 12/31/2015 1430   BILITOT 0.7 12/31/2015 1430      Assessment & Plan:   [name] was seen today for abdominal pain.  Diagnoses and all orders for this visit:  Controlled type 2 diabetes mellitus without complication, without long-term current use of insulin (HCC) -     POCT glucose (manual entry) -     POCT glycosylated hemoglobin (Hb A1C)  Irritant contact dermatitis due to other chemical products -     triamcinolone ointment (KENALOG) 0.5 %; Apply 1 application topically 2 (two) times daily.  Epigastric pain -     pantoprazole (PROTONIX) 40 MG tablet; Take 1 tablet (40 mg total) by mouth daily. -     US Abdomen Complete; Future    No orders of the defined types were placed in this encounter.   Follow-up: Return in about 3 weeks (around 06/10/2016) for abdominal pain .   [name] PhiJosalyn Kaidan Spengler MD

## 2016-05-20 NOTE — Assessment & Plan Note (Signed)
Normal sugars  Continue current metformin 500 mg BID

## 2016-05-20 NOTE — Patient Instructions (Addendum)
[name] [name] was seen today for abdominal pain.  Diagnoses and all orders for this visit:  Controlled type 2 diabetes mellitus without complication, without long-term current use of insulin (HCC) -     POCT glucose (manual entry) -     POCT glycosylated hemoglobin (Hb A1C)  Irritant contact dermatitis due to other chemical products -     triamcinolone ointment (KENALOG) 0.5 %; Apply 1 application topically 2 (two) times daily.  Epigastric pain -     pantoprazole (PROTONIX) 40 MG tablet; Take 1 tablet (40 mg total) by mouth daily. -     US Abdomen Complete; Future   f/u in 3 weeks for abdominal pain   Dr. Armen Pickup   Opciones de alimentos para pacientes con reflujo gastroesofgico - Adultos (Food Choices for Gastroesophageal Reflux Disease, Adult) Cuando se tiene reflujo gastroesofgico (ERGE), los alimentos que se ingieren y los hbitos de alimentacin son Engineer, production. Elegir los alimentos adecuados puede ayudar a [employer]. QU PAUTAS DEBO SEGUIR?  Elija las frutas, los vegetales, los cereales integrales y los productos lcteos con bajo contenido de [place].  Elija las carnes de [place], de pescado y de ave con bajo contenido de grasas.  Limite las grasas, [address] [place], los aderezos para [place], la Casas, los frutos secos y Programme researcher, broadcasting/film/video.  Lleve un registro de alimentos. Esto ayuda a identificar los alimentos que ocasionan sntomas.  Evite los alimentos que le ocasionen sntomas. Pueden ser distintos para cada persona.  Haga comidas pequeas durante Glass blower/designer de 3 comidas abundantes.  Coma lentamente, en un lugar donde est distendido.  Limite el consumo de alimentos fritos.  Cocine los alimentos utilizando mtodos que no sean la fritura.  Evite el consumo alcohol.  Evite beber grandes cantidades de lquidos con las comidas.  Evite agacharse o recostarse hasta despus de 2 o 3horas de haber comido. QU ALIMENTOS NO SE RECOMIENDAN? Estos son algunos  alimentos y bebidas que pueden empeorar los sntomas: Veterinary surgeon. Jugo de tomate. Salsa de tomate y espagueti. Ajes. [place] y [place]. Rbano picante. Frutas Naranjas, pomelos y limn (fruta y [place]). Carnes Carnes de [place], de pescado y de ave con gran contenido de grasas. Esto incluye los perros calientes, las [place], el [place], la salchicha, el salame y el tocino. Lcteos Leche entera y [place]. PPG Industries. Crema. Mantequilla. Helados. Queso crema. Bebidas T o caf. Bebidas gaseosas o bebidas energizantes. Condimentos Salsa picante. Salsa barbacoa. Dulces/postres Chocolate y cacao. Ros[place]illas. Menta y mentol. Grasas y Du Pont. Esto incluye las papas fritas. Otros Vinagre. Especias picantes. Esto incluye la pimienta negra, la pimienta blanca, la pimienta roja, la pimienta de cayena, el curry en polvo, los clavos de [place], el jengibre y el [place] en polvo. Esta no es Raytheon de los alimentos y las bebidas que se Theatre stage manager. Comunquese con el nutricionista para recibir ms informacin. Esta informacin no tiene Theme park manager el consejo del mdico. Asegrese de hacerle al mdico cualquier pregunta que tenga. Document Released: 07/05/2011 Document Revised: 01/24/2014 Document Reviewed: 11/07/2012 Elsevier Interactive Patient Education  2017 Elsevier Inc.    Dieta con bajo contenido de grasas para la pancreatitis o los trastornos de la vescula (Low-Fat Diet for Pancreatitis or Gallbladder Conditions) Una dieta con bajo contenido de grasas puede ser til si usted tiene pancreatitis o trastornos de la vescula. En estos trastornos, el pncreas y la vescula tienen dificultades para digerir las grasas. Un plan de alimentacin saludable con menos  grasas ayudar a que el pncreas y la vescula descansen, y reducir los sntomas. QU DEBO SABER ACERCA DE ESTA DIETA?  Consuma una dieta con bajo contenido de [place]grasas.  Reduzca el consumo  de grasas a menos del 20% al 30% del total de caloras diarias. Esto representa menos de 50a 60gramos de grasas por da.  Recuerde que la dieta debe incluir algo de [place]grasa. Consulte al nutricionista cul debe ser su meta diaria.  Elija alimentos saludables sin grasas o con bajo contenido de grasas. Busque las palabras "sin grasa", "bajo en grasas" o "bajo contenido de grasas".  Energy East CorporationComo gua, mire la etiqueta y elija alimentos con menos de 3gramos de grasa por porcin. Coma solo una porcin.  Evite el alcohol.  No fumar. Si necesita ayuda para dejar de fumar, hable con el mdico.  Haga comidas pequeas y frecuentes en lugar de 3 comidas abundantes y pesadas. QU ALIMENTOS PUEDO COMER? Cereales Incluya granos y almidones saludables, como papas, pan integral, cereales ricos en fibras y Harlon Dittyarroz integral. Elija opciones de cereales integrales siempre que sea posible. En los adultos, los cereales integrales deben representar del 45% al 65% de las caloras diarias. Nils PyleFrutas y verduras Coma muchas frutas y verduras. Las frutas y verduras frescas aportan fibra a la dieta. Carnes y otras fuentes de protenas Coma carnes Mistonmagras, como pollo y cerdo. Quite la grasa de la carne antes de cocinarla. Los Highland Lakeshuevos, el pescado y los frijoles son otras fuentes de protenas. En los adultos, estos alimentos deben representar entre el 10% y el 35% de las caloras diarias. Lcteos Northeast UtilitiesElija leche y otros productos lcteos con bajo contenido de [place] Heightsgrasas. Los lcteos aportan grasas y protenas, adems de calcio. Grasas y Allstateaceites Limite los alimentos con alto contenido de grasas, como las comidas fritas, los Rushmeredulces, los productos horneados y las bebidas azucaradas. Otros Los condimentos y las salsas cremosas, como la Northwoodmayonesa, [place] Virginiapueden aportar grasa adicional. Piense si necesita usarlos o no, o use pequeas cantidades u opciones con bajo contenido de grasas. QU ALIMENTOS NO ESTN RECOMENDADOS?  Los alimentos con alto  contenido de [place]roodgrasas, por ejemplo:  Alimentos horneados.  Helados.  Tostadas francesas.  Panecillos dulces.  Pizza.  Pan de queso.  Alimentos rebozados o cubiertos con [place]mantequilla, salsas cremosas o queso.  Comidas fritas.  Postres y bebidas azucaradas.  Alimentos que causan gases o meteorismo Esta informacin no tiene Theme park managercomo fin reemplazar el consejo del mdico. Asegrese de hacerle al mdico cualquier pregunta que tenga. Document Released: 01/08/2013 Document Revised: 01/08/2013 Document Reviewed: 12/17/2012 Elsevier Interactive Patient Education  2017 ArvinMeritorElsevier Inc.

## 2016-05-20 NOTE — Assessment & Plan Note (Signed)
A: epigastric and RUQ pain in obese female with diabetes, suspect GERD and biliary stones. She was treated for H. Pylori in 12/2015. She takes Prilosec. She has known fatty liver   P: protonix Abdominal ultrasound GERD and biliary diet Weight loss

## 2016-05-26 ENCOUNTER — Ambulatory Visit (HOSPITAL_COMMUNITY)
Admission: RE | Admit: 2016-05-26 | Discharge: 2016-05-26 | Disposition: A | Discharge status: Home / Self Care | Payer: Self-pay | Source: Ambulatory Visit | Attending: Family Medicine | Admitting: Family Medicine

## 2016-05-26 DIAGNOSIS — R1013 Epigastric pain: Secondary | ICD-10-CM | POA: Insufficient documentation

## 2016-05-31 MED FILL — metFORMIN HCL 500 MG TABS: 500 | 30 days supply | Qty: 60 | Fill #1

## 2016-05-31 MED FILL — PRAVASTATIN NA 40 MG TAB: 40 | 30 days supply | Qty: 30 | Fill #0

## 2016-06-01 ENCOUNTER — Encounter: Payer: Self-pay | Admitting: Family Medicine

## 2016-06-16 ENCOUNTER — Ambulatory Visit: Payer: Self-pay | Attending: Family Medicine | Admitting: Family Medicine

## 2016-06-16 ENCOUNTER — Encounter: Payer: Self-pay | Admitting: Family Medicine

## 2016-06-16 VITALS — BP 119/76 | HR 75 | Temp 98.2°F | Wt 200.8 lb

## 2016-06-16 DIAGNOSIS — Z9851 Tubal ligation status: Secondary | ICD-10-CM | POA: Insufficient documentation

## 2016-06-16 DIAGNOSIS — Z79899 Other long term (current) drug therapy: Secondary | ICD-10-CM | POA: Insufficient documentation

## 2016-06-16 DIAGNOSIS — Z8619 Personal history of other infectious and parasitic diseases: Secondary | ICD-10-CM | POA: Insufficient documentation

## 2016-06-16 DIAGNOSIS — Z7984 Long term (current) use of oral hypoglycemic drugs: Secondary | ICD-10-CM | POA: Insufficient documentation

## 2016-06-16 DIAGNOSIS — R21 Rash and other nonspecific skin eruption: Secondary | ICD-10-CM | POA: Insufficient documentation

## 2016-06-16 DIAGNOSIS — E669 Obesity, unspecified: Secondary | ICD-10-CM | POA: Insufficient documentation

## 2016-06-16 DIAGNOSIS — L245 Irritant contact dermatitis due to other chemical products: Secondary | ICD-10-CM

## 2016-06-16 DIAGNOSIS — Z6836 Body mass index (BMI) 36.0-36.9, adult: Secondary | ICD-10-CM | POA: Insufficient documentation

## 2016-06-16 DIAGNOSIS — R5383 Other fatigue: Secondary | ICD-10-CM

## 2016-06-16 DIAGNOSIS — R1013 Epigastric pain: Secondary | ICD-10-CM | POA: Insufficient documentation

## 2016-06-16 DIAGNOSIS — K760 Fatty (change of) liver, not elsewhere classified: Secondary | ICD-10-CM

## 2016-06-16 DIAGNOSIS — E119 Type 2 diabetes mellitus without complications: Secondary | ICD-10-CM

## 2016-06-16 DIAGNOSIS — Z7982 Long term (current) use of aspirin: Secondary | ICD-10-CM | POA: Insufficient documentation

## 2016-06-16 LAB — GLUCOSE, POCT (MANUAL RESULT ENTRY): POC GLUCOSE: 190 mg/dL — AB (ref 70–99)

## 2016-06-16 MED ORDER — METFORMIN HCL 500 MG PO TABS: 500.0000 mg | ORAL_TABLET | Freq: Three times a day (TID) | ORAL | 11 refills | Status: DC

## 2016-06-16 NOTE — Assessment & Plan Note (Signed)
Plan: Hep A and B immunity check, vaccinate if non immune Weight loss Low carb and low fat diet Alcohol avoidance

## 2016-06-16 NOTE — Assessment & Plan Note (Signed)
Improving with kenalog ointment

## 2016-06-16 NOTE — Assessment & Plan Note (Signed)
Fatigue for past several months Plan: CBC TSH

## 2016-06-16 NOTE — Progress Notes (Signed)
Subjective:  Patient ID: [name] [name], female    DOB: 01/25/1961  Age: 56 y.o. MRN: [id number] Spanish interpreter Wells Guiles ID # [id number] CC: Abdominal Pain   HPI [name] [name] has diabetes, obesity she presents for   1. Abdominal pain: x 2 months in epigastric area and RUQ. Occurs every other day or so. Occurs after eating and if she has not eaten for 1 hour. She has history of  H. Pylori diagnosed in 12/2015. She completed triple therapy.  She reports the pain she had in 12/2015 was lower abdomen. She has nausea. No emesis. No fever or chills.she is also experiencing constipation. She has a bowel movement. She denies diarrhea. She has c-scope done 01/2015 normal except for severe diverticulosis. She has an abdominal ultrasound in 02/2016 for similar pain. Her ultrasound was normal except for diffuse fatty liver.. She denies alcohol use. She does not know if she has immunity to hep A and B. She admits to fatigue. For the past 6 month.  Daytime somnolence.  She sleeps well. She does sore. She reports pain has deceased as she started walking everyday and has decreased fat intake in her diet.   She had repeat ultrasound on 05/26/2016: PRESSION: Increased hepatic echotexture most compatible with fatty infiltrative change.  Normal appearance of the gallbladder. No stones are evident. If there are clinical concerns of chronic cholecystitis, a nuclear medicine hepatobiliary scan with gallbladder ejection fraction determination may be useful.  No acute abnormality observed elsewhere. Limited visualization of pancreas and IVC.  2. Fatigue: x 6 months. She admits to fatigue. For the past 6 months. Daytime somnolence. She sleeps well. She does snore.  3. Rash on hands: x 2 months. 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 The rash is pruritic. She is using kenalog ointment and the rash is improving.   Social History  Substance Use Topics  . Smoking status:  Never Smoker  . Smokeless tobacco: Never Used  . Alcohol use No   Past Surgical History:  Procedure Laterality Date  . TUBAL LIGATION  1995    Outpatient Medications Prior to Visit  Medication Sig Dispense Refill  . aspirin 325 MG tablet Take 1 tablet (325 mg total) by mouth daily. 30 tablet 0  . cetirizine (ZYRTEC) 10 MG tablet Take 1 tablet (10 mg total) by mouth 2 (two) times daily. 60 tablet 3  . estradiol (ESTRACE VAGINAL) 0.1 MG/GM vaginal cream 1 applicatorful nightly for two weeks then twice weekly (Patient not taking: Reported on 02/22/2016) 42.5 g 12  . ketotifen (ZADITOR) 0.025 % ophthalmic solution Place 1 drop into the right eye 2 (two) times daily. 10 mL 1  . metFORMIN (GLUCOPHAGE) 500 MG tablet Take 1 tablet (500 mg total) by mouth 2 (two) times daily with a meal. 60 tablet 11  . pantoprazole (PROTONIX) 40 MG tablet Take 1 tablet (40 mg total) by mouth daily. 30 tablet 3  . pravastatin (PRAVACHOL) 40 MG tablet Take 1 tablet (40 mg total) by mouth daily. 30 tablet 11  . triamcinolone ointment (KENALOG) 0.5 % Apply 1 application topically 2 (two) times daily. 30 g 0   No facility-administered medications prior to visit.     ROS Review of Systems  Constitutional: Positive for fatigue. Negative for chills and fever.  Eyes: Negative for visual disturbance.  Respiratory: Negative for shortness of breath.   Cardiovascular: Negative for chest pain.  Gastrointestinal: Positive for abdominal pain. Negative for blood in stool.  Bloating   Musculoskeletal: Positive for back pain. Negative for arthralgias.  Skin: Positive for rash.  Allergic/Immunologic: Negative for immunocompromised state.  Hematological: Negative for adenopathy. Does not bruise/bleed easily.  Psychiatric/Behavioral: Negative for dysphoric mood and suicidal ideas.     Objective:  BP 119/76   Pulse 75   Temp 98.2 F (36.8 C) (Oral)   Wt 200 lb 12.8 oz (91.1 kg)   SpO2 96%   BMI 36.73 kg/m    BP/Weight 06/16/2016 09/25/8336 02/21/537  Systolic BP 767 341 937  Diastolic BP 76 77 76  Wt. (Lbs) 200.8 201 202.8  BMI 36.73 36.76 37.09    Physical Exam  Constitutional: She is oriented to person, place, and time. She appears well-developed and well-nourished. No distress.  HENT:  Head: Normocephalic and atraumatic.  Cardiovascular: Normal rate, regular rhythm, normal heart sounds and intact distal pulses.   Pulmonary/Chest: Effort normal and breath sounds normal.  Abdominal: Soft. Bowel sounds are normal. She exhibits no distension and no mass. There is tenderness in the right upper quadrant and epigastric area. There is no rigidity, no rebound, no guarding, no CVA tenderness and negative Murphy's sign.  Obese   Musculoskeletal: She exhibits no edema.  Neurological: She is alert and oriented to person, place, and time.  Skin: Skin is warm and dry. No rash noted.     Psychiatric: She has a normal mood and affect.    Lab Results  Component Value Date   HGBA1C 7.1 05/20/2016   GBG 190    Chemistry      Component Value Date/Time   NA 140 12/31/2015 1430   K 3.7 12/31/2015 1430   CL 102 12/31/2015 1430   CO2 29 12/31/2015 1430   BUN 10 12/31/2015 1430   CREATININE 0.58 12/31/2015 1430      Component Value Date/Time   CALCIUM 9.8 12/31/2015 1430   ALKPHOS 83 12/31/2015 1430   AST 25 12/31/2015 1430   ALT 33 (H) 12/31/2015 1430   BILITOT 0.7 12/31/2015 1430      Assessment & Plan:   [name] [name] was seen today for abdominal pain.  Diagnoses and all orders for this visit:  Controlled type 2 diabetes mellitus without complication, without long-term current use of insulin (HCC) -     POCT glucose (manual entry) -     metFORMIN (GLUCOPHAGE) 500 MG tablet; Take 1 tablet (500 mg total) by mouth 3 (three) times daily with meals.  NAFL (nonalcoholic fatty liver) -     CMP14+EGFR -     Hepatitis A Antibody, Total -     Hepatitis B surface antibody -     CBC  Fatigue,  unspecified type -     CBC -     TSH    No orders of the defined types were placed in this encounter.   Follow-up: Return in about 8 weeks (around 08/11/2016) for fatigue.   Boykin Nearing MD

## 2016-06-16 NOTE — Patient Instructions (Addendum)
[name] [name] was seen today for abdominal pain.  Diagnoses and all orders for this visit:  Controlled type 2 diabetes mellitus without complication, without long-term current use of insulin (HCC) -     POCT glucose (manual entry) -     metFORMIN (GLUCOPHAGE) 500 MG tablet; Take 1 tablet (500 mg total) by mouth 3 (three) times daily with meals.  NAFL ([name] fatty liver) -     CMP14+EGFR -     Hepatitis A Antibody, Total -     Hepatitis B surface antibody -     CBC  Fatigue, unspecified type -     CBC -     TSH   F.u in 8 weeks for fatigue   Dr. Armen [name]   [name] [name] (Fatty Liver) El [name] [name], tambin llamado esteatosis heptica o esteatohepatitis, es una enfermedad en la que se acumula demasiada grasa en las clulas del [name]. El [name] elimina las sustancias dainas del torrente sanguneo y produce lquidos que el cuerpo necesita. Tambin ayuda al cuerpo a [name] [name] y [name] [name] la energa obtenida de los alimentos que come. En muchos casos, el [name] [name] no provoca sntomas ni problemas. Con frecuencia, se diagnostica cuando se realizan estudios por otros motivos. Sin embargo, con [name] [name] conservator, el [name] [name] puede provocar una inflamacin que puede causar problemas hepticos ms graves, como fibrosis heptica (cirrosis). CAUSAS Las causas del [name] [name] pueden incluir las siguientes:  Beber alcohol en exceso.  Dficit nutricional.  [name].  Sndrome de [place].  Diabetes.  Hiperlipidemia.  Embarazo.  Determinados medicamentos.  Txicos.  Algunas infecciones virales. FACTORES DE RIESGO Puede tener ms probabilidades de tener [name] [name] si:  Consume alcohol en exceso.  Est embarazada.  Tiene sobrepeso.  Tiene diabetes.  Tiene hepatitis.  Tiene un nivel alto de triglicridos. SIGNOS Y SNTOMAS El [name] [name] con frecuencia no provoca sntomas. En los casos en que se presentan sntomas, estos pueden  incluir:  Fatiga.  Debilidad.  Prdida de peso.  Confusin.  Dolor abdominal.  Color amarillo en la piel y en la zona blanca de los ojos (ictericia).  Nuseas y vmitos. DIAGNSTICO El [name] [name] se puede diagnosticar mediante lo siguiente:  Un examen fsico y [name] [name] historia clnica.  Anlisis de [name] [name].  Estudios de diagnstico por imgenes, como ecografa, tomografa computarizada (TC) o Health visitor (RM).  Biopsia de [name]. Se extrae una pequea muestra de tejido del [name] usando [name]. La muestra se examina en el microscopio. TRATAMIENTO El [name] [name] con frecuencia es causado por otras enfermedades. El tratamiento para el [name] [name] puede incluir medicamentos y cambios en el estilo de vida para controlar enfermedades como:  Alcoholismo.  Colesterol elevado.  Diabetes.  Exceso de [name] [name]. INSTRUCCIONES PARA EL CUIDADO EN EL HOGAR  Siga una dieta saludable como se lo haya indicado el mdico.  Haga [name] regularmente. Esto puede ayudarlo a [name] [name], y a Public house [name] y la diabetes. Hable con el mdico sobre qu actividades son mejores para usted y [name] [name].  No beba alcohol.  Tome los medicamentos solamente como se lo haya indicado el mdico. SOLICITE ATENCIN MDICA SI: Tiene dificultad para controlar lo siguiente:  Nivel de Banker.  Colesterol.  Consumo de alcohol. SOLICITE ATENCIN MDICA DE INMEDIATO SI:  Siente dolor abdominal.  Tiene ictericia.  Tiene nuseas o vmitos. Esta informacin no tiene Theme park [name] el consejo del mdico. Asegrese de hacerle al mdico cualquier pregunta que tenga. Document  Released: 10/24/2012 Document Revised: 01/24/2014 Document Reviewed: 05/15/2013 [name] [name]  2018 [name] [name] para la enfermedad del [name] [name] no relacionada con el alcohol ([name] [name]) La  enfermedad del [name] [name] no relacionada con el alcohol causa la acumulacin de grasa en el [name] y alrededor de este rgano. Esta afeccin impide al [name] funcionar correctamente. Seguir una dieta saludable puede ayudar a [name] [name] la enfermedad del [name] [name] no relacionada con el alcohol bajo control y, Actor, a Product/process [name] scientist o mejorar los cuadros clnicos asociados con la enfermedad, como las cardiopatas, la diabetes, la hipertensin arterial y las concentraciones anormales de colesterol. Junto con la actividad fsica habitual, esta dieta:  Promueve la prdida de [name] [name].  Ayuda a [name] [name].  Ayuda a mejorar la forma en que el organismo [place] la insulina. QU DEBO SABER ACERCA DE ESTA [place]?  Use el ndice glucmico (IG) para planificar las comidas. El ndice le informa con qu rapidez un alimento elevar su nivel de azcar en la sangre. Elija los alimentos con bajo IG. Estos tardan ms tiempo en subir el nivel de azcar en la sangre.  Lleve un registro de la cantidad de caloras que ingiere. Ingerir la cantidad correcta de caloras lo ayudar a [name] [name] peso saludable.  Tal vez deba seguir Web designer. Esta incluye una gran cantidad de verduras, carnes magras o pescado, cereales integrales, frutas, as como aceites y grasas saludables. QU ALIMENTOS PUEDO COMER? Cereales Cereales integrales, como los panes de salvado o integrales, las [place], las tortillas, los cereales y las pastas. Salvado molido. Pan integral de centeno. Avena sin azcar. Trigo burgol. Cebada. Quinua. Arroz integral o salvaje. Tortillas de harina de maz o de salvado. [name] [name]. [place]. Guisantes. Remolachas. Coliflor. Repollo. Brcoli. Zanahorias. Tomates. [place]. [name] [name]. Hierbas. Pimientos. Cebollas. Pepinos. Repollitos de Bruselas. [name] [name]. Frijoles. Lentejas. [name] [name]. Manzanas. Naranjas. Uvas. Papaya. Mango. [name]. Kiwi. Pomelo.  Cerezas. Carnes y otras fuentes de protenas Frutos de mar y Occupational hygienist. Carnes magras. Aves. Tofu. Lcteos Productos lcteos descremados o semidescremados, como yogur, queso cottage y [name] [name]. [name] [name]. Bebidas sin azcar. T. Caf. Leche descremada o semidescremada. Productos alternativos [place], como leche de soja o de [place]. Jugo de frutas naturales. Condimentos Mostaza. Salsa de pepinillos. [place] y salsa barbacoa con bajo contenido de grasa y de Location [name]. Mayonesa sin grasa o con bajo contenido de [place]. Dulces y postres Dulces sin azcar. Grasas y Medical laboratory scientific officer. Aceite de canola o de oliva. Nueces y [place] de frutos secos. Semillas. Los artculos mencionados arriba pueden no ser [employer] de las bebidas o los alimentos recomendados. Comunquese con el nutricionista para conocer ms opciones. QU ALIMENTOS NO SE RECOMIENDAN? Aceite de palma y de coco. Alimentos procesados. Comidas fritas. Bebidas azucaradas, como t [place], batidos con [place], [place], bebidas dulces heladas y [place]. Alcohol. Dulces. Alimentos con alto contenido de sal o de sodio. Los artculos mencionados arriba pueden no ser [employer] de las bebidas y los alimentos que se Higher [name] careers adviser. Comunquese con el nutricionista para obtener ms informacin. Esta informacin no tiene Marine scientist el consejo del mdico. Asegrese de hacerle al mdico cualquier pregunta que tenga. Document Released: 09/24/2014 Document Revised: 09/24/2014 Document Reviewed: 01/28/2014 [name] [name]  [employer].

## 2016-06-17 LAB — CMP14+EGFR
ALBUMIN: 4.5 g/dL (ref 3.5–5.5)
ALK PHOS: 99 IU/L (ref 39–117)
ALT: 36 IU/L — AB (ref 0–32)
AST: 26 IU/L (ref 0–40)
Albumin/Globulin Ratio: 1.5 (ref 1.2–2.2)
BILIRUBIN TOTAL: 0.7 mg/dL (ref 0.0–1.2)
BUN/Creatinine Ratio: 15 (ref 9–23)
BUN: 11 mg/dL (ref 6–24)
CHLORIDE: 102 mmol/L (ref 96–106)
CO2: 26 mmol/L (ref 18–29)
CREATININE: 0.74 mg/dL (ref 0.57–1.00)
Calcium: 9.8 mg/dL (ref 8.7–10.2)
GFR calc Af Amer: 106 mL/min/{1.73_m2} (ref >59)
GFR calc non Af Amer: 92 mL/min/{1.73_m2} (ref >59)
GLUCOSE: 135 mg/dL — AB (ref 65–99)
Globulin, Total: 3 g/dL (ref 1.5–4.5)
Potassium: 4.6 mmol/L (ref 3.5–5.2)
Sodium: 143 mmol/L (ref 134–144)
Total Protein: 7.5 g/dL (ref 6.0–8.5)

## 2016-06-17 LAB — CBC
HEMOGLOBIN: 13.7 g/dL (ref 11.1–15.9)
Hematocrit: 41.3 % (ref 34.0–46.6)
MCH: 28.8 pg (ref 26.6–33.0)
MCHC: 33.2 g/dL (ref 31.5–35.7)
MCV: 87 fL (ref 79–97)
PLATELETS: 287 10*3/uL (ref 150–379)
RBC: 4.76 x10E6/uL (ref 3.77–5.28)
RDW: 14.6 % (ref 12.3–15.4)
WBC: 5.4 10*3/uL (ref 3.4–10.8)

## 2016-06-17 LAB — HEPATITIS A ANTIBODY, TOTAL: HEP A TOTAL AB: POSITIVE — AB

## 2016-06-17 LAB — TSH: TSH: 1.73 u[IU]/mL (ref 0.450–4.500)

## 2016-06-17 LAB — HEPATITIS B SURFACE ANTIBODY,QUALITATIVE: Hep B Surface Ab, Qual: NONREACTIVE

## 2016-06-22 ENCOUNTER — Telehealth: Payer: Self-pay

## 2016-06-22 NOTE — Telephone Encounter (Signed)
Pt was called and informed of lab results.  [id number] 

## 2016-06-28 ENCOUNTER — Encounter: Payer: Self-pay | Admitting: Family Medicine

## 2016-06-28 ENCOUNTER — Ambulatory Visit: Payer: Self-pay | Attending: Family Medicine | Admitting: Family Medicine

## 2016-06-28 ENCOUNTER — Other Ambulatory Visit: Payer: Self-pay | Admitting: Family Medicine

## 2016-06-28 VITALS — BP 126/81 | HR 73 | Temp 98.3°F | Wt 199.6 lb

## 2016-06-28 DIAGNOSIS — Z9109 Other allergy status, other than to drugs and biological substances: Secondary | ICD-10-CM

## 2016-06-28 DIAGNOSIS — L245 Irritant contact dermatitis due to other chemical products: Secondary | ICD-10-CM

## 2016-06-28 DIAGNOSIS — Z23 Encounter for immunization: Secondary | ICD-10-CM

## 2016-06-28 DIAGNOSIS — E119 Type 2 diabetes mellitus without complications: Secondary | ICD-10-CM | POA: Insufficient documentation

## 2016-06-28 DIAGNOSIS — Z7984 Long term (current) use of oral hypoglycemic drugs: Secondary | ICD-10-CM | POA: Insufficient documentation

## 2016-06-28 DIAGNOSIS — K760 Fatty (change of) liver, not elsewhere classified: Secondary | ICD-10-CM | POA: Insufficient documentation

## 2016-06-28 DIAGNOSIS — Z9889 Other specified postprocedural states: Secondary | ICD-10-CM | POA: Insufficient documentation

## 2016-06-28 DIAGNOSIS — L249 Irritant contact dermatitis, unspecified cause: Secondary | ICD-10-CM | POA: Insufficient documentation

## 2016-06-28 DIAGNOSIS — E669 Obesity, unspecified: Secondary | ICD-10-CM | POA: Insufficient documentation

## 2016-06-28 DIAGNOSIS — Z79899 Other long term (current) drug therapy: Secondary | ICD-10-CM | POA: Insufficient documentation

## 2016-06-28 DIAGNOSIS — Z7982 Long term (current) use of aspirin: Secondary | ICD-10-CM | POA: Insufficient documentation

## 2016-06-28 LAB — GLUCOSE, POCT (MANUAL RESULT ENTRY): POC Glucose: 162 mg/dl — AB (ref 70–99)

## 2016-06-28 MED ORDER — FLUOCINONIDE 0.05 % EX OINT: 1.0000 "application " | TOPICAL_OINTMENT | Freq: Two times a day (BID) | CUTANEOUS | 0 refills | Status: DC

## 2016-06-28 MED ORDER — CETIRIZINE HCL 10 MG PO TABS: 10.0000 mg | ORAL_TABLET | Freq: Two times a day (BID) | ORAL | 5 refills | Status: AC

## 2016-06-28 MED FILL — FLUOCINONIDE 0.05% OINTMENT: 0.05 | 60 days supply | Qty: 60 | Fill #0

## 2016-06-28 MED FILL — ?CETIRIZINE HCL 10 MG TABLE: 10 | 30 days supply | Qty: 60 | Fill #0

## 2016-06-28 MED FILL — ?METFORMIN HCL 500MG TABLET: 500 | 30 days supply | Qty: 60 | Fill #2

## 2016-06-28 NOTE — Patient Instructions (Addendum)
[name] PieriniJuana was seen today for follow-up.  Diagnoses and all orders for this visit:  Controlled type 2 diabetes mellitus without complication, without long-term current use of insulin (HCC) -     POCT glucose (manual entry)  NAFL (nonalcoholic fatty liver)  Environmental allergies -     cetirizine (ZYRTEC) 10 MG tablet; Take 1 tablet (10 mg total) by mouth 2 (two) times daily.  Irritant contact dermatitis due to other chemical products -     fluocinonide ointment (LIDEX) 0.05 %; Apply 1 application topically 2 (two) times daily. To palms   Hep B vaccine series started today, 3 shots-  at 0, 1, 6 months  Return in 1 month for RN visit for second hep B vaccine  Cancel visit on  08/12/16  F/u in 3 months for diabetes follow up   Dr. Armen PickupFunches    Opciones de alimentos para pacientes con reflujo gastroesofgico - Adultos (Food Choices for Gastroesophageal Reflux Disease, Adult) Cuando se tiene reflujo gastroesofgico (ERGE), los alimentos que se ingieren y los hbitos de alimentacin son Engineer, productionmuy importantes. Elegir los alimentos adecuados puede ayudar a Altria Groupaliviar las molestias. QU PAUTAS DEBO SEGUIR?  Elija las frutas, los vegetales, los cereales integrales y los productos lcteos con bajo contenido de Bee Branchgrasa.  Elija las carnes de [place], de pescado y de ave con bajo contenido de grasas.  Limite las grasas, [address] Lanecomo los East Pointaceites, los aderezos para Tuntut[place]akensalada, la [place]wood Springsmanteca, los frutos secos y Programme researcher, broadcasting/film/videoel aguacate.  Lleve un registro de alimentos. Esto ayuda a identificar los alimentos que ocasionan sntomas.  Evite los alimentos que le ocasionen sntomas. Pueden ser distintos para cada persona.  Haga comidas pequeas durante Glass blower/designerel da en lugar de 3 comidas abundantes.  Coma lentamente, en un lugar donde est distendido.  Limite el consumo de alimentos fritos.  Cocine los alimentos utilizando mtodos que no sean la fritura.  Evite el consumo alcohol.  Evite beber grandes cantidades de lquidos con las  comidas.  Evite agacharse o recostarse hasta despus de 2 o 3horas de haber comido.  QU ALIMENTOS NO SE RECOMIENDAN? Estos son algunos alimentos y bebidas que pueden empeorar los sntomas: Veterinary surgeonVegetales Tomates. Jugo de tomate. Salsa de tomate y espagueti. Ajes. Cebolla y Center Lineajo. Rbano picante. Frutas Naranjas, pomelos y limn (fruta y Sloveniajugo). Carnes Carnes de [place], de pescado y de ave con gran contenido de grasas. Esto incluye los perros calientes, las Midlandcostillas, el [place]amn, la salchicha, el salame y el tocino. Lcteos Leche entera y Lake Camelotleche chocolatada. PPG IndustriesCrema cida. Crema. Mantequilla. Helados. Queso crema. Bebidas T o caf. Bebidas gaseosas o bebidas energizantes. Condimentos Salsa picante. Salsa barbacoa. Dulces/postres Chocolate y cacao. Rosquillas. Menta y mentol. Grasas y Du Pontaceites Alimentos muy grasos. Esto incluye las papas fritas. Otros Vinagre. Especias picantes. Esto incluye la pimienta negra, la pimienta blanca, la pimienta roja, la pimienta de cayena, el curry en polvo, los clavos de Av[place] Beacholor, el jengibre y el [place] en polvo. Esta no es Raytheonuna lista completa de los alimentos y las bebidas que se Theatre stage managerdeben evitar. Comunquese con el nutricionista para recibir ms informacin. Esta informacin no tiene Theme park managercomo fin reemplazar el consejo del mdico. Asegrese de hacerle al mdico cualquier pregunta que tenga. Document Released: 07/05/2011 Document Revised: 01/24/2014 Document Reviewed: 11/07/2012 Elsevier Interactive Patient Education  2017 [employer]vin[employer]itorElsevier [employer]

## 2016-06-28 NOTE — Assessment & Plan Note (Signed)
Persistent Change topical steroid to lidex

## 2016-06-28 NOTE — Assessment & Plan Note (Signed)
Patient is hep B non-immune Hep B vaccine series started today

## 2016-06-28 NOTE — Progress Notes (Addendum)
Subjective:  Patient ID: [name] [name], female    DOB: 01/25/1961  Age: 56 y.o. MRN: [id number] [employer] video Spanish interpreter CC: Follow-up   HPI Gabryella Murfin has diabetes, obesity she presents for   1. Non-alcoholic fatty liver:  Associated abdominal pain has resolved with daily exercise.  She had c-scope done 01/2015 normal except for severe diverticulosis. She has an abdominal ultrasound in 02/2016 for similar pain. Her ultrasound was normal except for diffuse fatty liver.. She denies alcohol use. She is hep A immune but needs hep B vaccination.   She had repeat ultrasound on 05/26/2016: PRESSION: Increased hepatic echotexture most compatible with fatty infiltrative change.  Normal appearance of the gallbladder. No stones are evident. If there are clinical concerns of chronic cholecystitis, a nuclear medicine hepatobiliary scan with gallbladder ejection fraction determination may be useful.  No acute abnormality observed elsewhere. Limited visualization of pancreas and IVC.   3. Rash on hands: x 3 months. 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 The rash is pruritic. She is using kenalog ointment and the rash has improved slightly but persist.   Social History  Substance Use Topics  . Smoking status: Never Smoker  . Smokeless tobacco: Never Used  . Alcohol use No   Past Surgical History:  Procedure Laterality Date  . TUBAL LIGATION  1995    Outpatient Medications Prior to Visit  Medication Sig Dispense Refill  . aspirin 325 MG tablet Take 1 tablet (325 mg total) by mouth daily. 30 tablet 0  . cetirizine (ZYRTEC) 10 MG tablet Take 1 tablet (10 mg total) by mouth 2 (two) times daily. 60 tablet 3  . cetirizine (ZYRTEC) 10 MG tablet TAKE 1 TABLET BY MOUTH 2 TIMES DAILY 60 tablet 2  . ketotifen (ZADITOR) 0.025 % ophthalmic solution Place 1 drop into the right eye 2 (two) times daily. 10 mL 1  . metFORMIN (GLUCOPHAGE) 500 MG  tablet Take 1 tablet (500 mg total) by mouth 3 (three) times daily with meals. 90 tablet 11  . pantoprazole (PROTONIX) 40 MG tablet Take 1 tablet (40 mg total) by mouth daily. 30 tablet 3  . pravastatin (PRAVACHOL) 40 MG tablet Take 1 tablet (40 mg total) by mouth daily. 30 tablet 11  . triamcinolone ointment (KENALOG) 0.5 % Apply 1 application topically 2 (two) times daily. 30 g 0   No facility-administered medications prior to visit.     ROS Review of Systems  Constitutional: Positive for fatigue. Negative for chills and fever.  Eyes: Negative for visual disturbance.  Respiratory: Negative for shortness of breath.   Cardiovascular: Negative for chest pain.  Gastrointestinal: Negative for abdominal pain and blood in stool.  Musculoskeletal: Negative for arthralgias and back pain.  Skin: Positive for rash.  Allergic/Immunologic: Negative for immunocompromised state.  Hematological: Negative for adenopathy. Does not bruise/bleed easily.  Psychiatric/Behavioral: Negative for dysphoric mood and suicidal ideas.     Objective:  BP 126/81   Pulse 73   Temp 98.3 F (36.8 C) (Oral)   Wt 199 lb 9.6 oz (90.5 kg)   SpO2 96%   BMI 36.51 kg/m   BP/Weight 06/28/2016 06/16/2016 05/20/2016  Systolic BP 126 119 122  Diastolic BP 81 76 77  Wt. (Lbs) 199.6 200.8 201  BMI 36.51 36.73 36.76    Physical Exam  Constitutional: She is oriented to person, place, and time. She appears well-developed and well-nourished. No distress.  HENT:  Head: Normocephalic and atraumatic.  Cardiovascular: Normal  rate, regular rhythm, normal heart sounds and intact distal pulses.   Pulmonary/Chest: Effort normal and breath sounds normal.  Abdominal: Soft. She exhibits no mass. There is no tenderness. There is no rigidity, no rebound, no guarding, no CVA tenderness and negative Murphy's sign.  Obese   Musculoskeletal: She exhibits no edema.  Neurological: She is alert and oriented to person, place, and time.    Skin: Skin is warm and dry. No rash noted.     Psychiatric: She has a normal mood and affect.    Lab Results  Component Value Date   HGBA1C 7.1 05/20/2016   GBG 190    Chemistry      Component Value Date/Time   NA 143 06/16/2016 1053   K 4.6 06/16/2016 1053   CL 102 06/16/2016 1053   CO2 26 06/16/2016 1053   BUN 11 06/16/2016 1053   CREATININE 0.74 06/16/2016 1053   CREATININE 0.58 12/31/2015 1430      Component Value Date/Time   CALCIUM 9.8 06/16/2016 1053   ALKPHOS 99 06/16/2016 1053   AST 26 06/16/2016 1053   ALT 36 (H) 06/16/2016 1053   BILITOT 0.7 06/16/2016 1053      Assessment & Plan:   [name] was seen today for follow-up.  Diagnoses and all orders for this visit:  Controlled type 2 diabetes mellitus without complication, without long-term current use of insulin (HCC) -     POCT glucose (manual entry)  NAFL (nonalcoholic fatty liver) -     Hepatitis B vaccine adult IM  Environmental allergies -     cetirizine (ZYRTEC) 10 MG tablet; Take 1 tablet (10 mg total) by mouth 2 (two) times daily.  Irritant contact dermatitis due to other chemical products -     fluocinonide ointment (LIDEX) 0.05 %; Apply 1 application topically 2 (two) times daily. To palms    No orders of the defined types were placed in this encounter.   Follow-up: Return in about 1 month (around 07/28/2016) for sencond hep B vaccine.   Dessa PhiJosalyn Doyal Saric MD

## 2016-07-28 ENCOUNTER — Ambulatory Visit: Payer: Self-pay | Attending: Family Medicine | Admitting: *Deleted

## 2016-07-28 DIAGNOSIS — Z0000 Encounter for general adult medical examination without abnormal findings: Secondary | ICD-10-CM

## 2016-07-28 DIAGNOSIS — Z23 Encounter for immunization: Secondary | ICD-10-CM | POA: Insufficient documentation

## 2016-07-28 MED FILL — PRAVASTATIN NA 40 MG TAB: 40 | 30 days supply | Qty: 30 | Fill #1

## 2016-07-28 MED FILL — ?METFORMIN HCL 500MG TABLET: 500 | 30 days supply | Qty: 60 | Fill #3

## 2016-07-28 MED FILL — ?PANTOPRAZOLE SOD DR 40MG: 40 MG | 30 days supply | Qty: 30 | Fill #1

## 2016-07-28 NOTE — Progress Notes (Signed)
Patient here today for Hepatitis C injection. Injection administered today in Right deltoid. Site unremarkable & patient tolerated injection well.  Next injection due between December, 2019.  Interpretation assistance provided by  # [phone]

## 2016-08-12 ENCOUNTER — Ambulatory Visit: Payer: Self-pay | Admitting: Family Medicine

## 2016-09-01 MED FILL — ?METFORMIN HCL 500MG TABLET: 500 | 30 days supply | Qty: 60 | Fill #4

## 2016-09-29 ENCOUNTER — Encounter: Payer: Self-pay | Admitting: Internal Medicine

## 2016-09-29 ENCOUNTER — Ambulatory Visit: Payer: Self-pay | Attending: Internal Medicine | Admitting: Internal Medicine

## 2016-09-29 VITALS — BP 118/81 | HR 79 | Temp 98.2°F | Resp 18 | Ht 62.0 in | Wt 198.0 lb

## 2016-09-29 DIAGNOSIS — Z23 Encounter for immunization: Secondary | ICD-10-CM

## 2016-09-29 DIAGNOSIS — M545 Low back pain, unspecified: Secondary | ICD-10-CM

## 2016-09-29 DIAGNOSIS — E119 Type 2 diabetes mellitus without complications: Secondary | ICD-10-CM

## 2016-09-29 DIAGNOSIS — M791 Myalgia: Secondary | ICD-10-CM | POA: Insufficient documentation

## 2016-09-29 DIAGNOSIS — E669 Obesity, unspecified: Secondary | ICD-10-CM

## 2016-09-29 DIAGNOSIS — K219 Gastro-esophageal reflux disease without esophagitis: Secondary | ICD-10-CM

## 2016-09-29 DIAGNOSIS — E785 Hyperlipidemia, unspecified: Secondary | ICD-10-CM | POA: Insufficient documentation

## 2016-09-29 DIAGNOSIS — G44201 Tension-type headache, unspecified, intractable: Secondary | ICD-10-CM

## 2016-09-29 DIAGNOSIS — Z6830 Body mass index (BMI) 30.0-30.9, adult: Secondary | ICD-10-CM | POA: Insufficient documentation

## 2016-09-29 DIAGNOSIS — Z79899 Other long term (current) drug therapy: Secondary | ICD-10-CM | POA: Insufficient documentation

## 2016-09-29 DIAGNOSIS — Z7982 Long term (current) use of aspirin: Secondary | ICD-10-CM | POA: Insufficient documentation

## 2016-09-29 DIAGNOSIS — G510 Bell's palsy: Secondary | ICD-10-CM | POA: Insufficient documentation

## 2016-09-29 DIAGNOSIS — R51 Headache: Secondary | ICD-10-CM | POA: Insufficient documentation

## 2016-09-29 DIAGNOSIS — K760 Fatty (change of) liver, not elsewhere classified: Secondary | ICD-10-CM

## 2016-09-29 LAB — POCT GLYCOSYLATED HEMOGLOBIN (HGB A1C): Hemoglobin A1C: 6.8

## 2016-09-29 LAB — GLUCOSE, POCT (MANUAL RESULT ENTRY): POC Glucose: 129 mg/dl — AB (ref 70–99)

## 2016-09-29 MED ORDER — TRUEPLUS LANCETS 28G MISC: 1 refills | Status: DC

## 2016-09-29 MED ORDER — GLUCOSE BLOOD VI STRP: ORAL_STRIP | 12 refills | Status: DC

## 2016-09-29 MED ORDER — TRUE METRIX METER W/DEVICE KIT: PACK | 0 refills | Status: DC

## 2016-09-29 MED ORDER — PANTOPRAZOLE SODIUM 20 MG PO TBEC: DELAYED_RELEASE_TABLET | ORAL | 2 refills | Status: DC

## 2016-09-29 MED ORDER — METFORMIN HCL 500 MG PO TABS: 500.0000 mg | ORAL_TABLET | Freq: Two times a day (BID) | ORAL | 11 refills | Status: DC

## 2016-09-29 MED FILL — TRUE METRIX TEST STRIP: 30 days supply | Qty: 100 | Fill #0

## 2016-09-29 MED FILL — PANTOPRAZOLE SOD DR 20 MG T: 20 | 30 days supply | Qty: 15 | Fill #0

## 2016-09-29 MED FILL — !TRUE METRIX BLOOD GLUCOSE: 1 days supply | Qty: 1 | Fill #0

## 2016-09-29 MED FILL — TRUEplus LANCETS 28G MISC: 30 days supply | Qty: 100 | Fill #0

## 2016-09-29 MED FILL — metFORMIN HCL 500 MG TABS: 500 | 30 days supply | Qty: 60 | Fill #0

## 2016-09-29 NOTE — Progress Notes (Signed)
Patient ID: [name] [name], female    DOB: 01/25/1961  MRN: [id number]  CC: re-establish and Diabetes   Subjective: [name] [name] is a 56 y.o. female who presents for chronic ds management. Last saw Dr. Adrian Blackwater 06/2016. Her concerns today include:  Pt with hx of DM type 2, obesity, NAFLD, HL, GERD  1. C/o pain in lower back for 1 wk that has resolved on its own. -works in [employer] and does a lot of repetitive bending to get in and out of fridge. Thinks this strained her back.  2. DM: -no device to check BS. Would like to get one -"I'm watching what I eat as advise by the doctor but I feel hungry." has a V8 Veg juice for breakfast -eats lunch around 9-10 a.m but has to eat on the run due to work schedule.  "I eat until I feel I'm full." Not sure why she is not losing wgh. Eats 1-2 tortias a day. Not too much bread. No sodas.  -walks every night for exercise with spouse.  -compliant with Metformin but taking BID not TID as indicated on med list and Pravastatin  3. Feels "sleepy and has minor headache" this a.m. Frontal in location. Not a chronic thing. -no dizziness or blurred vision.  -sleeping well. Got in 8-9 hrs last evening. However on work nights only 5 hrs - goes to bed 11 p.m and gets up at 4 a.m to be at work  For 5:30 a.m. Works 5 days a wk. -endorses snoring sometimes but not a major issue. Sleeps with spouse. Denies daytime sleepiness  4. GERD: Protonix makes her feel dry, tongue and skin. Taking QOD. Avoiding foods that cause heartburn  Patient Active Problem List   Diagnosis Date Noted  . NAFL (nonalcoholic fatty liver) 45/03/8880  . Fatigue 06/16/2016  . Irritant contact dermatitis due to other chemical products 05/20/2016  . Excessive gas 08/25/2015  . Hordeolum externum of right lower eyelid 08/25/2015  . Myalgia 05/15/2015  . Low back pain 12/05/2014  . Diabetes type 2, controlled ([place]) 10/21/2014  . Hyperlipidemia 11/04/2013  .  Environmental allergies 07/12/2013  . Bell's palsy 11/06/2012  . Neck pain 11/06/2012  . GERD 11/04/2005     Current Outpatient Prescriptions on File Prior to Visit  Medication Sig Dispense Refill  . aspirin 325 MG tablet Take 1 tablet (325 mg total) by mouth daily. 30 tablet 0  . cetirizine (ZYRTEC) 10 MG tablet Take 1 tablet (10 mg total) by mouth 2 (two) times daily. 60 tablet 5  . fluocinonide ointment (LIDEX) 8.00 % Apply 1 application topically 2 (two) times daily. To palms 60 g 0  . ketotifen (ZADITOR) 0.025 % ophthalmic solution Place 1 drop into the right eye 2 (two) times daily. 10 mL 1  . pravastatin (PRAVACHOL) 40 MG tablet Take 1 tablet (40 mg total) by mouth daily. 30 tablet 11   No current facility-administered medications on file prior to visit.     No Known Allergies  Social History   Social History  . Marital status: Married    Spouse name: N/A  . Number of children: N/A  . Years of education: N/A   Occupational History  . Not on file.   Social History Main Topics  . Smoking status: Never Smoker  . Smokeless tobacco: Never Used  . Alcohol use No  . Drug use: No  . Sexual activity: Not on file   Other Topics Concern  . Not on file  Social History Narrative  . No narrative on file    Family History  Problem Relation Age of Onset  . Hypertension Mother   . Diabetes Mother   . Diabetes Father   . Colon cancer Neg Hx     Past Surgical History:  Procedure Laterality Date  . TUBAL LIGATION  1995    ROS: Review of Systems Negative except as stated above  PHYSICAL EXAM: BP 118/81 (BP Location: Left Arm, Patient Position: Sitting, Cuff Size: Large)   Pulse 79   Temp 98.2 F (36.8 C) (Oral)   Resp 18   Ht '5\' 2"'$  (1.575 m)   Wt 198 lb (89.8 kg)   SpO2 96%   BMI 36.21 kg/m   Wt Readings from Last 3 Encounters:  09/29/16 198 lb (89.8 kg)  06/28/16 199 lb 9.6 oz (90.5 kg)  06/16/16 200 lb 12.8 oz (91.1 kg)   Physical Exam General  appearance - alert, well appearing, and in no distress Mental status - alert, oriented to person, place, and time, normal mood, behavior, speech, dress, motor activity, and thought processes Eyes - pupils equal and reactive, extraocular eye movements intact Nose - normal and patent, no erythema, discharge or polyps Mouth - mucous membranes moist, pharynx normal without lesions Neck - supple, no significant adenopathy Chest - clear to auscultation, no wheezes, rales or rhonchi, symmetric air entry Heart - normal rate, regular rhythm, normal S1, S2, no murmurs, rubs, clicks or gallops Extremities - peripheral pulses normal, no pedal edema, no clubbing or cyanosis Diabetic Foot Exam - Simple   Simple Foot Form Visual Inspection No deformities, no ulcerations, no other skin breakdown bilaterally:  Yes Sensation Testing Intact to touch and monofilament testing bilaterally:  Yes Pulse Check Posterior Tibialis and Dorsalis pulse intact bilaterally:  Yes Comments     Foot exam  Depression screen [facility] 2/9 09/29/2016  Decreased Interest 0  Down, Depressed, Hopeless 0  PHQ - 2 Score 0  Altered sleeping 0  Tired, decreased energy 0  Change in appetite 0  Feeling bad or failure about yourself  0  Trouble concentrating 0  Moving slowly or fidgety/restless 0  Suicidal thoughts 0  PHQ-9 Score 0     Chemistry      Component Value Date/Time   NA 143 06/16/2016 1053   K 4.6 06/16/2016 1053   CL 102 06/16/2016 1053   CO2 26 06/16/2016 1053   BUN 11 06/16/2016 1053   CREATININE 0.74 06/16/2016 1053   CREATININE 0.58 12/31/2015 1430      Component Value Date/Time   CALCIUM 9.8 06/16/2016 1053   ALKPHOS 99 06/16/2016 1053   AST 26 06/16/2016 1053   ALT 36 (H) 06/16/2016 1053   BILITOT 0.7 06/16/2016 1053     Lab Results  Component Value Date   WBC 5.4 06/16/2016   HGB 13.7 06/16/2016   HCT 41.3 06/16/2016   MCV 87 06/16/2016   PLT 287 06/16/2016   Results for orders placed or  performed in visit on 09/29/16  POCT glucose (manual entry)  Result Value Ref Range   POC Glucose 129 (A) 70 - 99 mg/dl  POCT glycosylated hemoglobin (Hb A1C)  Result Value Ref Range   Hemoglobin A1C 6.8     ASSESSMENT AND PLAN: 1. Controlled type 2 diabetes mellitus without complication, without long-term current use of insulin (HCC) -at goal -Continue metformin. Continue healthy eating habits and regular exercise. -encourage her to get her annual eye exam. No insurance. [employer]  and [employer] reasonably priced - POCT glucose (manual entry) - POCT glycosylated hemoglobin (Hb A1C) - metFORMIN (GLUCOPHAGE) 500 MG tablet; Take 1 tablet (500 mg total) by mouth 2 (two) times daily with a meal. Dose decreased  Dispense: 60 tablet; Refill: 11 - Blood Glucose Monitoring Suppl (TRUE METRIX METER) w/Device KIT; Use as directed  Dispense: 1 kit; Refill: 0 - glucose blood (TRUE METRIX BLOOD GLUCOSE TEST) test strip; Use as instructed  Dispense: 100 each; Refill: 12 - TRUEPLUS LANCETS 28G MISC; Use as directed  Dispense: 100 each; Refill: 1  2. GERD without esophagitis -Given the side effect of dry mouth that she is having from the medication I recommend decrease dose to 20 mg and use QOD PRN. Gerd precautions discussed and risks associated with long term use of PPIs - pantoprazole (PROTONIX) 20 MG tablet; 1 tab PO QOD PRN  Dispense: 30 tablet; Refill: 2  3. Acute midline low back pain without sciatica -this has already resolved but I still discuss proper bending methods to prevent future issues. Bend with your knees not your back  4. Obesity (BMI 30-39.9) 5. NAFLD (nonalcoholic fatty liver disease) -discussed importance of weight loss.  -encourage her to make lunch at home at take with her to work. -will be due to #3 of hep vaccine in 12/2016  6. Need for influenza vaccination - Flu Vaccine QUAD 6+ mos PF IM (Fluarix Quad PF)  7. Need for Tdap vaccination - Tdap vaccine greater than or equal  to 7yo IM  8. Acute intractable tension-type headache -recommend Tylenol PRN   Patient was given the opportunity to ask questions.  Patient verbalized understanding of the plan and was able to repeat key elements of the plan.   Orders Placed This Encounter  Procedures  . Flu Vaccine QUAD 6+ mos PF IM (Fluarix Quad PF)  . Tdap vaccine greater than or equal to 7yo IM  . POCT glucose (manual entry)  . POCT glycosylated hemoglobin (Hb A1C)     Requested Prescriptions   Signed Prescriptions Disp Refills  . pantoprazole (PROTONIX) 20 MG tablet 30 tablet 2    Sig: 1 tab PO QOD PRN  . metFORMIN (GLUCOPHAGE) 500 MG tablet 60 tablet 11    Sig: Take 1 tablet (500 mg total) by mouth 2 (two) times daily with a meal. Dose decreased  . Blood Glucose Monitoring Suppl (TRUE METRIX METER) w/Device KIT 1 kit 0    Sig: Use as directed  . glucose blood (TRUE METRIX BLOOD GLUCOSE TEST) test strip 100 each 12    Sig: Use as instructed  . TRUEPLUS LANCETS 28G MISC 100 each 1    Sig: Use as directed    Return in about 3 months (around 12/29/2016).  Karle Plumber, MD, FACP

## 2016-09-29 NOTE — Patient Instructions (Addendum)
Vacuna Td (contra la difteria y el ttanos): Lo que debe saber (Td Vaccine [Tetanus and Diphtheria]: What You Need to Know) 1. Por qu vacunarse? El ttanos y la difteria son enfermedades muy graves. Son poco frecuentes en los [place] actualmente, pero las personas que se infectan suelen tener complicaciones graves. La vacuna Td se usa para proteger a los adolescentes y a los adultos de ambas enfermedades. Tanto el ttanos como la difteria son infecciones causadas por bacterias. La difteria se transmite de persona a persona a travs de la tos o el estornudo. La bacteria que causa el ttanos entra al cuerpo a travs de cortes, raspones o heridas. El TTANOS (trismo) provoca entumecimiento y contraccin dolorosa de los msculos, por lo general, en todo el cuerpo.  Puede causar el endurecimiento de los msculos de la cabeza y el cuello, de modo que impide abrir la boca, tragar y en algunos casos, respirar. El ttanos es causa de muerte en aproximadamente 1de cada 10personas que contraen la infeccin, incluso despus de que reciben la mejor atencin mdica. La DIFTERIA puede hacer que se forme una membrana gruesa en la parte posterior de la garganta.  Puede causar problemas respiratorios, parlisis, insuficiencia cardaca e incluso la muerte. Antes de las vacunas, en los [place] se informaban 200000 casos de difteria y cientos de casos de ttanos cada ao. Desde que comenz la vacunacin, los informes de casos de ambas enfermedades se han reducido en un 99%. 2. Vacuna Td La vacuna Td protege a adolescentes y adultos contra el ttanos y la difteria. La vacuna Td habitualmente se aplica como dosis de refuerzo cada 10aos, pero tambin puede administrarse antes si la persona sufre una quemadura o herida sucia y grave. A veces, en lugar de la vacuna Td, se recomienda una vacuna llamada Tdap, que protege contra la tosferina, adems de proteger contra el ttanos y la difteria. El mdico o la  persona que le aplique la vacuna puede darle ms informacin al respecto. La Td puede administrarse de manera segura simultneamente con otras vacunas. 3. Algunas personas no deben recibir la vacuna  Una persona que alguna vez ha tenido una reaccin alrgica potencialmente mortal a una dosis anterior de cualquier vacuna contra el ttanos o la difteria, O que tenga una alergia grave a cualquier parte de esta vacuna, no debe recibir la vacuna Td. Informe a la persona que le aplica la vacuna si usted tiene cualquier alergia grave.  Consulte con su mdico si: ? tuvo hinchazn o dolor intenso despus de recibir cualquier vacuna contra la difteria o el ttanos, ? alguna vez ha sufrido el sndrome de Guillain-Barr, ? no se siente bien el da en que se ha programado la vacuna.  4. Riesgos de una reaccin a la vacuna Con cualquier medicamento, incluyendo las vacunas, existe la posibilidad de que aparezcan efectos secundarios. Suelen ser leves y desaparecen por s solos. Tambin son posibles las reacciones graves, pero en raras ocasiones. La mayora de las personas a las que se les aplica la vacuna Td no tienen ningn problema. Problemas leves despus de la vacuna Td: (No interfirieron en otras actividades)  Dolor en el lugar donde se aplic la vacuna (alrededor de 8de cada 10personas)  Enrojecimiento o hinchazn en el lugar donde se aplic la vacuna (alrededor de 1de cada 4personas)  Fiebre leve (poco frecuente)  Dolor de cabeza (alrededor de 1de cada 4personas)  Cansancio (alrededor de 1de cada 4personas) Problemas moderados despus de la vacuna Td: (Interfirieron en otras actividades,   pero no requirieron atencin mdica)  Fiebre superior a 102F (38,8C) (poco frecuente) Problemas graves despus de la vacuna Td: (Impidieron realizar las actividades habituales; requirieron atencin mdica)  Hinchazn, dolor intenso, sangrado o enrojecimiento en el brazo en que se aplic la vacuna  (poco frecuente). Problemas que podran ocurrir despus de cualquier vacuna:  Las personas a veces se desmayan despus de un procedimiento mdico, incluida la vacunacin. Si permanece sentado o recostado durante 15 minutos puede ayudar a evitar los desmayos y las lesiones causadas por las cadas. Informe al mdico si se siente mareado, tiene cambios en la visin o zumbidos en los odos.  Algunas personas sienten un dolor intenso en el hombro y tienen dificultad para mover el brazo donde se coloc la vacuna. Esto sucede con muy poca frecuencia.  Cualquier medicamento puede causar una reaccin alrgica grave. Dichas reacciones son muy poco frecuentes con una vacuna (se calcula que menos de 1en un milln de dosis) y se producen de unos minutos a unas horas despus de la administracin. Al igual que con cualquier medicamento, existe una probabilidad muy remota de que una vacuna cause una lesin grave o la muerte. Se controla permanentemente la seguridad de las vacunas. Para obtener ms informacin, visite: www.cdc.gov/vaccinesafety/. 5. Qu pasa si hay una reaccin grave? A qu signos debo estar atento?  Observe todo lo que le preocupe, como signos de una reaccin alrgica grave, fiebre muy alta o comportamiento fuera de lo normal. Los signos de una reaccin alrgica grave pueden incluir ronchas, hinchazn de la cara y la garganta, dificultad para respirar, latidos cardacos acelerados, mareos y debilidad. Generalmente, estos comenzaran entre unos pocos minutos y algunas horas despus de la vacunacin. Qu debo hacer?  Si usted piensa que se trata de una reaccin alrgica grave o de otra emergencia que no puede esperar, llame al 911 o dirjase al hospital ms cercano. Sino, llame a su mdico.  Despus, la reaccin debe informarse al Sistema de Informacin sobre Efectos Adversos de las Vacunas (Vaccine Adverse Event Reporting System, VAERS). Su mdico puede presentar este informe, o puede hacerlo  usted mismo a travs del sitio web de VAERS, en www.vaers.hhs.gov, o llamando al 1-800-822-7967. VAERS no brinda recomendaciones mdicas. 6. Programa Nacional de Compensacin de Daos por Vacunas El Programa Nacional de Compensacin de Daos por Vacunas (National Vaccine Injury Compensation Program, VICP) es un programa federal que fue creado para compensar a las personas que puedan haber sufrido daos al recibir ciertas vacunas. Aquellas personas que consideren que han sufrido un dao como consecuencia de una vacuna y quieran saber ms acerca del programa y de cmo presentar una denuncia, pueden llamar al 1-800-338-2382 o visitar su sitio web en www.hrsa.gov/vaccinecompensation. Hay un lmite de tiempo para presentar un reclamo de compensacin. 7. Cmo puedo obtener ms informacin?  Consulte a su mdico. Este puede darle el prospecto de la vacuna o recomendarle otras fuentes de informacin.  Comunquese con el servicio de salud de su localidad o su estado.  Comunquese con los Centros para el Control y la Prevencin de Enfermedades (Centers for Disease Control and Prevention , CDC). ? Llame al 1-800-232-4636 (1-800-CDC-INFO). ? Visite el sitio web de los CDC en www.cdc.gov/vaccines. Declaracin de informacin sobre la vacuna contra la difteria y el ttanos (Td) de los CDC (04/28/15) Esta informacin no tiene como fin reemplazar el consejo del mdico. Asegrese de hacerle al mdico cualquier pregunta que tenga. Document Released: 04/21/2008 Document Revised: 01/24/2014 Document Reviewed: 04/28/2015 Elsevier Interactive Patient Education  2017 Elsevier   Inc.  Influenza Virus Vaccine injection (Fluarix) Qu es este medicamento? La VACUNA ANTIGRIPAL ayuda a disminuir el riesgo de contraer la influenza, tambin conocida como la gripe. La vacuna solo ayuda a protegerle contra algunas cepas de influenza. Esta vacuna no ayuda a reducir Nurse, adult de contraer influenza pandmica H1N1. Este medicamento  puede ser utilizado para otros usos; si tiene alguna pregunta consulte con su proveedor de atencin mdica o con su farmacutico. MARCAS COMUNES: Fluarix, Fluzone Qu le debo informar a mi profesional de la salud antes de tomar este medicamento? Necesita saber si usted presenta alguno de los siguientes problemas o situaciones: -trastorno de sangrado como hemofilia -fiebre o infeccin -sndrome de Guillain-Barre u otros problemas neurolgicos -problemas del sistema inmunolgico -infeccin por el virus de la inmunodeficiencia humana (VIH) o SIDA -niveles bajos de plaquetas en la sangre -esclerosis mltiple -una Automotive engineer o inusual a las vacunas antigripales, a los huevos, protenas de pollo, al ltex, a la gentamicina, a otros medicamentos, alimentos, colorantes o conservantes -si est embarazada o buscando quedar embarazada -si est amamantando a un beb Cmo debo utilizar este medicamento? Esta vacuna se administra mediante inyeccin por va intramuscular. Lo administra un profesional de [employer]. Recibir una copia de informacin escrita sobre la vacuna antes de cada vacuna. Asegrese de leer este folleto cada vez cuidadosamente. Este folleto puede cambiar con frecuencia. Hable con su pediatra para informarse acerca del uso de este medicamento en nios. Puede requerir atencin especial. Sobredosis: Pngase en contacto inmediatamente con un centro toxicolgico o una sala de urgencia si usted cree que haya tomado demasiado medicamento. ATENCIN: [employer] es solo para usted. No comparta este medicamento con nadie. Qu sucede si me olvido de una dosis? No se aplica en este caso. Qu puede interactuar con este medicamento? -quimioterapia o radioterapia -medicamentos que suprimen el sistema inmunolgico, tales como etanercept, anakinra, infliximab y adalimumab -medicamentos que tratan o previenen cogulos sanguneos, como warfarina -fenitona -medicamentos esteroideos, como  la prednisona o la cortisona -teofilina -vacunas Puede ser que esta lista no menciona todas las posibles interacciones. Informe a su profesional de [employer] de [employer] productos a base de hierbas, medicamentos de [place] o suplementos nutritivos que est tomando. Si usted fuma, consume bebidas alcohlicas o si utiliza drogas ilegales, indqueselo tambin a su profesional de [employer]. Algunas sustancias pueden interactuar con su medicamento. A qu debo estar atento al usar [employer]? Informe a su mdico o a Producer, television/film/video de la [employer] todos los efectos secundarios que persistan despus de [address]. Llame a su proveedor de atencin mdica si se presentan sntomas inusuales dentro de las 6 semanas posteriores a la vacunacin. Es posible que todava pueda contraer la gripe, pero la enfermedad no ser tan fuerte como normalmente. No puede contraer la gripe de esta vacuna. La vacuna antigripal no le protege contra resfros u otras enfermedades que pueden causar [place]. Debe vacunarse cada ao. Qu efectos secundarios puedo tener al [employer] este medicamento? Efectos secundarios que debe informar a su mdico o a Producer, television/film/video de la salud tan pronto como sea posible: -Therapist, art como erupcin cutnea, picazn o urticarias, hinchazn de la cara, labios o lengua Efectos secundarios que, por lo general, no requieren atencin mdica (debe informarlos a su mdico o a su profesional de la salud si persisten o si son molestos): -fiebre -dolor de cabeza -molestias y dolores musculares -dolor, sensibilidad, enrojecimiento o Paramedic de la inyeccin -cansancio o debilidad Puede ser [employer]  lista no menciona todos los posibles efectos secundarios. Comunquese a su mdico por asesoramiento mdico Hewlett-Packardsobre los efectos secundarios. Usted puede informar los efectos secundarios a la FDA por telfono al 1-800-FDA-1088. Dnde debo guardar mi medicina? Esta vacuna se administra  solamente en clnicas, farmacias, consultorio mdico u otro consultorio de un profesional de la salud y no Teacher, early years/prenecesitar guardarlo en su domicilio. ATENCIN: Este folleto es un resumen. Puede ser que no cubra toda la posible informacin. Si usted tiene preguntas acerca de esta medicina, consulte con su mdico, su farmacutico o su profesional de Radiographer, therapeuticla salud.  2018 Elsevier/Gold Standard (2009-07-07 15:31:40)

## 2016-10-05 MED FILL — PRAVASTATIN NA 40 MG TAB: 40 | 30 days supply | Qty: 30 | Fill #2

## 2016-10-25 ENCOUNTER — Ambulatory Visit: Payer: Self-pay | Attending: Internal Medicine

## 2016-11-04 MED FILL — ?METFORMIN HCL 500MG TABLET: 500 | 30 days supply | Qty: 60 | Fill #1

## 2016-11-04 MED FILL — PRAVASTATIN NA 40 MG TAB: 40 | 30 days supply | Qty: 30 | Fill #3

## 2016-12-07 MED FILL — ?METFORMIN HCL 500MG TABLET: 500 | 30 days supply | Qty: 60 | Fill #2

## 2016-12-23 MED FILL — PRAVASTATIN NA 40 MG TAB: 40 | 30 days supply | Qty: 30 | Fill #4

## 2016-12-29 ENCOUNTER — Encounter: Payer: Self-pay | Admitting: Internal Medicine

## 2016-12-29 ENCOUNTER — Ambulatory Visit: Payer: Self-pay | Attending: Internal Medicine | Admitting: Internal Medicine

## 2016-12-29 VITALS — BP 113/71 | HR 97 | Temp 98.6°F | Resp 18 | Ht 62.0 in | Wt 200.0 lb

## 2016-12-29 DIAGNOSIS — E119 Type 2 diabetes mellitus without complications: Secondary | ICD-10-CM | POA: Insufficient documentation

## 2016-12-29 DIAGNOSIS — G510 Bell's palsy: Secondary | ICD-10-CM | POA: Insufficient documentation

## 2016-12-29 DIAGNOSIS — K760 Fatty (change of) liver, not elsewhere classified: Secondary | ICD-10-CM | POA: Insufficient documentation

## 2016-12-29 DIAGNOSIS — Z8249 Family history of ischemic heart disease and other diseases of the circulatory system: Secondary | ICD-10-CM | POA: Insufficient documentation

## 2016-12-29 DIAGNOSIS — Z7984 Long term (current) use of oral hypoglycemic drugs: Secondary | ICD-10-CM | POA: Insufficient documentation

## 2016-12-29 DIAGNOSIS — E785 Hyperlipidemia, unspecified: Secondary | ICD-10-CM | POA: Insufficient documentation

## 2016-12-29 DIAGNOSIS — E669 Obesity, unspecified: Secondary | ICD-10-CM | POA: Insufficient documentation

## 2016-12-29 DIAGNOSIS — Z23 Encounter for immunization: Secondary | ICD-10-CM | POA: Insufficient documentation

## 2016-12-29 DIAGNOSIS — Z6830 Body mass index (BMI) 30.0-30.9, adult: Secondary | ICD-10-CM | POA: Insufficient documentation

## 2016-12-29 DIAGNOSIS — Z7982 Long term (current) use of aspirin: Secondary | ICD-10-CM | POA: Insufficient documentation

## 2016-12-29 DIAGNOSIS — K219 Gastro-esophageal reflux disease without esophagitis: Secondary | ICD-10-CM | POA: Insufficient documentation

## 2016-12-29 DIAGNOSIS — Z833 Family history of diabetes mellitus: Secondary | ICD-10-CM | POA: Insufficient documentation

## 2016-12-29 DIAGNOSIS — Z79899 Other long term (current) drug therapy: Secondary | ICD-10-CM | POA: Insufficient documentation

## 2016-12-29 DIAGNOSIS — Z9851 Tubal ligation status: Secondary | ICD-10-CM | POA: Insufficient documentation

## 2016-12-29 LAB — GLUCOSE, POCT (MANUAL RESULT ENTRY): POC Glucose: 157 mg/dl — AB (ref 70–99)

## 2016-12-29 LAB — POCT GLYCOSYLATED HEMOGLOBIN (HGB A1C): HEMOGLOBIN A1C: 7.1

## 2016-12-29 MED ORDER — OMEPRAZOLE 20 MG PO CPDR: 20.0000 mg | DELAYED_RELEASE_CAPSULE | Freq: Every day | ORAL | 5 refills | Status: DC

## 2016-12-29 MED FILL — ?OMEPRAZOLE DR 20MG CAPSULE: 20 | 30 days supply | Qty: 30 | Fill #0

## 2016-12-29 NOTE — Progress Notes (Signed)
Patient ID: [name] [name], female    DOB: 1961/01/06  MRN: [id number]  CC: Follow-up   Subjective: [name] [name] is a 56 y.o. female who presents for chronic ds management. Last seen 09/2016 Her concerns today include:  Pt with hx of DM type 2, obesity, NAFLD, HL, GERD  1.  DM: checks BS once a wk. Does not remember range. -"I'm trying to eat healthy but the acid doesn't let me." Pantoprazole causes dry throat so she does not take it every day.  Gets reflux with certain foods like can fruits and oranges -walks about 2-3 x a wk  2.  HL: Tolerating and compliant with Pravastatin  3. NAFLD: due for 3rd hepatitis vaccine  Patient Active Problem List   Diagnosis Date Noted  . Obesity (BMI 30-39.9) 09/29/2016  . NAFL (nonalcoholic fatty liver) 26/37/8588  . Irritant contact dermatitis due to other chemical products 05/20/2016  . Excessive gas 08/25/2015  . Low back pain 12/05/2014  . Diabetes type 2, controlled ([place]) 10/21/2014  . Hyperlipidemia 11/04/2013  . Environmental allergies 07/12/2013  . Bell's palsy 11/06/2012  . Neck pain 11/06/2012  . GERD 11/04/2005     Current Outpatient Medications on File Prior to Visit  Medication Sig Dispense Refill  . aspirin 325 MG tablet Take 1 tablet (325 mg total) by mouth daily. 30 tablet 0  . Blood Glucose Monitoring Suppl (TRUE METRIX METER) w/Device KIT Use as directed 1 kit 0  . cetirizine (ZYRTEC) 10 MG tablet Take 1 tablet (10 mg total) by mouth 2 (two) times daily. 60 tablet 5  . fluocinonide ointment (LIDEX) 5.02 % Apply 1 application topically 2 (two) times daily. To palms 60 g 0  . glucose blood (TRUE METRIX BLOOD GLUCOSE TEST) test strip Use as instructed 100 each 12  . ketotifen (ZADITOR) 0.025 % ophthalmic solution Place 1 drop into the right eye 2 (two) times daily. 10 mL 1  . metFORMIN (GLUCOPHAGE) 500 MG tablet Take 1 tablet (500 mg total) by mouth 2 (two) times daily with a meal. Dose decreased 60  tablet 11  . pravastatin (PRAVACHOL) 40 MG tablet Take 1 tablet (40 mg total) by mouth daily. 30 tablet 11  . TRUEPLUS LANCETS 28G MISC Use as directed 100 each 1   No current facility-administered medications on file prior to visit.     No Known Allergies  Social History   Socioeconomic History  . Marital status: Married    Spouse name: Not on file  . Number of children: Not on file  . Years of education: Not on file  . Highest education level: Not on file  Social Needs  . Financial resource strain: Not on file  . Food insecurity - worry: Not on file  . Food insecurity - inability: Not on file  . Transportation needs - medical: Not on file  . Transportation needs - non-medical: Not on file  Occupational History  . Not on file  Tobacco Use  . Smoking status: Never Smoker  . Smokeless tobacco: Never Used  Substance and Sexual Activity  . Alcohol use: No  . Drug use: No  . Sexual activity: Not on file  Other Topics Concern  . Not on file  Social History Narrative  . Not on file    Family History  Problem Relation Age of Onset  . Hypertension Mother   . Diabetes Mother   . Diabetes Father   . Colon cancer Neg Hx     Past  Surgical History:  Procedure Laterality Date  . TUBAL LIGATION  1995    ROS: Review of Systems  Respiratory: Negative for chest tightness and shortness of breath.   Cardiovascular: Negative for chest pain.    PHYSICAL EXAM: BP 113/71 (BP Location: Left Arm, Patient Position: Sitting, Cuff Size: Normal)   Pulse 97   Temp 98.6 F (37 C) (Oral)   Resp 18   Ht '5\' 2"'$  (1.575 m)   Wt 200 lb (90.7 kg)   SpO2 100%   BMI 36.58 kg/m   Physical Exam  General appearance - alert, well appearing, and in no distress Mental status - alert, oriented to person, place, and time, normal mood, behavior, speech, dress, motor activity, and thought processes Neck - supple, no significant adenopathy Chest - clear to auscultation, no wheezes, rales or  rhonchi, symmetric air entry Heart - normal rate, regular rhythm, normal S1, S2, no murmurs, rubs, clicks or gallops Extremities - peripheral pulses normal, no pedal edema, no clubbing or cyanosis  Lab Results  Component Value Date   HGBA1C 7.1 12/29/2016   BS 157   Chemistry      Component Value Date/Time   NA 143 06/16/2016 1053   K 4.6 06/16/2016 1053   CL 102 06/16/2016 1053   CO2 26 06/16/2016 1053   BUN 11 06/16/2016 1053   CREATININE 0.74 06/16/2016 1053   CREATININE 0.58 12/31/2015 1430      Component Value Date/Time   CALCIUM 9.8 06/16/2016 1053   ALKPHOS 99 06/16/2016 1053   AST 26 06/16/2016 1053   ALT 36 (H) 06/16/2016 1053   BILITOT 0.7 06/16/2016 1053      ASSESSMENT AND PLAN: 1. Controlled type 2 diabetes mellitus without complication, without long-term current use of insulin (HCC) Continue metformin. Discussed healthy eating habits.  Advise eating fresh fruits rather than can fruits -Reminded her to try to get an eye exam done - Glucose (CBG) - HgB A1c  2. GERD without esophagitis GERD precautions discussed.  Patient advised to avoid acidic foods like oranges. Since she is not tolerating pantoprazole, will change to omeprazole - omeprazole (PRILOSEC) 20 MG capsule; Take 1 capsule (20 mg total) by mouth daily.  Dispense: 30 capsule; Refill: 5  3. Obesity (BMI 30-39.9) Encouraged her to continue walking 3-4 times a week for at least 30-45 minutes  4. NAFLD (nonalcoholic fatty liver disease) Due for last hep B vaccine in the series - Hepatitis B vaccine adult IM  Patient was given the opportunity to ask questions.  Patient verbalized understanding of the plan and was able to repeat key elements of the plan.  [employer] interpreter used during this encounter.  Orders Placed This Encounter  Procedures  . Glucose (CBG)  . HgB A1c     Requested Prescriptions   Pending Prescriptions Disp Refills  . omeprazole (PRILOSEC) 20 MG capsule 30 capsule 5     Sig: Take 1 capsule (20 mg total) by mouth daily.    Follow-up in 3 months or sooner if any problems occur Karle Plumber, MD, [facility]al[facility]ss[facility]

## 2017-01-11 MED FILL — ?METFORMIN HCL 500MG TABLET: 500 | 30 days supply | Qty: 60 | Fill #3

## 2017-02-10 MED FILL — ?PRAVASTATIN SODIUM 40MG TA: 40 | 30 days supply | Qty: 30 | Fill #5

## 2017-02-10 MED FILL — ?METFORMIN HCL 500MG TABLET: 500 | 30 days supply | Qty: 60 | Fill #4

## 2017-02-23 ENCOUNTER — Other Ambulatory Visit: Payer: Self-pay | Admitting: Internal Medicine

## 2017-03-02 MED FILL — ?OMEPRAZOLE DR 20 MG CAPSUL: 20 | 30 days supply | Qty: 30 | Fill #1

## 2017-03-13 ENCOUNTER — Other Ambulatory Visit: Payer: Self-pay | Admitting: Internal Medicine

## 2017-03-13 DIAGNOSIS — Z1231 Encounter for screening mammogram for malignant neoplasm of breast: Secondary | ICD-10-CM

## 2017-03-17 MED FILL — ?METFORMIN HCL 500MG TABLET: 500 | 30 days supply | Qty: 60 | Fill #5

## 2017-03-30 ENCOUNTER — Encounter: Payer: Self-pay | Admitting: Internal Medicine

## 2017-03-30 ENCOUNTER — Ambulatory Visit: Payer: Self-pay | Attending: Internal Medicine | Admitting: Internal Medicine

## 2017-03-30 VITALS — BP 116/75 | HR 78 | Temp 98.2°F | Resp 16 | Wt 203.4 lb

## 2017-03-30 DIAGNOSIS — Z79899 Other long term (current) drug therapy: Secondary | ICD-10-CM | POA: Insufficient documentation

## 2017-03-30 DIAGNOSIS — E785 Hyperlipidemia, unspecified: Secondary | ICD-10-CM | POA: Insufficient documentation

## 2017-03-30 DIAGNOSIS — Z6837 Body mass index (BMI) 37.0-37.9, adult: Secondary | ICD-10-CM | POA: Insufficient documentation

## 2017-03-30 DIAGNOSIS — K760 Fatty (change of) liver, not elsewhere classified: Secondary | ICD-10-CM | POA: Insufficient documentation

## 2017-03-30 DIAGNOSIS — E669 Obesity, unspecified: Secondary | ICD-10-CM | POA: Insufficient documentation

## 2017-03-30 DIAGNOSIS — Z7984 Long term (current) use of oral hypoglycemic drugs: Secondary | ICD-10-CM | POA: Insufficient documentation

## 2017-03-30 DIAGNOSIS — E1165 Type 2 diabetes mellitus with hyperglycemia: Secondary | ICD-10-CM | POA: Insufficient documentation

## 2017-03-30 DIAGNOSIS — IMO0001 Reserved for inherently not codable concepts without codable children: Secondary | ICD-10-CM

## 2017-03-30 DIAGNOSIS — K219 Gastro-esophageal reflux disease without esophagitis: Secondary | ICD-10-CM | POA: Insufficient documentation

## 2017-03-30 DIAGNOSIS — Z7982 Long term (current) use of aspirin: Secondary | ICD-10-CM | POA: Insufficient documentation

## 2017-03-30 LAB — POCT GLYCOSYLATED HEMOGLOBIN (HGB A1C): HEMOGLOBIN A1C: 7.3

## 2017-03-30 LAB — GLUCOSE, POCT (MANUAL RESULT ENTRY): POC Glucose: 145 mg/dl — AB (ref 70–99)

## 2017-03-30 MED ORDER — METFORMIN HCL 500 MG PO TABS: ORAL_TABLET | ORAL | 11 refills | Status: DC

## 2017-03-30 MED ORDER — PRAVASTATIN SODIUM 40 MG PO TABS: 40.0000 mg | ORAL_TABLET | Freq: Every day | ORAL | 11 refills | Status: DC

## 2017-03-30 MED FILL — ?PRAVASTATIN SODIUM 40MG TA: 40 | 30 days supply | Qty: 30 | Fill #0

## 2017-03-30 NOTE — Patient Instructions (Signed)
Diabetes mellitus y nutrición  Diabetes Mellitus and Nutrition  Si sufre de diabetes (diabetes mellitus), es muy importante tener hábitos alimenticios saludables debido a que sus niveles de azúcar en la sangre (glucosa) se ven afectados en gran medida por lo que come y bebe. Comer alimentos saludables en las cantidades adecuadas, aproximadamente a la misma hora todos los días, lo ayudará a:  · Controlar la glucemia.  · Disminuir el riesgo de sufrir una enfermedad cardíaca.  · Mejorar la presión arterial.  · Alcanzar o mantener un peso saludable.    Todas las personas que sufren de diabetes son diferentes y cada una tiene necesidades diferentes en cuanto a un plan de alimentación. El médico puede recomendarle que trabaje con un especialista en dietas y nutrición (nutricionista) para elaborar el mejor plan para usted. Su plan de alimentación puede variar según factores como:  · Las calorías que necesita.  · Los medicamentos que toma.  · Su peso.  · Sus niveles de glucemia, presión arterial y colesterol.  · Su nivel de actividad.  · Otras afecciones que tenga, como enfermedades cardíacas o renales.    ¿Cómo me afectan los carbohidratos?  Los carbohidratos afectan el nivel de glucemia más que cualquier otro tipo de alimento. La ingesta de carbohidratos naturalmente aumenta la cantidad glucosa en la sangre. El recuento de carbohidratos es un método destinado a llevar un registro de la cantidad de carbohidratos que se ingieren. El recuento de carbohidratos es importante para mantener la glucemia a un nivel saludable, en especial si utiliza insulina o toma determinados medicamentos por vía oral para la diabetes.  Es importante saber la cantidad de carbohidratos que se pueden ingerir en cada comida sin correr ningún riesgo. Esto es diferente en cada persona. El nutricionista puede ayudarlo a calcular la cantidad de carbohidratos que debe ingerir en cada comida y colación.   Los alimentos que contienen carbohidratos incluyen:  · Pan, cereal, arroz, pasta y galletas.  · Papas y maíz.  · Guisantes, frijoles y lentejas.  · Leche y yogur.  · Frutas y jugo.  · Postres, como pasteles, galletitas, helado y caramelos.    ¿Cómo me afecta el alcohol?  El alcohol puede provocar disminuciones súbitas de la glucemia (hipoglucemia), en especial si utiliza insulina o toma determinados medicamentos por vía oral para la diabetes. La hipoglucemia es una afección potencialmente mortal. Los síntomas de la hipoglucemia (somnolencia, mareos y confusión) son similares a los síntomas de haber consumido demasiado alcohol.  Si el médico afirma que el alcohol es seguro para usted, siga estas pautas:  · Limite el consumo de alcohol a no más de 1 medida por día si es mujer y no está embarazada, y a 2 medidas si es hombre. Una medida equivale a 12 oz (355 ml) de cerveza, 5 oz (148 ml) de vino o 1½ oz (44 ml) de bebidas de alta graduación alcohólica.  · No beba con el estómago vacío.  · Manténgase hidratado con agua, gaseosas dietéticas o té helado sin azúcar.  · Tenga en cuenta que las gaseosas comunes, los jugos y otros refrescos pueden contener mucha azúcar y se deben contar como carbohidratos.    Consejos para seguir este plan  Leer las etiquetas de los alimentos  · Comience por controlar el tamaño de la porción en la etiqueta. La cantidad de calorías, carbohidratos, grasas y otros nutrientes mencionados en la etiqueta se basan en una porción del alimento. Muchos alimentos contienen más de una porción por envase.  · Verifique la cantidad total de gramos (g)   de carbohidratos totales en una porción. Puede calcular la cantidad de porciones de carbohidratos al dividir el total de carbohidratos por 15. Por ejemplo, si un alimento posee un total de 30 g de carbohidratos, equivale a 2 porciones de carbohidratos.  · Verifique la cantidad de gramos (g) de grasas saturadas y grasas trans  en una porción. Escoja alimentos que no contengan grasa o que tengan un bajo contenido.  · Controle la cantidad de miligramos (mg) de sodio en una porción. La mayoría de las personas deben limitar la ingesta de sodio total a menos de 2300 mg por día.  · Siempre consulte la información nutricional de los alimentos etiquetados como “con bajo contenido de grasa” o “sin grasa”. Estos alimentos pueden ser más altos en azúcar agregada o en carbohidratos refinados y deben evitarse.  · Hable con el nutricionista para identificar sus objetivos diarios en cuanto a los nutrientes mencionados en la etiqueta.  De compras  · Evite comprar alimentos procesados, enlatados o prehechos. Estos alimentos tienden a tener mayor cantidad de grasa, sodio y azúcar agregada.  · Compre en la zona exterior de la tienda de comestibles. Esta incluye frutas y vegetales frescos, granos a granel, carnes frescas y productos lácteos frescos.  Cocción  · Utilice métodos de cocción a baja temperatura, como hornear, en lugar de métodos de cocción a alta temperatura, como freír en abundante aceite.  · Cocine con aceites saludables, como el aceite de oliva, canola o girasol.  · Evite cocinar con manteca, crema o carnes con alto contenido de grasa.  Planificación de las comidas  · Consuma las comidas y las colaciones de forma regular, preferentemente a la misma hora todos los días. Evite pasar largos períodos de tiempo sin comer.  · Consuma alimentos ricos en fibra, como frutas frescas, verduras, frijoles y cereales integrales. Consulte al nutricionista sobre cuántas porciones de carbohidratos puede consumir en cada comida.  · Consuma entre 4 y 6 onzas de proteínas magras por día, como carnes magras, pollo, pescado, huevos o tofu. 1 onza equivale a 1 onza de carne, pollo o pescado, 1 huevo, o 1/4 taza de tofu.  · Coma algunos alimentos por día que contengan grasas saludables, como aguacates, frutos secos, semillas y pescado.  Estilo de vida     · Controle su nivel de glucemia con regularidad.  · Haga ejercicio al menos 30 minutos, 5 días o más por semana, o como se lo haya indicado el médico.  · Tome los medicamentos como se lo haya indicado el médico.  · No consuma ningún producto que contenga nicotina o tabaco, como cigarrillos y cigarrillos electrónicos. Si necesita ayuda para dejar de fumar, consulte al médico.  · Trabaje con un asesor o instructor en diabetes para identificar estrategias para controlar el estrés y cualquier desafío emocional y social.  ¿Cuáles son algunas de las preguntas que puedo hacerle a mi médico?  · ¿Es necesario que me reúna con un instructor en diabetes?  · ¿Es necesario que me reúna con un nutricionista?  · ¿A qué número puedo llamar si tengo preguntas?  · ¿Cuáles son los mejores momentos para controlar la glucemia?  Dónde encontrar más información:  · Asociación Americana de la Diabetes (American Diabetes Association): diabetes.org/food-and-fitness/food  · Academia de Nutrición y Dietética (Academy of Nutrition and Dietetics): www.eatright.org/resources/health/diseases-and-conditions/diabetes  · Instituto Nacional de la Diabetes y las Enfermedades Digestivas y Renales (National Institute of Diabetes and Digestive and Kidney Diseases) (Institutos Nacionales de Salud, NIH): www.niddk.nih.gov/health-information/diabetes/overview/diet-eating-physical-activity  Resumen  · Un plan de alimentación saludable   lo ayudará a controlar la glucemia y mantener un estilo de vida saludable.  · Trabajar con un especialista en dietas y nutrición (nutricionista) puede ayudarlo a elaborar el mejor plan de alimentación para usted.  · Tenga en cuenta que los carbohidratos y el alcohol tienen efectos inmediatos en sus niveles de glucemia. Es importante contar los carbohidratos y consumir alcohol con prudencia.  Esta información no tiene como fin reemplazar el consejo del médico. Asegúrese de hacerle al médico cualquier pregunta que tenga.   Document Released: 04/12/2007 Document Revised: 04/25/2016 Document Reviewed: 04/25/2016  Elsevier Interactive Patient Education © 2018 Elsevier Inc.

## 2017-03-30 NOTE — Progress Notes (Signed)
Patient ID: [name] [name], female    DOB: 08-Aug-1960  MRN: [id number]  CC: Diabetes   Subjective: [name] [name] is a 57 y.o. female who presents for chronic ds management. Last seen 12/2016 Her concerns today include:  Pt with hx of DM type 2, obesity, NAFLD, HL, GERD  DM: "I don't like that I keep gaining wgh."  She started walking 10-15 mins daily. She does not eat bread and greasy foods. Admits that she still eats a lot of rice and tortias.  Drinks mainly water and tea.  -checks BS occasionally Compliant with Metformin. Due for eye exam No numbness in feet, no polyuria or polydipsia Compliant with Pravastatin Patient Active Problem List   Diagnosis Date Noted  . Obesity (BMI 30-39.9) 09/29/2016  . NAFL (nonalcoholic fatty liver) 09/98/3382  . Irritant contact dermatitis due to other chemical products 05/20/2016  . Diabetes type 2, controlled ([place]) 10/21/2014  . Hyperlipidemia 11/04/2013  . Environmental allergies 07/12/2013  . Bell's palsy 11/06/2012  . GERD 11/04/2005     Current Outpatient Medications on File Prior to Visit  Medication Sig Dispense Refill  . aspirin 325 MG tablet Take 1 tablet (325 mg total) by mouth daily. 30 tablet 0  . Blood Glucose Monitoring Suppl (TRUE METRIX METER) w/Device KIT Use as directed 1 kit 0  . cetirizine (ZYRTEC) 10 MG tablet Take 1 tablet (10 mg total) by mouth 2 (two) times daily. 60 tablet 5  . fluocinonide ointment (LIDEX) 5.05 % Apply 1 application topically 2 (two) times daily. To palms 60 g 0  . glucose blood (TRUE METRIX BLOOD GLUCOSE TEST) test strip Use as instructed 100 each 12  . ketotifen (ZADITOR) 0.025 % ophthalmic solution Place 1 drop into the right eye 2 (two) times daily. 10 mL 1  . omeprazole (PRILOSEC) 20 MG capsule Take 1 capsule (20 mg total) by mouth daily. 30 capsule 5  . TRUEPLUS LANCETS 28G MISC Use as directed 100 each 1   No current facility-administered medications on file prior to  visit.     No Known Allergies  Social History   Socioeconomic History  . Marital status: Married    Spouse name: Not on file  . Number of children: Not on file  . Years of education: Not on file  . Highest education level: Not on file  Social Needs  . Financial resource strain: Not on file  . Food insecurity - worry: Not on file  . Food insecurity - inability: Not on file  . Transportation needs - medical: Not on file  . Transportation needs - non-medical: Not on file  Occupational History  . Not on file  Tobacco Use  . Smoking status: Never Smoker  . Smokeless tobacco: Never Used  Substance and Sexual Activity  . Alcohol use: No  . Drug use: No  . Sexual activity: Not on file  Other Topics Concern  . Not on file  Social History Narrative  . Not on file    Family History  Problem Relation Age of Onset  . Hypertension Mother   . Diabetes Mother   . Diabetes Father   . Colon cancer Neg Hx     Past Surgical History:  Procedure Laterality Date  . TUBAL LIGATION  1995    ROS: Review of Systems Neg except as above PHYSICAL EXAM: BP 116/75   Pulse 78   Temp 98.2 F (36.8 C) (Oral)   Resp 16   Wt 203 lb 6.4 oz (  92.3 kg)   SpO2 97%   BMI 37.20 kg/m   Wt Readings from Last 3 Encounters:  03/30/17 203 lb 6.4 oz (92.3 kg)  12/29/16 200 lb (90.7 kg)  09/29/16 198 lb (89.8 kg)    Physical Exam  General appearance - alert, well appearing, and in no distress Mental status - alert, oriented to person, place, and time, normal mood, behavior, speech, dress, motor activity, and thought processes Neck - supple, no significant adenopathy Chest - clear to auscultation, no wheezes, rales or rhonchi, symmetric air entry Heart - normal rate, regular rhythm, normal S1, S2, no murmurs, rubs, clicks or gallops Extremities - peripheral pulses normal, no pedal edema, no clubbing or cyanosis Diabetic Foot Exam - Simple   Simple Foot Form Visual Inspection No deformities,  no ulcerations, no other skin breakdown bilaterally:  Yes Sensation Testing Intact to touch and monofilament testing bilaterally:  Yes Pulse Check Posterior Tibialis and Dorsalis pulse intact bilaterally:  Yes Comments     Results for orders placed or performed in visit on 03/30/17  POCT glucose (manual entry)  Result Value Ref Range   POC Glucose 145 (A) 70 - 99 mg/dl  POCT glycosylated hemoglobin (Hb A1C)  Result Value Ref Range   Hemoglobin A1C 7.3     ASSESSMENT AND PLAN: 1. Diabetes mellitus type 2, uncontrolled, without complications (HCC) Patient close to goal. Increase metformin to 1 g in the a.m. and 500 mg p.m Commended her on trying to exercise daily.  Recommended increased exercise time to 20-30 minutes.  Encourage her to cut back more on the white carbohydrates - POCT glucose (manual entry) - POCT glycosylated hemoglobin (Hb A1C) - Microalbumin / creatinine urine ratio - pravastatin (PRAVACHOL) 40 MG tablet; Take 1 tablet (40 mg total) by mouth daily.  Dispense: 30 tablet; Refill: 11 - CBC - Lipid panel - Comprehensive metabolic panel - metFORMIN (GLUCOPHAGE) 500 MG tablet; 2 tabs PO Q a.m and 1 Q p.m  Dispense: 90 tablet; Refill: 11  2. Hyperlipidemia, unspecified hyperlipidemia type - pravastatin (PRAVACHOL) 40 MG tablet; Take 1 tablet (40 mg total) by mouth daily.  Dispense: 30 tablet; Refill: 11  3. Obesity (BMI 30-39.9) See discussion under #1   Patient was given the opportunity to ask questions.  Patient verbalized understanding of the plan and was able to repeat key elements of the plan.   Orders Placed This Encounter  Procedures  . Microalbumin / creatinine urine ratio  . CBC  . Lipid panel  . Comprehensive metabolic panel  . POCT glucose (manual entry)  . POCT glycosylated hemoglobin (Hb A1C)     Requested Prescriptions   Signed Prescriptions Disp Refills  . pravastatin (PRAVACHOL) 40 MG tablet 30 tablet 11    Sig: Take 1 tablet (40 mg  total) by mouth daily.  . metFORMIN (GLUCOPHAGE) 500 MG tablet 90 tablet 11    Sig: 2 tabs PO Q a.m and 1 Q p.m    Return in about 3 months (around 06/30/2017).  Karle Plumber, MD, FACP

## 2017-03-31 ENCOUNTER — Telehealth: Payer: Self-pay

## 2017-03-31 LAB — CBC
HEMATOCRIT: 39.5 % (ref 34.0–46.6)
HEMOGLOBIN: 13.4 g/dL (ref 11.1–15.9)
MCH: 28.9 pg (ref 26.6–33.0)
MCHC: 33.9 g/dL (ref 31.5–35.7)
MCV: 85 fL (ref 79–97)
Platelets: 287 10*3/uL (ref 150–379)
RBC: 4.63 x10E6/uL (ref 3.77–5.28)
RDW: 13.9 % (ref 12.3–15.4)
WBC: 5.7 10*3/uL (ref 3.4–10.8)

## 2017-03-31 LAB — COMPREHENSIVE METABOLIC PANEL
ALT: 43 IU/L — AB (ref 0–32)
AST: 32 IU/L (ref 0–40)
Albumin/Globulin Ratio: 1.7 (ref 1.2–2.2)
Albumin: 4.5 g/dL (ref 3.5–5.5)
Alkaline Phosphatase: 98 IU/L (ref 39–117)
BILIRUBIN TOTAL: 0.6 mg/dL (ref 0.0–1.2)
BUN/Creatinine Ratio: 14 (ref 9–23)
BUN: 8 mg/dL (ref 6–24)
CHLORIDE: 102 mmol/L (ref 96–106)
CO2: 26 mmol/L (ref 20–29)
Calcium: 9.7 mg/dL (ref 8.7–10.2)
Creatinine, Ser: 0.58 mg/dL (ref 0.57–1.00)
GFR calc non Af Amer: 103 mL/min/{1.73_m2} (ref >59)
GFR, EST AFRICAN AMERICAN: 119 mL/min/{1.73_m2} (ref >59)
Globulin, Total: 2.7 g/dL (ref 1.5–4.5)
Glucose: 115 mg/dL — ABNORMAL HIGH (ref 65–99)
POTASSIUM: 4.1 mmol/L (ref 3.5–5.2)
SODIUM: 141 mmol/L (ref 134–144)
Total Protein: 7.2 g/dL (ref 6.0–8.5)

## 2017-03-31 LAB — MICROALBUMIN / CREATININE URINE RATIO
Creatinine, Urine: 44.5 mg/dL
Microalbumin, Urine: <3 ug/mL

## 2017-03-31 LAB — LIPID PANEL
CHOL/HDL RATIO: 4.9 ratio — AB (ref 0.0–4.4)
Cholesterol, Total: 228 mg/dL — ABNORMAL HIGH (ref 100–199)
HDL: 47 mg/dL (ref >39)
LDL CALC: 109 mg/dL — AB (ref 0–99)
TRIGLYCERIDES: 361 mg/dL — AB (ref 0–149)
VLDL Cholesterol Cal: 72 mg/dL — ABNORMAL HIGH (ref 5–40)

## 2017-03-31 NOTE — Telephone Encounter (Signed)
[employer] Asher MuirJamie [phone]#[id number] contacted pt to go over lab results spoke with pt husband and gave results. Pt husband doesn't have any questions or concerns and will relay results to pt

## 2017-04-12 LAB — HM DIABETES EYE EXAM

## 2017-04-26 ENCOUNTER — Ambulatory Visit: Payer: Self-pay | Attending: Internal Medicine

## 2017-04-26 MED FILL — PRAVASTATIN NA 40 MG TAB: 40 | 30 days supply | Qty: 30 | Fill #1

## 2017-04-26 MED FILL — metFORMIN HCL 500 MG TABS: 500 | 30 days supply | Qty: 90 | Fill #0

## 2017-05-09 ENCOUNTER — Ambulatory Visit
Admission: RE | Admit: 2017-05-09 | Discharge: 2017-05-09 | Disposition: A | Discharge status: Home / Self Care | Payer: No Typology Code available for payment source | Source: Ambulatory Visit | Attending: Internal Medicine | Admitting: Internal Medicine

## 2017-05-09 DIAGNOSIS — Z1231 Encounter for screening mammogram for malignant neoplasm of breast: Secondary | ICD-10-CM

## 2017-05-09 MED FILL — OMEPRAZOLE 20 MG CAP: 20 | 30 days supply | Qty: 30 | Fill #2

## 2017-05-29 MED FILL — PRAVASTATIN NA 40 MG TAB: 40 | 30 days supply | Qty: 30 | Fill #2

## 2017-06-22 ENCOUNTER — Ambulatory Visit: Payer: Self-pay | Attending: Internal Medicine | Admitting: Internal Medicine

## 2017-06-22 ENCOUNTER — Encounter: Payer: Self-pay | Admitting: Internal Medicine

## 2017-06-22 DIAGNOSIS — Z7984 Long term (current) use of oral hypoglycemic drugs: Secondary | ICD-10-CM | POA: Insufficient documentation

## 2017-06-22 DIAGNOSIS — E119 Type 2 diabetes mellitus without complications: Secondary | ICD-10-CM | POA: Insufficient documentation

## 2017-06-22 DIAGNOSIS — K219 Gastro-esophageal reflux disease without esophagitis: Secondary | ICD-10-CM | POA: Insufficient documentation

## 2017-06-22 DIAGNOSIS — E1165 Type 2 diabetes mellitus with hyperglycemia: Secondary | ICD-10-CM

## 2017-06-22 DIAGNOSIS — Z8249 Family history of ischemic heart disease and other diseases of the circulatory system: Secondary | ICD-10-CM | POA: Insufficient documentation

## 2017-06-22 DIAGNOSIS — Z6837 Body mass index (BMI) 37.0-37.9, adult: Secondary | ICD-10-CM | POA: Insufficient documentation

## 2017-06-22 DIAGNOSIS — Z833 Family history of diabetes mellitus: Secondary | ICD-10-CM | POA: Insufficient documentation

## 2017-06-22 DIAGNOSIS — Z79899 Other long term (current) drug therapy: Secondary | ICD-10-CM | POA: Insufficient documentation

## 2017-06-22 DIAGNOSIS — IMO0001 Reserved for inherently not codable concepts without codable children: Secondary | ICD-10-CM

## 2017-06-22 DIAGNOSIS — E785 Hyperlipidemia, unspecified: Secondary | ICD-10-CM | POA: Insufficient documentation

## 2017-06-22 DIAGNOSIS — Z7982 Long term (current) use of aspirin: Secondary | ICD-10-CM | POA: Insufficient documentation

## 2017-06-22 DIAGNOSIS — K760 Fatty (change of) liver, not elsewhere classified: Secondary | ICD-10-CM | POA: Insufficient documentation

## 2017-06-22 DIAGNOSIS — E669 Obesity, unspecified: Secondary | ICD-10-CM | POA: Insufficient documentation

## 2017-06-22 LAB — POCT GLYCOSYLATED HEMOGLOBIN (HGB A1C): HBA1C, POC (CONTROLLED DIABETIC RANGE): 8.3 % — AB (ref 0.0–7.0)

## 2017-06-22 LAB — GLUCOSE, POCT (MANUAL RESULT ENTRY): POC Glucose: 282 mg/dl — AB (ref 70–99)

## 2017-06-22 MED ORDER — METFORMIN HCL 500 MG PO TABS: 1000.0000 mg | ORAL_TABLET | Freq: Two times a day (BID) | ORAL | 11 refills | Status: DC

## 2017-06-22 MED FILL — ?METFORMIN HCL 500MG TABLET: 500 | 30 days supply | Qty: 120 | Fill #0

## 2017-06-22 NOTE — Progress Notes (Signed)
Patient ID: [name] [name], female    DOB: 1960-10-16  MRN: [id number]  CC: Diabetes   Subjective: [name] [name] is a 57 y.o. female who presents for chronic ds management Her concerns today include:  Pt with hx of DM type 2, obesity, NAFLD, HL, GERD  DM:  Checks BS about 2 x a wk.  Gives range in the 200 A1C up 1 point since last visit.  Med: Metformin inc to 1 gram in a.m and 500 mg p.m on last visit. She misses evening dose about 2 x a wk Eating habits:  Doing okay Exercise:  Walking 10 mins 3 x a wk Eye: reports having had eye exam about 1.5 mth ago at [facility].  No retinopathy  HL:  Reports compliance with Pravachol.  Hardly ever miss a dose.  Last lipid profile was not at goal. Patient Active Problem List   Diagnosis Date Noted  . Obesity (BMI 30-39.9) 09/29/2016  . NAFL (nonalcoholic fatty liver) 78/58/8502  . Irritant contact dermatitis due to other chemical products 05/20/2016  . Diabetes type 2, controlled ([place]) 10/21/2014  . Hyperlipidemia 11/04/2013  . Environmental allergies 07/12/2013  . Bell's palsy 11/06/2012  . GERD 11/04/2005     Current Outpatient Medications on File Prior to Visit  Medication Sig Dispense Refill  . aspirin 325 MG tablet Take 1 tablet (325 mg total) by mouth daily. 30 tablet 0  . Blood Glucose Monitoring Suppl (TRUE METRIX METER) w/Device KIT Use as directed 1 kit 0  . cetirizine (ZYRTEC) 10 MG tablet Take 1 tablet (10 mg total) by mouth 2 (two) times daily. 60 tablet 5  . fluocinonide ointment (LIDEX) 7.74 % Apply 1 application topically 2 (two) times daily. To palms 60 g 0  . glucose blood (TRUE METRIX BLOOD GLUCOSE TEST) test strip Use as instructed 100 each 12  . ketotifen (ZADITOR) 0.025 % ophthalmic solution Place 1 drop into the right eye 2 (two) times daily. 10 mL 1  . omeprazole (PRILOSEC) 20 MG capsule Take 1 capsule (20 mg total) by mouth daily. 30 capsule 5  . pravastatin (PRAVACHOL) 40 MG tablet Take 1  tablet (40 mg total) by mouth daily. 30 tablet 11  . TRUEPLUS LANCETS 28G MISC Use as directed 100 each 1   No current facility-administered medications on file prior to visit.     No Known Allergies  Social History   Socioeconomic History  . Marital status: Married    Spouse name: Not on file  . Number of children: Not on file  . Years of education: Not on file  . Highest education level: Not on file  Occupational History  . Not on file  Social Needs  . Financial resource strain: Not on file  . Food insecurity:    Worry: Not on file    Inability: Not on file  . Transportation needs:    Medical: Not on file    Non-medical: Not on file  Tobacco Use  . Smoking status: Never Smoker  . Smokeless tobacco: Never Used  Substance and Sexual Activity  . Alcohol use: No  . Drug use: No  . Sexual activity: Not on file  Lifestyle  . Physical activity:    Days per week: Not on file    Minutes per session: Not on file  . Stress: Not on file  Relationships  . Social connections:    Talks on phone: Not on file    Gets together: Not on file  Attends religious service: Not on file    Active member of club or organization: Not on file    Attends meetings of clubs or organizations: Not on file    Relationship status: Not on file  . Intimate partner violence:    Fear of current or ex partner: Not on file    Emotionally abused: Not on file    Physically abused: Not on file    Forced sexual activity: Not on file  Other Topics Concern  . Not on file  Social History Narrative  . Not on file    Family History  Problem Relation Age of Onset  . Hypertension Mother   . Diabetes Mother   . Diabetes Father   . Colon cancer Neg Hx     Past Surgical History:  Procedure Laterality Date  . TUBAL LIGATION  1995    ROS: Review of Systems Neg except as stated above.  PHYSICAL EXAM: BP 115/69   Pulse 74   Temp 98.3 F (36.8 C) (Oral)   Resp 16   Wt 203 lb 9.6 oz (92.4 kg)    SpO2 97%   BMI 37.24 kg/m   Wt Readings from Last 3 Encounters:  06/22/17 203 lb 9.6 oz (92.4 kg)  03/30/17 203 lb 6.4 oz (92.3 kg)  12/29/16 200 lb (90.7 kg)    Physical Exam  General appearance - alert, well appearing, obese middle and in no distress Mental status - normal mood, behavior, speech, dress, motor activity, and thought processes Mouth - mucous membranes moist, pharynx normal without lesions Neck - supple, no significant adenopathy Chest - clear to auscultation, no wheezes, rales or rhonchi, symmetric air entry Heart - normal rate, regular rhythm, normal S1, S2, no murmurs, rubs, clicks or gallops Extremities - peripheral pulses normal, no pedal edema, no clubbing or cyanosis  Lab Results  Component Value Date   CHOL 228 (H) 03/30/2017   HDL 47 03/30/2017   LDLCALC 109 (H) 03/30/2017   TRIG 361 (H) 03/30/2017   CHOLHDL 4.9 (H) 03/30/2017    BS 282/A1C 8.3  ASSESSMENT AND PLAN: 1. Diabetes mellitus type 2, uncontrolled, without complications (HCC) Not at goal.  Inc Metformin to 1 gram BID Pt set goal to increase exercise to 15 mins 3 x a wk.  Encourage her to eventually get up to 30 mins - POCT glucose (manual entry) - POCT glycosylated hemoglobin (Hb A1C) - metFORMIN (GLUCOPHAGE) 500 MG tablet; Take 2 tablets (1,000 mg total) by mouth 2 (two) times daily with a meal. 2 tabs PO Q a.m and 1 Q p.m  Dispense: 120 tablet; Refill: 11  2. Hyperlipidemia, unspecified hyperlipidemia type Given the elev TG also seen on last lipid panel, I requested that she come fasting to lab next wk for recheck. - Lipid panel; Future  3. Obesity (BMI 30-39.9) -discussed importance of continue healthy eating habits: smaller portion sizes, avoid sugary foods Pt set goal to increase exercise to 15 mins 3 x a wk.  Encourage her to eventually get up to 30 mins Patient was given the opportunity to ask questions.  Patient verbalized understanding of the plan and was able to repeat key  elements of the plan.  [employer] interpreter used during this encounter.  Orders Placed This Encounter  Procedures  . Lipid panel  . POCT glucose (manual entry)  . POCT glycosylated hemoglobin (Hb A1C)     Requested Prescriptions   Signed Prescriptions Disp Refills  . metFORMIN (GLUCOPHAGE) 500 MG tablet 120  tablet 11    Sig: Take 2 tablets (1,000 mg total) by mouth 2 (two) times daily with a meal. 2 tabs PO Q a.m and 1 Q p.m    Return in about 3 months (around 09/22/2017).  Karle Plumber, MD, FACP

## 2017-06-22 NOTE — Addendum Note (Signed)
Addended by: Jonah BlueJOHNSON, DEBORAH B on: 06/22/2017 09:12 AM   Modules accepted: Orders

## 2017-06-22 NOTE — Patient Instructions (Signed)
Diabetes mellitus y actividad fsica  (Diabetes Mellitus and Exercise)  Hacer actividad fsica habitualmente es importante para el estado de salud general, en especial si tiene diabetes (diabetes mellitus). La actividad fsica no solo se reduce a bajar de peso. Aporta muchos beneficios para la salud, como aumentar la fuerza muscular y la densidad sea, y reducir las grasas corporales y el estrs. Esto mejora el estado fsico, la flexibilidad y la resistencia, y todo ello redunda en un mejor estado de salud general.  La actividad fsica tiene beneficios adicionales para los diabticos, entre ellos:   Disminuye el apetito.   Ayuda a bajar y mantener la glucemia bajo control.   Baja la presin arterial.   Ayuda a controlar las cantidades de sustancias grasas (lpidos) en la sangre, como el colesterol y los triglicridos.   Mejora la respuesta del cuerpo a la insulina (optimizacin de la sensibilidad a la insulina).   Reduce la cantidad de insulina que el cuerpo necesita.   Reduce el riesgo de sufrir cardiopata coronaria de la siguiente forma:  ? Baja los niveles de colesterol y triglicridos.  ? Aumenta los niveles de colesterol bueno.  ? Disminuye la glucemia.  QU ES EL PLAN DE ACTIVIDADES?  El mdico o un educador para la diabetes certificado pueden ayudarlo a elaborar un plan respecto del tipo y de la frecuencia de actividad fsica (plan de actividades) adecuado para usted. Asegrese de lo siguiente:   Haga por lo menos 150minutos semanales de ejercicios de intensidad moderada o vigorosa. Estos podran ser caminatas dinmicas, ciclismo o gimnasia acutica.  ? Haga ejercicios de elongacin y de fortalecimiento, como yoga o levantamiento de pesas, por lo menos 2veces por semana.  ? Reparta la actividad en al menos 3das de la semana.   Haga algn tipo de actividad fsica todos los das.  ? No deje pasar ms de 2das seguidos sin hacer algn tipo de actividad fsica.  ? No permanezca inactivo durante  ms de 90minutos seguidos. Tmese descansos frecuentes para caminar o estirarse.   Elija un tipo de ejercicio o de actividad que disfrute y establezca objetivos realistas.   Comience lentamente y aumente de manera gradual la intensidad del ejercicio con el correr del tiempo.  QU DEBO SABER ACERCA DEL CONTROL DE LA DIABETES?   Contrlese la glucemia antes y despus de ejercitarse.  ? Si la glucemia supera los 240mg/dl (13,3mmol/l) antes de ejercitarse, hgase un control de la orina para detectar la presencia de cetonas. Si tiene cetonas en la orina, no se ejercite hasta que la glucemia se normalice.   Conozca los sntomas de la glucemia baja (hipoglucemia) y aprenda cmo tratarla. El riesgo de tener hipoglucemia aumenta durante y despus de hacer actividad fsica. Los sntomas frecuentes de hipoglucemia pueden incluir los siguientes:  ? Hambre.  ? Ansiedad.  ? Sudoracin y piel hmeda.  ? Confusin.  ? Mareos o sensacin de desvanecimiento.  ? Aumento de la frecuencia cardaca o palpitaciones.  ? Visin borrosa.  ? Hormigueo o adormecimiento alrededor de la boca, los labios o la lengua.  ? Temblores o sacudidas.  ? Irritabilidad.   Tenga una colacin de carbohidratos de accin rpida disponible antes, durante y despus de ejercitarse, a fin de evitar o tratar la hipoglucemia.   Evite inyectarse insulina en las zonas del cuerpo que ejercitar. Por ejemplo, evite inyectarse insulina en:  ? Los brazos, si juega al tenis.  ? Las piernas, si corre.   Lleve registros de sus hbitos de actividad fsica.   Esto puede ayudarlos a usted y al mdico a adaptar el plan de control de la diabetes segn sea necesario. Escriba los siguientes datos:  ? Los alimentos que consume antes y despus de hacer actividad fsica.  ? Los niveles de glucosa en la sangre antes y despus de hacer ejercicios.  ? El tipo y cantidad de actividad fsica que realiza.  ? Cuando se prev que la insulina alcance su valor mximo, si usa insulina.  No haga actividad fsica en los momentos en que insulina alcanza su valor mximo.   Cuando comience un ejercicio o una actividad nuevos, trabaje con el mdico para asegurarse de que la actividad sea segura para usted y para ajustar la insulina, los medicamentos o la ingesta de alimentos segn sea necesario.   Beba gran cantidad de agua mientras hace ejercicios para evitar la deshidratacin o los golpes de calor. Beba suficiente lquido para mantener la orina clara o de color amarillo plido.  Esta informacin no tiene como fin reemplazar el consejo del mdico. Asegrese de hacerle al mdico cualquier pregunta que tenga.  Document Released: 01/23/2007 Document Revised: 04/27/2015 Document Reviewed: 06/15/2015  Elsevier Interactive Patient Education  2018 Elsevier Inc.

## 2017-06-28 ENCOUNTER — Ambulatory Visit: Payer: Self-pay | Attending: Internal Medicine

## 2017-06-28 DIAGNOSIS — E785 Hyperlipidemia, unspecified: Secondary | ICD-10-CM | POA: Insufficient documentation

## 2017-06-28 MED FILL — PRAVASTATIN NA 40 MG TAB: 40 | 30 days supply | Qty: 30 | Fill #3

## 2017-06-28 NOTE — Progress Notes (Signed)
Patient here for lab visit  

## 2017-06-29 LAB — LIPID PANEL
CHOLESTEROL TOTAL: 190 mg/dL (ref 100–199)
Chol/HDL Ratio: 3.7 ratio (ref 0.0–4.4)
HDL: 51 mg/dL (ref >39)
LDL Calculated: 102 mg/dL — ABNORMAL HIGH (ref 0–99)
Triglycerides: 187 mg/dL — ABNORMAL HIGH (ref 0–149)
VLDL Cholesterol Cal: 37 mg/dL (ref 5–40)

## 2017-07-12 ENCOUNTER — Telehealth: Payer: Self-pay

## 2017-07-12 NOTE — Telephone Encounter (Signed)
[facility] Id# [phone] contacted pt to go over lab results pt didn't answer left a detailed vm informing her of the results and if she has any questions or ocncerns to give me a call  If pt calls back please give results: cholesterol level has improved. Continue the Pravachol.

## 2017-07-28 MED FILL — PRAVASTATIN NA 40 MG TAB: 40 | 30 days supply | Qty: 30 | Fill #4

## 2017-08-11 ENCOUNTER — Ambulatory Visit: Payer: No Typology Code available for payment source | Attending: Internal Medicine

## 2017-08-22 MED FILL — ?METFORMIN HCL 500MG TABS: 500 | 30 days supply | Qty: 120 | Fill #1

## 2017-08-22 MED FILL — ?PRAVASTATIN NA 40 MG TAB: 40 | 30 days supply | Qty: 30 | Fill #5

## 2017-09-26 ENCOUNTER — Ambulatory Visit: Payer: Self-pay | Attending: Internal Medicine | Admitting: Internal Medicine

## 2017-09-26 ENCOUNTER — Encounter: Payer: Self-pay | Admitting: Internal Medicine

## 2017-09-26 VITALS — BP 119/76 | HR 76 | Temp 98.6°F | Resp 16 | Wt 200.0 lb

## 2017-09-26 DIAGNOSIS — Z800 Family history of malignant neoplasm of digestive organs: Secondary | ICD-10-CM | POA: Insufficient documentation

## 2017-09-26 DIAGNOSIS — Z23 Encounter for immunization: Secondary | ICD-10-CM

## 2017-09-26 DIAGNOSIS — Z6836 Body mass index (BMI) 36.0-36.9, adult: Secondary | ICD-10-CM | POA: Insufficient documentation

## 2017-09-26 DIAGNOSIS — Z7984 Long term (current) use of oral hypoglycemic drugs: Secondary | ICD-10-CM | POA: Insufficient documentation

## 2017-09-26 DIAGNOSIS — E119 Type 2 diabetes mellitus without complications: Secondary | ICD-10-CM

## 2017-09-26 DIAGNOSIS — Z7982 Long term (current) use of aspirin: Secondary | ICD-10-CM | POA: Insufficient documentation

## 2017-09-26 DIAGNOSIS — Z833 Family history of diabetes mellitus: Secondary | ICD-10-CM | POA: Insufficient documentation

## 2017-09-26 DIAGNOSIS — E785 Hyperlipidemia, unspecified: Secondary | ICD-10-CM

## 2017-09-26 DIAGNOSIS — Z79899 Other long term (current) drug therapy: Secondary | ICD-10-CM | POA: Insufficient documentation

## 2017-09-26 DIAGNOSIS — Z8249 Family history of ischemic heart disease and other diseases of the circulatory system: Secondary | ICD-10-CM | POA: Insufficient documentation

## 2017-09-26 DIAGNOSIS — K219 Gastro-esophageal reflux disease without esophagitis: Secondary | ICD-10-CM | POA: Insufficient documentation

## 2017-09-26 DIAGNOSIS — E669 Obesity, unspecified: Secondary | ICD-10-CM

## 2017-09-26 DIAGNOSIS — E1165 Type 2 diabetes mellitus with hyperglycemia: Secondary | ICD-10-CM | POA: Insufficient documentation

## 2017-09-26 LAB — POCT GLYCOSYLATED HEMOGLOBIN (HGB A1C): HBA1C, POC (CONTROLLED DIABETIC RANGE): 7.2 % — AB (ref 0.0–7.0)

## 2017-09-26 LAB — GLUCOSE, POCT (MANUAL RESULT ENTRY): POC Glucose: 148 mg/dl — AB (ref 70–99)

## 2017-09-26 MED ORDER — GLUCOSE BLOOD VI STRP: ORAL_STRIP | 12 refills | Status: DC

## 2017-09-26 MED FILL — TRUE METRIX TEST STRIP: 25 days supply | Qty: 100 | Fill #0

## 2017-09-26 MED FILL — PRAVASTATIN NA 40 MG TAB: 40 | 30 days supply | Qty: 30 | Fill #6

## 2017-09-26 MED FILL — OMEPRAZOLE 20 MG CAP: 20 | 30 days supply | Qty: 30 | Fill #3

## 2017-09-26 NOTE — Patient Instructions (Signed)

## 2017-09-26 NOTE — Progress Notes (Signed)
Patient ID: [name] [name], female    DOB: 19-Oct-1960  MRN: [id number]  CC: Diabetes   Subjective: [name] [name] is a 57 y.o. female who presents for chronic ds management Her concerns today include:  Pt with hx of DM type 2, obesity, NAFLD, HL, GERD  DM/NAFLD: checking BS Q 2 days but not recently.  Needs more stripes.  Readings on glucometer that she has with her are in the mid 100 range.  Compliant with Metformin 1 gram BID Eating habits:  Doing better with eating habits.   Walks daily for 15 mins.  HL:  Compliant with Pravachol  Patient Active Problem List   Diagnosis Date Noted  . Obesity (BMI 30-39.9) 09/29/2016  . NAFL (nonalcoholic fatty liver) 94/50/3888  . Irritant contact dermatitis due to other chemical products 05/20/2016  . Diabetes type 2, controlled ([place]) 10/21/2014  . Hyperlipidemia 11/04/2013  . Environmental allergies 07/12/2013  . Bell's palsy 11/06/2012  . GERD 11/04/2005     Current Outpatient Medications on File Prior to Visit  Medication Sig Dispense Refill  . aspirin 325 MG tablet Take 1 tablet (325 mg total) by mouth daily. 30 tablet 0  . Blood Glucose Monitoring Suppl (TRUE METRIX METER) w/Device KIT Use as directed 1 kit 0  . cetirizine (ZYRTEC) 10 MG tablet Take 1 tablet (10 mg total) by mouth 2 (two) times daily. 60 tablet 5  . fluocinonide ointment (LIDEX) 2.80 % Apply 1 application topically 2 (two) times daily. To palms 60 g 0  . ketotifen (ZADITOR) 0.025 % ophthalmic solution Place 1 drop into the right eye 2 (two) times daily. 10 mL 1  . metFORMIN (GLUCOPHAGE) 500 MG tablet Take 2 tablets (1,000 mg total) by mouth 2 (two) times daily with a meal. 120 tablet 11  . omeprazole (PRILOSEC) 20 MG capsule Take 1 capsule (20 mg total) by mouth daily. 30 capsule 5  . pravastatin (PRAVACHOL) 40 MG tablet Take 1 tablet (40 mg total) by mouth daily. 30 tablet 11  . TRUEPLUS LANCETS 28G MISC Use as directed 100 each 1   No  current facility-administered medications on file prior to visit.     No Known Allergies  Social History   Socioeconomic History  . Marital status: Married    Spouse name: Not on file  . Number of children: Not on file  . Years of education: Not on file  . Highest education level: Not on file  Occupational History  . Not on file  Social Needs  . Financial resource strain: Not on file  . Food insecurity:    Worry: Not on file    Inability: Not on file  . Transportation needs:    Medical: Not on file    Non-medical: Not on file  Tobacco Use  . Smoking status: Never Smoker  . Smokeless tobacco: Never Used  Substance and Sexual Activity  . Alcohol use: No  . Drug use: No  . Sexual activity: Not on file  Lifestyle  . Physical activity:    Days per week: Not on file    Minutes per session: Not on file  . Stress: Not on file  Relationships  . Social connections:    Talks on phone: Not on file    Gets together: Not on file    Attends religious service: Not on file    Active member of club or organization: Not on file    Attends meetings of clubs or organizations: Not on file  Relationship status: Not on file  . Intimate partner violence:    Fear of current or ex partner: Not on file    Emotionally abused: Not on file    Physically abused: Not on file    Forced sexual activity: Not on file  Other Topics Concern  . Not on file  Social History Narrative  . Not on file    Family History  Problem Relation Age of Onset  . Hypertension Mother   . Diabetes Mother   . Diabetes Father   . Colon cancer Neg Hx     Past Surgical History:  Procedure Laterality Date  . TUBAL LIGATION  1995    ROS: Review of Systems Neg except as above. PHYSICAL EXAM: BP 119/76   Pulse 76   Temp 98.6 F (37 C) (Oral)   Resp 16   Wt 200 lb (90.7 kg)   SpO2 97%   BMI 36.58 kg/m   Physical Exam  General appearance - alert, well appearing, and in no distress Mental status -  normal mood, behavior, speech, dress, motor activity, and thought processes Mouth - mucous membranes moist, pharynx normal without lesions Neck - supple, no significant adenopathy Chest - clear to auscultation, no wheezes, rales or rhonchi, symmetric air entry Heart - normal rate, regular rhythm, normal S1, S2, no murmurs, rubs, clicks or gallops Extremities - peripheral pulses normal, no pedal edema, no clubbing or cyanosis Diabetic Foot Exam - Simple   Simple Foot Form Visual Inspection No deformities, no ulcerations, no other skin breakdown bilaterally:  Yes Sensation Testing Pulse Check Posterior Tibialis and Dorsalis pulse intact bilaterally:  Yes Comments      Results for orders placed or performed in visit on 09/26/17  POCT glucose (manual entry)  Result Value Ref Range   POC Glucose 148 (A) 70 - 99 mg/dl  POCT glycosylated hemoglobin (Hb A1C)  Result Value Ref Range   Hemoglobin A1C     HbA1c POC (<> result, manual entry)     HbA1c, POC (prediabetic range)     HbA1c, POC (controlled diabetic range) 7.2 (A) 0.0 - 7.0 %   Lab Results  Component Value Date   CHOL 190 06/28/2017   HDL 51 06/28/2017   LDLCALC 102 (H) 06/28/2017   TRIG 187 (H) 06/28/2017   CHOLHDL 3.7 06/28/2017    ASSESSMENT AND PLAN: 1. Uncontrolled type 2 diabetes mellitus without complication, without long-term current use of insulin ([place]) Close to goal.  We agreed to make no changes with metformin.  She will increase exercise to 30 minutes at least 4 times a week.  Encouraged her to continue healthy eating habits. - POCT glucose (manual entry) - POCT glycosylated hemoglobin (Hb A1C) - glucose blood (TRUE METRIX BLOOD GLUCOSE TEST) test strip; Use as instructed  Dispense: 100 each; Refill: 12  2. Need for influenza vaccination Given today  3. Hyperlipidemia, unspecified hyperlipidemia type Continue pravastatin  4. Obesity (BMI 30-39.9) See #1 above  Patient was given the opportunity to ask  questions.  Patient verbalized understanding of the plan and was able to repeat key elements of the plan.  [employer] interpreter used during this encounter.    Requested Prescriptions   Signed Prescriptions Disp Refills  . glucose blood (TRUE METRIX BLOOD GLUCOSE TEST) test strip 100 each 12    Sig: Use as instructed    Return in about 3 months (around 12/26/2017).  Karle Plumber, MD, FACP

## 2017-10-06 MED FILL — ?METFORMIN HCL 500MG TABS: 500 | 30 days supply | Qty: 120 | Fill #2

## 2017-10-30 MED FILL — PRAVASTATIN NA 40 MG TAB: 40 | 30 days supply | Qty: 30 | Fill #7

## 2017-11-30 MED FILL — ?METFORMIN HCL 500MG TABS: 500 | 30 days supply | Qty: 120 | Fill #3

## 2017-11-30 MED FILL — ?PRAVASTATIN NA 40 MG TAB: 40 | 30 days supply | Qty: 30 | Fill #8

## 2017-12-26 ENCOUNTER — Encounter: Payer: Self-pay | Admitting: Internal Medicine

## 2017-12-26 ENCOUNTER — Ambulatory Visit: Payer: Self-pay | Attending: Internal Medicine | Admitting: Internal Medicine

## 2017-12-26 VITALS — BP 129/91 | HR 67 | Temp 98.7°F | Resp 16 | Wt 199.6 lb

## 2017-12-26 DIAGNOSIS — Z833 Family history of diabetes mellitus: Secondary | ICD-10-CM | POA: Insufficient documentation

## 2017-12-26 DIAGNOSIS — E785 Hyperlipidemia, unspecified: Secondary | ICD-10-CM

## 2017-12-26 DIAGNOSIS — K219 Gastro-esophageal reflux disease without esophagitis: Secondary | ICD-10-CM | POA: Insufficient documentation

## 2017-12-26 DIAGNOSIS — Z8249 Family history of ischemic heart disease and other diseases of the circulatory system: Secondary | ICD-10-CM | POA: Insufficient documentation

## 2017-12-26 DIAGNOSIS — E669 Obesity, unspecified: Secondary | ICD-10-CM | POA: Insufficient documentation

## 2017-12-26 DIAGNOSIS — E119 Type 2 diabetes mellitus without complications: Secondary | ICD-10-CM | POA: Insufficient documentation

## 2017-12-26 DIAGNOSIS — Z7984 Long term (current) use of oral hypoglycemic drugs: Secondary | ICD-10-CM | POA: Insufficient documentation

## 2017-12-26 DIAGNOSIS — Z7982 Long term (current) use of aspirin: Secondary | ICD-10-CM | POA: Insufficient documentation

## 2017-12-26 DIAGNOSIS — Z79899 Other long term (current) drug therapy: Secondary | ICD-10-CM | POA: Insufficient documentation

## 2017-12-26 DIAGNOSIS — IMO0001 Reserved for inherently not codable concepts without codable children: Secondary | ICD-10-CM

## 2017-12-26 DIAGNOSIS — E1165 Type 2 diabetes mellitus with hyperglycemia: Secondary | ICD-10-CM

## 2017-12-26 DIAGNOSIS — Z6836 Body mass index (BMI) 36.0-36.9, adult: Secondary | ICD-10-CM | POA: Insufficient documentation

## 2017-12-26 LAB — POCT GLYCOSYLATED HEMOGLOBIN (HGB A1C): HbA1c, POC (controlled diabetic range): 7.4 % — AB (ref 0.0–7.0)

## 2017-12-26 LAB — GLUCOSE, POCT (MANUAL RESULT ENTRY): POC GLUCOSE: 167 mg/dL — AB (ref 70–99)

## 2017-12-26 MED ORDER — SITAGLIPTIN PHOSPHATE 50 MG PO TABS: 50.0000 mg | ORAL_TABLET | Freq: Every day | ORAL | 99 refills | Status: DC

## 2017-12-26 MED FILL — !JANUVIA 50 MG TABLET: 50 | 30 days supply | Qty: 30 | Fill #0

## 2017-12-26 NOTE — Progress Notes (Signed)
[id number]

## 2017-12-26 NOTE — Progress Notes (Signed)
Patient ID: [name] [name], female    DOB: 1960-06-29  MRN: [id number]  CC: Diabetes   Subjective: [name] [name] is a 57 y.o. female who presents for chronic disease management. Husband is with her.  Her concerns today include:  Pt with hx of DM type 2, obesity, NAFLD, HL, GERD  DIABETES TYPE 2 Last A1C:   Results for orders placed or performed in visit on 12/26/17  POCT glucose (manual entry)  Result Value Ref Range   POC Glucose 167 (A) 70 - 99 mg/dl  POCT glycosylated hemoglobin (Hb A1C)  Result Value Ref Range   Hemoglobin A1C     HbA1c POC (<> result, manual entry)     HbA1c, POC (prediabetic range)     HbA1c, POC (controlled diabetic range) 7.4 (A) 0.0 - 7.0 %    Med Adherence:  _0  Yes    _1  No Medication side effects:  _2  Yes    _3  No Home Monitoring?  _4  Yes, checks BS 2 x a wk, sometimes in mornings and sometimes in evenings     _5  No Home glucose results range: Has glucometer.  Range 112-160 Diet Adherence: _6  Yes, avoids sugars and eating less. However, admits to drinking a lot of juices   _7  No Exercise: _8  Yes, walks daily for 15-20 mins    _9  No Hypoglycemic episodes?: _10  Yes    _11  No Numbness of the feet? _12  Yes    _13  No Retinopathy hx? _14  Yes    _15  No Last eye exam:  Comments:   HL:  Tolerating Pravachol.    Patient Active Problem List   Diagnosis Date Noted  . Obesity (BMI 30-39.9) 09/29/2016  . NAFL (nonalcoholic fatty liver) 78/29/5621  . Irritant contact dermatitis due to other chemical products 05/20/2016  . Diabetes type 2, controlled ([place]) 10/21/2014  . Hyperlipidemia 11/04/2013  . Environmental allergies 07/12/2013  . Bell's palsy 11/06/2012  . GERD 11/04/2005     Current Outpatient Medications on File Prior to Visit  Medication Sig Dispense Refill  . aspirin 325 MG tablet Take 1 tablet (325 mg total) by mouth daily. 30 tablet 0  . Blood Glucose Monitoring Suppl (TRUE METRIX METER) w/Device KIT Use as  directed 1 kit 0  . cetirizine (ZYRTEC) 10 MG tablet Take 1 tablet (10 mg total) by mouth 2 (two) times daily. 60 tablet 5  . fluocinonide ointment (LIDEX) 3.08 % Apply 1 application topically 2 (two) times daily. To palms 60 g 0  . glucose blood (TRUE METRIX BLOOD GLUCOSE TEST) test strip Use as instructed 100 each 12  . ketotifen (ZADITOR) 0.025 % ophthalmic solution Place 1 drop into the right eye 2 (two) times daily. 10 mL 1  . metFORMIN (GLUCOPHAGE) 500 MG tablet Take 2 tablets (1,000 mg total) by mouth 2 (two) times daily with a meal. 120 tablet 11  . omeprazole (PRILOSEC) 20 MG capsule Take 1 capsule (20 mg total) by mouth daily. 30 capsule 5  . pravastatin (PRAVACHOL) 40 MG tablet Take 1 tablet (40 mg total) by mouth daily. 30 tablet 11  . TRUEPLUS LANCETS 28G MISC Use as directed 100 each 1   No current facility-administered medications on file prior to visit.     No Known Allergies  Social History   Socioeconomic History  . Marital status: Married    Spouse name: Not on file  . Number of children: Not on file  . Years of education: Not on file  . Highest  education level: Not on file  Occupational History  . Not on file  Social Needs  . Financial resource strain: Not on file  . Food insecurity:    Worry: Not on file    Inability: Not on file  . Transportation needs:    Medical: Not on file    Non-medical: Not on file  Tobacco Use  . Smoking status: Never Smoker  . Smokeless tobacco: Never Used  Substance and Sexual Activity  . Alcohol use: No  . Drug use: No  . Sexual activity: Not on file  Lifestyle  . Physical activity:    Days per week: Not on file    Minutes per session: Not on file  . Stress: Not on file  Relationships  . Social connections:    Talks on phone: Not on file    Gets together: Not on file    Attends religious service: Not on file    Active member of club or organization: Not on file    Attends meetings of clubs or organizations: Not on  file    Relationship status: Not on file  . Intimate partner violence:    Fear of current or ex partner: Not on file    Emotionally abused: Not on file    Physically abused: Not on file    Forced sexual activity: Not on file  Other Topics Concern  . Not on file  Social History Narrative  . Not on file    Family History  Problem Relation Age of Onset  . Hypertension Mother   . Diabetes Mother   . Diabetes Father   . Colon cancer Neg Hx     Past Surgical History:  Procedure Laterality Date  . TUBAL LIGATION  1995    ROS: Review of Systems Negative except as above PHYSICAL EXAM: BP (!) 129/91   Pulse 67   Temp 98.7 F (37.1 C) (Oral)   Resp 16   Wt 199 lb 9.6 oz (90.5 kg)   SpO2 96%   BMI 36.51 kg/m   Wt Readings from Last 3 Encounters:  12/26/17 199 lb 9.6 oz (90.5 kg)  09/26/17 200 lb (90.7 kg)  06/22/17 203 lb 9.6 oz (92.4 kg)    Physical Exam  General appearance - alert, well appearing, and in no distress Mental status - normal mood, behavior, speech, dress, motor activity, and thought processes Neck - supple, no significant adenopathy Chest - clear to auscultation, no wheezes, rales or rhonchi, symmetric air entry Heart - normal rate, regular rhythm, normal S1, S2, no murmurs, rubs, clicks or gallops Extremities - peripheral pulses normal, no pedal edema, no clubbing or cyanosis Diabetic Foot Exam - Simple   Simple Foot Form Visual Inspection No deformities, no ulcerations, no other skin breakdown bilaterally:  Yes Sensation Testing Intact to touch and monofilament testing bilaterally:  Yes Pulse Check Posterior Tibialis and Dorsalis pulse intact bilaterally:  Yes Comments     ASSESSMENT AND PLAN: 1. Diabetes mellitus type 2, uncontrolled, without complications ([place]) Not at goal. Dietary counseling discussed.  Advised that she discontinue drinking the juices.  Try to drink more water. Continue regular exercise as she has been doing. Continue  metformin.  Add Januvia. - POCT glucose (manual entry) - POCT glycosylated hemoglobin (Hb A1C) - sitaGLIPtin (JANUVIA) 50 MG tablet; Take 1 tablet (50 mg total) by mouth daily.  Dispense: 90 tablet; Refill: PRN  2. Hyperlipidemia, unspecified hyperlipidemia type Continue Pravachol  Patient was given the opportunity to ask  questions.  Patient verbalized understanding of the plan and was able to repeat key elements of the plan.  [employer] interpreter used during this encounter.#[id number].   Requested Prescriptions   Signed Prescriptions Disp Refills  . sitaGLIPtin (JANUVIA) 50 MG tablet 90 tablet PRN    Sig: Take 1 tablet (50 mg total) by mouth daily.    Return in about 3 months (around 03/27/2018).  Karle Plumber, MD, FACP

## 2018-01-01 MED FILL — ?PRAVASTATIN NA 40 MG TAB: 40 | 30 days supply | Qty: 30 | Fill #9

## 2018-01-29 MED FILL — ?METFORMIN HCL 500MG TABLET: 500 | 30 days supply | Qty: 120 | Fill #4

## 2018-01-29 MED FILL — ?PRAVASTATIN NA 40 MG TAB: 40 | 30 days supply | Qty: 30 | Fill #10

## 2018-01-29 MED FILL — !JANUVIA 50 MG TABLET: 50 | 30 days supply | Qty: 30 | Fill #1

## 2018-03-07 MED FILL — ?PRAVASTATIN NA 40 MG TAB: 40 | 30 days supply | Qty: 30 | Fill #11

## 2018-03-29 ENCOUNTER — Ambulatory Visit: Payer: Self-pay

## 2018-03-29 ENCOUNTER — Other Ambulatory Visit: Payer: Self-pay

## 2018-03-29 MED FILL — metFORMIN HCL 500 MG TABS: 500 | 30 days supply | Qty: 120 | Fill #5

## 2018-03-29 MED FILL — $JANUVIA 50 MG TABLET: 50 | 90 days supply | Qty: 90 | Fill #2

## 2018-03-30 ENCOUNTER — Encounter: Payer: Self-pay | Admitting: Internal Medicine

## 2018-03-30 ENCOUNTER — Ambulatory Visit: Payer: Self-pay | Attending: Internal Medicine | Admitting: Internal Medicine

## 2018-03-30 VITALS — BP 120/75 | HR 74 | Temp 98.5°F | Resp 16 | Ht 62.0 in | Wt 201.0 lb

## 2018-03-30 DIAGNOSIS — L600 Ingrowing nail: Secondary | ICD-10-CM

## 2018-03-30 DIAGNOSIS — IMO0001 Reserved for inherently not codable concepts without codable children: Secondary | ICD-10-CM

## 2018-03-30 DIAGNOSIS — E785 Hyperlipidemia, unspecified: Secondary | ICD-10-CM

## 2018-03-30 DIAGNOSIS — E669 Obesity, unspecified: Secondary | ICD-10-CM

## 2018-03-30 DIAGNOSIS — E1165 Type 2 diabetes mellitus with hyperglycemia: Secondary | ICD-10-CM

## 2018-03-30 LAB — POCT GLYCOSYLATED HEMOGLOBIN (HGB A1C): HEMOGLOBIN A1C: 7.2 % — AB (ref 4.0–5.6)

## 2018-03-30 LAB — GLUCOSE, POCT (MANUAL RESULT ENTRY): POC Glucose: 143 mg/dl — AB (ref 70–99)

## 2018-03-30 MED ORDER — CEPHALEXIN 500 MG PO CAPS: 500.0000 mg | ORAL_CAPSULE | Freq: Four times a day (QID) | ORAL | 0 refills | Status: DC

## 2018-03-30 MED ORDER — PRAVASTATIN SODIUM 40 MG PO TABS: 40.0000 mg | ORAL_TABLET | Freq: Every day | ORAL | 11 refills | Status: DC

## 2018-03-30 MED FILL — ?PRAVASTATIN NA 40 MG TAB: 40 | 30 days supply | Qty: 30 | Fill #0

## 2018-03-30 MED FILL — CEPHALEXIN 500 MG CAPSULE: 500 | 7 days supply | Qty: 28 | Fill #0

## 2018-03-30 NOTE — Patient Instructions (Signed)
Ua del pie encarnada  Ingrown Toenail  La ua del pie encarnada se produce cuando las esquinas o los costados de la ua crecen hacia la piel circundante. Esto produce molestias y dolor. Es ms frecuente que afecte el dedo gordo, pero puede ocurrir en cualquier dedo del pie. Si la ua del pie encarnada no se trata, esta puede infectarse.  Cules son las causas?  Esta afeccin puede ser causada por lo siguiente:   Uso de calzado muy pequeo o apretado.   Lesin, por ejemplo, al golpearse el dedo contra algo o si alguien se lo pisa.   Cuidado inadecuado de las uas del pie o uas mal cortadas.   Tener anomalas en la ua o el pie que estaban presentes al nacer (anomalas congnitas), como tener una ua que es demasiado grande para el dedo.  Qu incrementa el riesgo?  Los siguientes factores pueden hacer que usted sea ms propenso a desarrollar uas del pie encarnadas:   La edad. Las uas tienden a hacerse ms gruesas con la edad, de modo que las uas encarnadas son ms comunes entre las personas mayores.   Cortarse las uas de los pies de forma incorrecta, como cortarlas muy cortas o de forma desigual.  Es ms probable que una ua del pie encarnada se infecte si usted tiene:   Diabetes.   Problemas en el flujo sanguneo (circulacin).  Cules son los signos o los sntomas?  Los sntomas de una ua del pie encarnada pueden incluir:   Inflamacin o dolor y sensibilidad con la palpacin.   Enrojecimiento.   Hinchazn.   Endurecimiento de la piel alrededor de la ua del pie.  Los signos de que una ua del pie encarnada puede estar infectada incluyen:   Lquido o pus.   Los sntomas empeoran en vez de mejorar.  Cmo se diagnostica?  Esta afeccin se puede diagnosticar en funcin de los antecedentes mdicos, los sntomas y un examen fsico. Si le sale lquido o sangre de la ua del pie, se puede tomar una muestra para analizarla y determinar el tipo especfico de bacteria que est provocando la  infeccin.  Cmo se trata?  El tratamiento depende de la gravedad de la ua del pie encarnada. Es probable que pueda cuidar la ua del pie en su casa.   Si tiene una infeccin, posiblemente le receten un antibitico.   Si sale lquido o pus de la ua del pie, el mdico puede drenarlo.   Si tiene problemas para caminar, es posible que le indiquen que use muletas.   Si tiene una ua del pie encarnada grave o infectada, es posible que necesite un procedimiento para retirar parte o toda la ua.  Siga estas indicaciones en su casa:  Cuidados de los pies     No se toque la ua del pie ni trate de quitarla por su cuenta.   Sumerja el pie en agua caliente y jabonosa. Hgalo durante 20 minutos, 3veces al da, o con la frecuencia que le haya indicado el mdico. Esto ayuda a mantener la ua limpia y la piel suave.   Use calzado que le quede bien de tamao y que no sea demasiado ajustado. El mdico puede recomendarle que use calzado abierto mientras sana.   Crtese las uas de los pies con cuidado y de forma regular. Crtese las uas de los pies en lnea recta para evitar lesiones a la piel de las esquinas de las uas de los pies. No corte las uas de forma curva.     Mantenga los pies limpios y secos para ayudar a prevenir una infeccin.  Medicamentos   Tome los medicamentos de venta libre y los recetados solamente como se lo haya indicado el mdico.   Si le recetaron un antibitico, tmelo como se lo haya indicado el mdico. No deje de tomar el antibitico aunque comience a sentirse mejor.  Actividad   Reanude sus actividades normales segn lo indicado por el mdico. Pregntele al mdico qu actividades son seguras para usted.   Evite las actividades que causan dolor.  Instrucciones generales   Si el mdico le indica que use muletas para ayudarse a desplazarse, selas como le indiquen.   Concurra a todas las visitas de seguimiento como se lo haya indicado el mdico. Esto es importante.  Comunquese con un  mdico si:   Tiene ms enrojecimiento, hinchazn, dolor u otros sntomas que no mejoran con el tratamiento.   Observa lquido, sangre o pus que sale de la ua del pie.  Solicite ayuda de inmediato si:   Tiene una lnea roja en la piel que comienza en el pie y contina hasta la pierna.   Tiene fiebre.  Resumen   La ua del pie encarnada se produce cuando las esquinas o los costados de la ua crecen hacia la piel circundante. Esto produce molestias y dolor. Es ms frecuente que afecte el dedo gordo, pero puede ocurrir en cualquier dedo del pie.   Si la ua del pie encarnada no se trata, esta puede infectarse.   La presencia de lquido o pus proveniente de la ua del pie es un signo de infeccin. El mdico necesitar drenar el lquido o pus del dedo. Le darn antibiticos para tratar la infeccin.   Cortar las uas de los pies con regularidad y de forma correcta puede ayudar a evitar que se encarnen las uas de los pies.  Esta informacin no tiene como fin reemplazar el consejo del mdico. Asegrese de hacerle al mdico cualquier pregunta que tenga.  Document Released: 01/03/2005 Document Revised: 11/21/2016 Document Reviewed: 11/21/2016  Elsevier Interactive Patient Education  2019 [employer] Inc[employer]

## 2018-03-30 NOTE — Progress Notes (Signed)
Patient ID: [name] [name], female    DOB: [birth year]  MRN: [id number]  CC: Follow-up   Subjective: [name] [name] is a 58 y.o. female who presents for chronic ds management. Her concerns today include:  Pt with hx of DM type 2, obesity, NAFLD, HL, GERD  DIABETES TYPE 2/Obesity Last A1C:   Results for orders placed or performed in visit on 03/30/18  POCT glucose (manual entry)  Result Value Ref Range   POC Glucose 143 (A) 70 - 99 mg/dl  POCT glycosylated hemoglobin (Hb A1C)  Result Value Ref Range   Hemoglobin A1C 7.2 (A) 4.0 - 5.6 %   HbA1c POC (<> result, manual entry)     HbA1c, POC (prediabetic range)     HbA1c, POC (controlled diabetic range)      Med Adherence:  '[x]'$  Yes - Metformin and Januvia    '[]'$  No Medication side effects:  '[]'$  Yes    '[x]'$  No Home Monitoring?  '[x]'$  Yes;  Several times a week Home glucose results range: 120-145 Diet Adherence: '[x]'$  Yes    '[]'$  No Exercise: '[]'$  Yes    '[x]'$  No has not exercise in about 1 mth because she had injured the RT big toe.  Accidentally cut the lateral aspect of nail too short.  Toe was very sore for a while.  Just started to heal Hypoglycemic episodes?: '[]'$  Yes    '[x]'$  No Numbness of the feet? '[]'$  Yes    '[x]'$  No Retinopathy hx? '[]'$  Yes    '[x]'$  No Last eye exam:  Comments:   HL:  Tolerating Pravachol  Patient Active Problem List   Diagnosis Date Noted  . Obesity (BMI 30-39.9) 09/29/2016  . NAFL (nonalcoholic fatty liver) 42/70/6237  . Irritant contact dermatitis due to other chemical products 05/20/2016  . Diabetes type 2, controlled ([place]) 10/21/2014  . Hyperlipidemia 11/04/2013  . Environmental allergies 07/12/2013  . Bell's palsy 11/06/2012  . GERD 11/04/2005     Current Outpatient Medications on File Prior to Visit  Medication Sig Dispense Refill  . aspirin 325 MG tablet Take 1 tablet (325 mg total) by mouth daily. 30 tablet 0  . Blood Glucose Monitoring Suppl (TRUE METRIX METER) w/Device KIT Use as  directed 1 kit 0  . cetirizine (ZYRTEC) 10 MG tablet Take 1 tablet (10 mg total) by mouth 2 (two) times daily. 60 tablet 5  . fluocinonide ointment (LIDEX) 6.28 % Apply 1 application topically 2 (two) times daily. To palms 60 g 0  . glucose blood (TRUE METRIX BLOOD GLUCOSE TEST) test strip Use as instructed 100 each 12  . ketotifen (ZADITOR) 0.025 % ophthalmic solution Place 1 drop into the right eye 2 (two) times daily. 10 mL 1  . metFORMIN (GLUCOPHAGE) 500 MG tablet Take 2 tablets (1,000 mg total) by mouth 2 (two) times daily with a meal. 120 tablet 11  . omeprazole (PRILOSEC) 20 MG capsule Take 1 capsule (20 mg total) by mouth daily. 30 capsule 5  . sitaGLIPtin (JANUVIA) 50 MG tablet Take 1 tablet (50 mg total) by mouth daily. 90 tablet PRN  . TRUEPLUS LANCETS 28G MISC Use as directed 100 each 1   No current facility-administered medications on file prior to visit.     No Known Allergies  Social History   Socioeconomic History  . Marital status: Married    Spouse name: Not on file  . Number of children: Not on file  . Years of education: Not on file  . Highest  education level: Not on file  Occupational History  . Not on file  Social Needs  . Financial resource strain: Not on file  . Food insecurity:    Worry: Not on file    Inability: Not on file  . Transportation needs:    Medical: Not on file    Non-medical: Not on file  Tobacco Use  . Smoking status: Never Smoker  . Smokeless tobacco: Never Used  Substance and Sexual Activity  . Alcohol use: No  . Drug use: No  . Sexual activity: Not on file  Lifestyle  . Physical activity:    Days per week: Not on file    Minutes per session: Not on file  . Stress: Not on file  Relationships  . Social connections:    Talks on phone: Not on file    Gets together: Not on file    Attends religious service: Not on file    Active member of club or organization: Not on file    Attends meetings of clubs or organizations: Not on file     Relationship status: Not on file  . Intimate partner violence:    Fear of current or ex partner: Not on file    Emotionally abused: Not on file    Physically abused: Not on file    Forced sexual activity: Not on file  Other Topics Concern  . Not on file  Social History Narrative  . Not on file    Family History  Problem Relation Age of Onset  . Hypertension Mother   . Diabetes Mother   . Diabetes Father   . Colon cancer Neg Hx     Past Surgical History:  Procedure Laterality Date  . TUBAL LIGATION  1995    ROS: Review of Systems Negative except as stated above  PHYSICAL EXAM: BP 120/75 (BP Location: Left Arm, Patient Position: Sitting, Cuff Size: Normal)   Pulse 74   Temp 98.5 F (36.9 C) (Oral)   Resp 16   Ht '5\' 2"'$  (1.575 m)   Wt 201 lb (91.2 kg)   LMP  (Exact Date)   SpO2 95%   BMI 36.76 kg/m   Wt Readings from Last 3 Encounters:  03/30/18 201 lb (91.2 kg)  12/26/17 199 lb 9.6 oz (90.5 kg)  09/26/17 200 lb (90.7 kg)   Physical Exam General appearance - alert, well appearing, and in no distress Mental status - normal mood, behavior, speech, dress, motor activity, and thought processes Neck - supple, no significant adenopathy Chest - clear to auscultation, no wheezes, rales or rhonchi, symmetric air entry Heart - normal rate, regular rhythm, normal S1, S2, no murmurs, rubs, clicks or gallops Extremities - peripheral pulses normal, no pedal edema, no clubbing or cyanosis Diabetic Foot Exam - Simple   Simple Foot Form Visual Inspection See comments:  Yes Sensation Testing Intact to touch and monofilament testing bilaterally:  Yes Pulse Check Posterior Tibialis and Dorsalis pulse intact bilaterally:  Yes Comments Mild redness, edema and crusting along the lateral edge of the nail bed of RT big toe.  Small amount of fluid expressed.  Nail ingrown.  Toenails on the other toes are cut too short.     Results for orders placed or performed in visit on  03/30/18  POCT glucose (manual entry)  Result Value Ref Range   POC Glucose 143 (A) 70 - 99 mg/dl  POCT glycosylated hemoglobin (Hb A1C)  Result Value Ref Range   Hemoglobin A1C 7.2 (  A) 4.0 - 5.6 %   HbA1c POC (<> result, manual entry)     HbA1c, POC (prediabetic range)     HbA1c, POC (controlled diabetic range)       CMP Latest Ref Rng & Units 03/30/2017 06/16/2016 12/31/2015  Glucose 65 - 99 mg/dL 115(H) 135(H) 118(H)  BUN 6 - 24 mg/dL '8 11 10  '$ Creatinine 0.57 - 1.00 mg/dL 0.58 0.74 0.58  Sodium 134 - 144 mmol/L 141 143 140  Potassium 3.5 - 5.2 mmol/L 4.1 4.6 3.7  Chloride 96 - 106 mmol/L 102 102 102  CO2 20 - 29 mmol/L '26 26 29  '$ Calcium 8.7 - 10.2 mg/dL 9.7 9.8 9.8  Total Protein 6.0 - 8.5 g/dL 7.2 7.5 7.1  Total Bilirubin 0.0 - 1.2 mg/dL 0.6 0.7 0.7  Alkaline Phos 39 - 117 IU/L 98 99 83  AST 0 - 40 IU/L 32 26 25  ALT 0 - 32 IU/L 43(H) 36(H) 33(H)   Lipid Panel     Component Value Date/Time   CHOL 190 06/28/2017 0855   TRIG 187 (H) 06/28/2017 0855   HDL 51 06/28/2017 0855   CHOLHDL 3.7 06/28/2017 0855   CHOLHDL 4.0 06/04/2014 1055   VLDL 40 06/04/2014 1055   LDLCALC 102 (H) 06/28/2017 0855    CBC    Component Value Date/Time   WBC 5.7 03/30/2017 1213   WBC 6.2 07/12/2013 1253   RBC 4.63 03/30/2017 1213   RBC 4.72 07/12/2013 1253   HGB 13.4 03/30/2017 1213   HCT 39.5 03/30/2017 1213   PLT 287 03/30/2017 1213   MCV 85 03/30/2017 1213   MCH 28.9 03/30/2017 1213   MCH 28.8 07/12/2013 1253   MCHC 33.9 03/30/2017 1213   MCHC 34.6 07/12/2013 1253   RDW 13.9 03/30/2017 1213   LYMPHSABS 2.3 07/12/2013 1253   MONOABS 0.6 07/12/2013 1253   EOSABS 0.2 07/12/2013 1253   BASOSABS 0.0 07/12/2013 1253    ASSESSMENT AND PLAN: 1. Diabetes mellitus type 2, uncontrolled, without complications ([facility]) I6E has improved but not at goal.  She will continue Januvia and metformin.  Will work on increasing physical activity again once the right toe is healed up. - POCT glucose  (manual entry) - POCT glycosylated hemoglobin (Hb A1C) - Microalbumin / creatinine urine ratio - CBC - Hepatic Function Panel - pravastatin (PRAVACHOL) 40 MG tablet; Take 1 tablet (40 mg total) by mouth daily.  Dispense: 30 tablet; Refill: 11  2. Hyperlipidemia, unspecified hyperlipidemia type - pravastatin (PRAVACHOL) 40 MG tablet; Take 1 tablet (40 mg total) by mouth daily.  Dispense: 30 tablet; Refill: 11  3. Obesity (BMI 30-39.9) See #1 above  4. Ingrown toenail of right foot with infection Advised patient to avoid cutting nails too short.  We will placed on Keflex antibiotics and bring him back in 1 week to make sure this is healing well - cephALEXin (KEFLEX) 500 MG capsule; Take 1 capsule (500 mg total) by mouth 4 (four) times daily.  Dispense: 28 capsule; Refill: 0   Patient was given the opportunity to ask questions.  Patient verbalized understanding of the plan and was able to repeat key elements of the plan.  [employer] interpreter used during this encounter. #[id number]  Orders Placed This Encounter  Procedures  . Microalbumin / creatinine urine ratio  . CBC  . Hepatic Function Panel  . POCT glucose (manual entry)  . POCT glycosylated hemoglobin (Hb A1C)     Requested Prescriptions   Signed Prescriptions Disp Refills  .  pravastatin (PRAVACHOL) 40 MG tablet 30 tablet 11    Sig: Take 1 tablet (40 mg total) by mouth daily.  . cephALEXin (KEFLEX) 500 MG capsule 28 capsule 0    Sig: Take 1 capsule (500 mg total) by mouth 4 (four) times daily.    Return in about 1 week (around 04/06/2018) for Recheck toe infection.  Karle Plumber, MD, FACP

## 2018-03-30 NOTE — Progress Notes (Signed)
Patient is here to follow up on diabetes.

## 2018-03-31 LAB — CBC
Hematocrit: 41.2 % (ref 34.0–46.6)
Hemoglobin: 14 g/dL (ref 11.1–15.9)
MCH: 29.3 pg (ref 26.6–33.0)
MCHC: 34 g/dL (ref 31.5–35.7)
MCV: 86 fL (ref 79–97)
Platelets: 282 10*3/uL (ref 150–450)
RBC: 4.78 x10E6/uL (ref 3.77–5.28)
RDW: 12.7 % (ref 11.7–15.4)
WBC: 5.9 10*3/uL (ref 3.4–10.8)

## 2018-03-31 LAB — MICROALBUMIN / CREATININE URINE RATIO
Creatinine, Urine: 67.3 mg/dL
Microalb/Creat Ratio: <4 mg/g creat (ref 0–29)

## 2018-03-31 LAB — HEPATIC FUNCTION PANEL
ALT: 29 IU/L (ref 0–32)
AST: 21 IU/L (ref 0–40)
Albumin: 4.7 g/dL (ref 3.8–4.9)
Alkaline Phosphatase: 105 IU/L (ref 39–117)
Bilirubin Total: 0.6 mg/dL (ref 0.0–1.2)
Bilirubin, Direct: 0.13 mg/dL (ref 0.00–0.40)
Total Protein: 7.3 g/dL (ref 6.0–8.5)

## 2018-04-06 ENCOUNTER — Ambulatory Visit: Payer: Self-pay | Admitting: Internal Medicine

## 2018-04-06 ENCOUNTER — Ambulatory Visit: Payer: Self-pay | Attending: Internal Medicine | Admitting: Internal Medicine

## 2018-04-06 ENCOUNTER — Other Ambulatory Visit: Payer: Self-pay

## 2018-04-06 ENCOUNTER — Encounter: Payer: Self-pay | Admitting: Internal Medicine

## 2018-04-06 VITALS — BP 115/63 | HR 77 | Temp 98.8°F | Resp 16 | Wt 203.0 lb

## 2018-04-06 DIAGNOSIS — L089 Local infection of the skin and subcutaneous tissue, unspecified: Secondary | ICD-10-CM

## 2018-04-06 NOTE — Progress Notes (Signed)
Patient ID: [name] [name], female    DOB: 03/13/1960  MRN: [id number]  CC: Follow-up (1 week )   Subjective: [name] [name] is a 58 y.o. female who presents for recheck on toe infection Her concerns today include:  Pt with hx of DM type 2, obesity, NAFLD, HL, GERD  Patient seen 1 week ago with infection to the right big toe.  She was placed on Keflex.  Patient reports compliance with the medication.  She reports that the drainage pain and swelling have gone away. Patient Active Problem List   Diagnosis Date Noted  . Obesity (BMI 30-39.9) 09/29/2016  . NAFL (nonalcoholic fatty liver) 66/29/4765  . Irritant contact dermatitis due to other chemical products 05/20/2016  . Diabetes type 2, controlled ([place]) 10/21/2014  . Hyperlipidemia 11/04/2013  . Environmental allergies 07/12/2013  . Bell's palsy 11/06/2012  . GERD 11/04/2005     Current Outpatient Medications on File Prior to Visit  Medication Sig Dispense Refill  . aspirin 325 MG tablet Take 1 tablet (325 mg total) by mouth daily. 30 tablet 0  . Blood Glucose Monitoring Suppl (TRUE METRIX METER) w/Device KIT Use as directed 1 kit 0  . cephALEXin (KEFLEX) 500 MG capsule Take 1 capsule (500 mg total) by mouth 4 (four) times daily. 28 capsule 0  . cetirizine (ZYRTEC) 10 MG tablet Take 1 tablet (10 mg total) by mouth 2 (two) times daily. 60 tablet 5  . fluocinonide ointment (LIDEX) 4.65 % Apply 1 application topically 2 (two) times daily. To palms 60 g 0  . glucose blood (TRUE METRIX BLOOD GLUCOSE TEST) test strip Use as instructed 100 each 12  . ketotifen (ZADITOR) 0.025 % ophthalmic solution Place 1 drop into the right eye 2 (two) times daily. 10 mL 1  . metFORMIN (GLUCOPHAGE) 500 MG tablet Take 2 tablets (1,000 mg total) by mouth 2 (two) times daily with a meal. 120 tablet 11  . omeprazole (PRILOSEC) 20 MG capsule Take 1 capsule (20 mg total) by mouth daily. 30 capsule 5  . pravastatin (PRAVACHOL) 40 MG  tablet Take 1 tablet (40 mg total) by mouth daily. 30 tablet 11  . sitaGLIPtin (JANUVIA) 50 MG tablet Take 1 tablet (50 mg total) by mouth daily. 90 tablet PRN  . TRUEPLUS LANCETS 28G MISC Use as directed 100 each 1   No current facility-administered medications on file prior to visit.     No Known Allergies  Social History   Socioeconomic History  . Marital status: Married    Spouse name: Not on file  . Number of children: Not on file  . Years of education: Not on file  . Highest education level: Not on file  Occupational History  . Not on file  Social Needs  . Financial resource strain: Not on file  . Food insecurity:    Worry: Not on file    Inability: Not on file  . Transportation needs:    Medical: Not on file    Non-medical: Not on file  Tobacco Use  . Smoking status: Never Smoker  . Smokeless tobacco: Never Used  Substance and Sexual Activity  . Alcohol use: No  . Drug use: No  . Sexual activity: Not on file  Lifestyle  . Physical activity:    Days per week: Not on file    Minutes per session: Not on file  . Stress: Not on file  Relationships  . Social connections:    Talks on phone: Not on file  Gets together: Not on file    Attends religious service: Not on file    Active member of club or organization: Not on file    Attends meetings of clubs or organizations: Not on file    Relationship status: Not on file  . Intimate partner violence:    Fear of current or ex partner: Not on file    Emotionally abused: Not on file    Physically abused: Not on file    Forced sexual activity: Not on file  Other Topics Concern  . Not on file  Social History Narrative  . Not on file    Family History  Problem Relation Age of Onset  . Hypertension Mother   . Diabetes Mother   . Diabetes Father   . Colon cancer Neg Hx     Past Surgical History:  Procedure Laterality Date  . TUBAL LIGATION  1995    ROS: Review of Systems Negative except as stated above   PHYSICAL EXAM: BP 115/63   Pulse 77   Temp 98.8 F (37.1 C) (Oral)   Resp 16   Wt 203 lb (92.1 kg)   SpO2 97%   BMI 37.13 kg/m   Physical Exam  General appearance - alert, well appearing, and in no distress Mental status - normal mood, behavior, speech, dress, motor activity, and thought processes Skin -evaluation of the right big toe reveals no drainage around the nail bed.  There is no erythema or edema.   CMP Latest Ref Rng & Units 03/30/2018 03/30/2017 06/16/2016  Glucose 65 - 99 mg/dL - 115(H) 135(H)  BUN 6 - 24 mg/dL - 8 11  Creatinine 0.57 - 1.00 mg/dL - 0.58 0.74  Sodium 134 - 144 mmol/L - 141 143  Potassium 3.5 - 5.2 mmol/L - 4.1 4.6  Chloride 96 - 106 mmol/L - 102 102  CO2 20 - 29 mmol/L - 26 26  Calcium 8.7 - 10.2 mg/dL - 9.7 9.8  Total Protein 6.0 - 8.5 g/dL 7.3 7.2 7.5  Total Bilirubin 0.0 - 1.2 mg/dL 0.6 0.6 0.7  Alkaline Phos 39 - 117 IU/L 105 98 99  AST 0 - 40 IU/L 21 32 26  ALT 0 - 32 IU/L 29 43(H) 36(H)   Lipid Panel     Component Value Date/Time   CHOL 190 06/28/2017 0855   TRIG 187 (H) 06/28/2017 0855   HDL 51 06/28/2017 0855   CHOLHDL 3.7 06/28/2017 0855   CHOLHDL 4.0 06/04/2014 1055   VLDL 40 06/04/2014 1055   LDLCALC 102 (H) 06/28/2017 0855    CBC    Component Value Date/Time   WBC 5.9 03/30/2018 1042   WBC 6.2 07/12/2013 1253   RBC 4.78 03/30/2018 1042   RBC 4.72 07/12/2013 1253   HGB 14.0 03/30/2018 1042   HCT 41.2 03/30/2018 1042   PLT 282 03/30/2018 1042   MCV 86 03/30/2018 1042   MCH 29.3 03/30/2018 1042   MCH 28.8 07/12/2013 1253   MCHC 34.0 03/30/2018 1042   MCHC 34.6 07/12/2013 1253   RDW 12.7 03/30/2018 1042   LYMPHSABS 2.3 07/12/2013 1253   MONOABS 0.6 07/12/2013 1253   EOSABS 0.2 07/12/2013 1253   BASOSABS 0.0 07/12/2013 1253    ASSESSMENT AND PLAN:  1. Toe infection This was an infection from an ingrown toenail where patient had clipped the nail too short.  This has resolved nicely with antibiotics.  Warned against  clipping the nails too short in the future.  Patient was  given the opportunity to ask questions.  Patient verbalized understanding of the plan and was able to repeat key elements of the plan.  [employer] interpreter used during this encounter. #[id number]    Requested Prescriptions    No prescriptions requested or ordered in this encounter    Return in about 3 months (around 07/07/2018).  Karle Plumber, MD, FACP

## 2018-05-22 MED FILL — ?PRAVASTATIN NA 40 MG TAB: 40 | 30 days supply | Qty: 30 | Fill #1

## 2018-05-28 MED FILL — ?METFORMIN HCL 500MG TABLET: 500 | 30 days supply | Qty: 120 | Fill #6

## 2018-06-14 IMAGING — MG DIGITAL SCREENING BILATERAL MAMMOGRAM WITH TOMO AND CAD
8 series · 8 of 24 positions shown · non-contrast
Comparison: Previous exam(s).

CLINICAL DATA: Screening.

EXAM:
DIGITAL SCREENING BILATERAL MAMMOGRAM WITH TOMO AND CAD

[L MLO synth-2D]
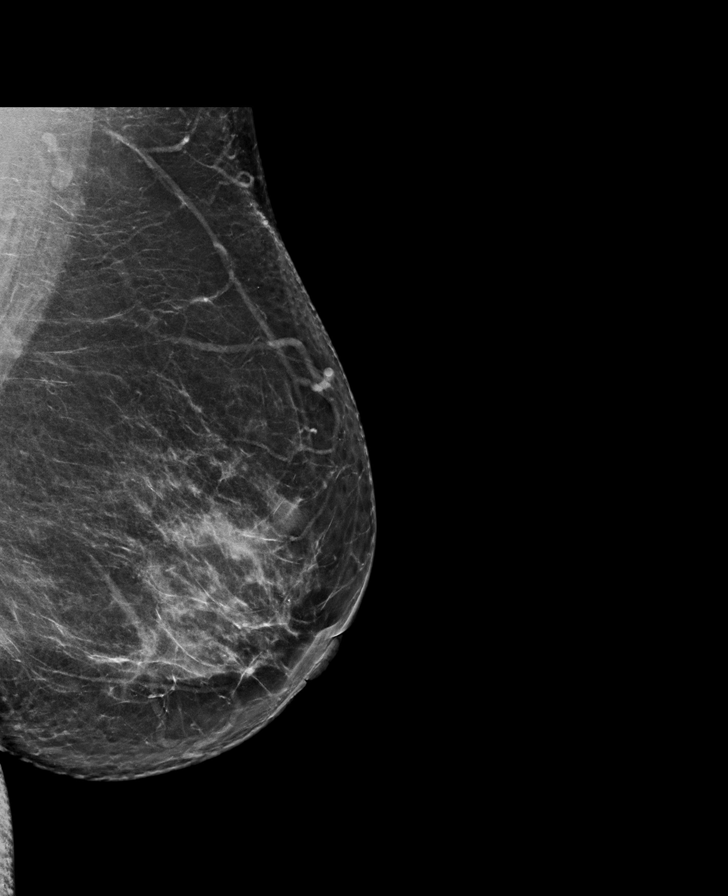

[L CC synth-2D]
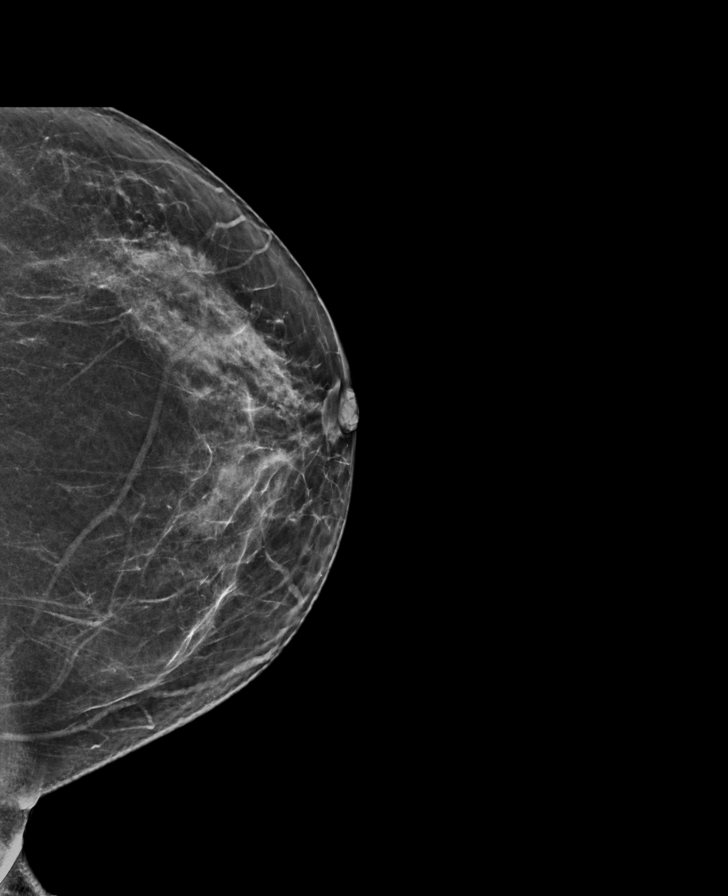

[R CC synth-2D]
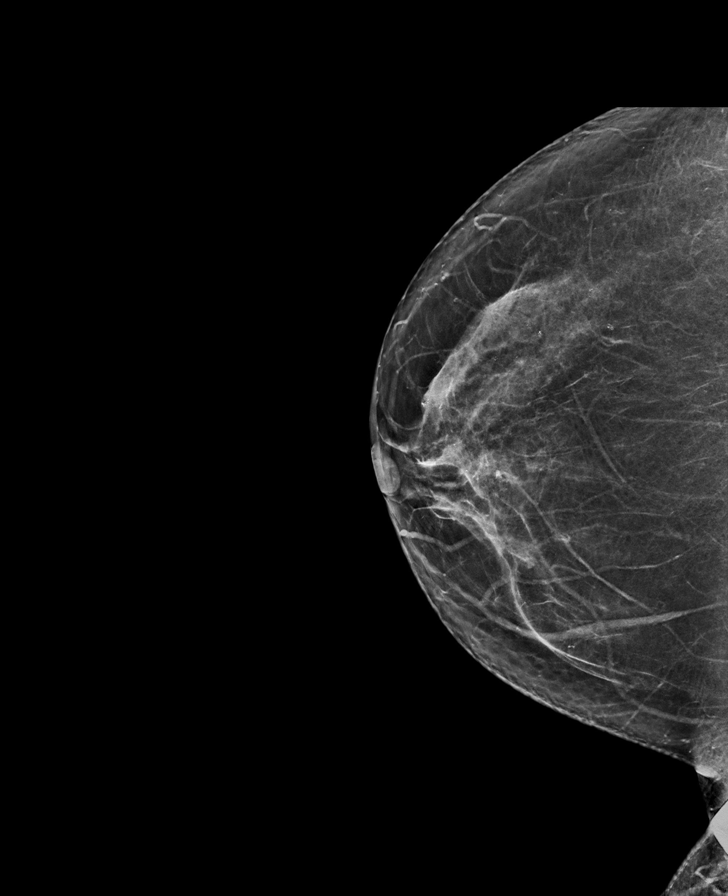

[R MLO synth-2D]
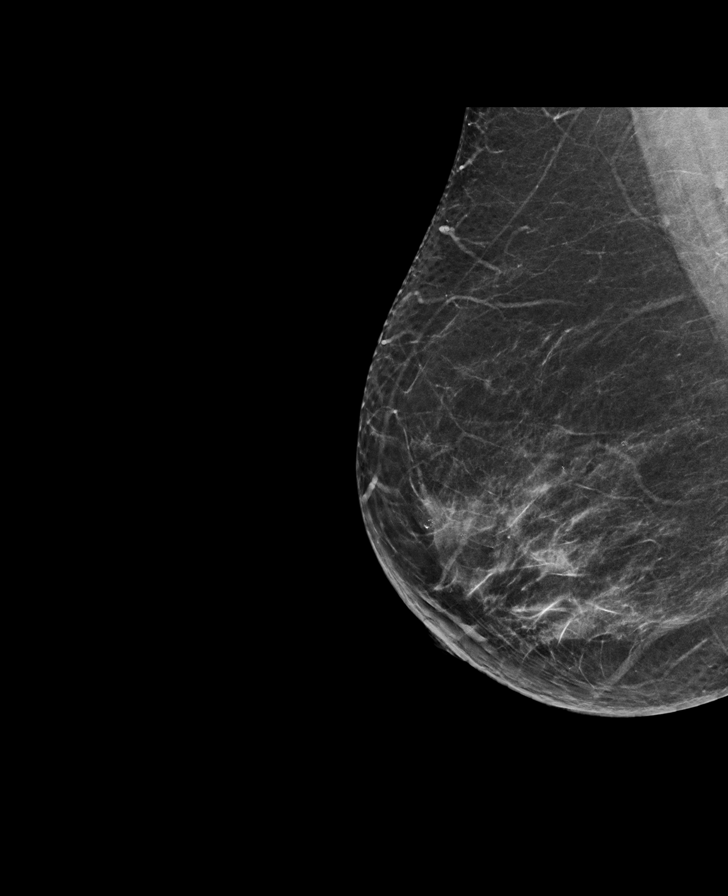

[L CC tomo · tomo slice 37/72.0]
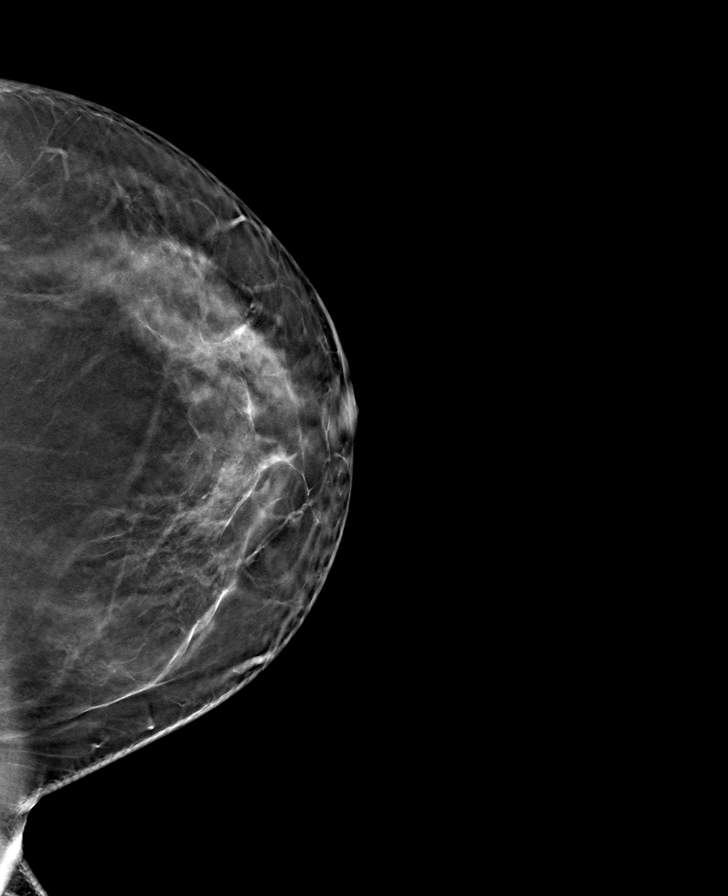

[L MLO tomo · tomo slice 41/80.0]
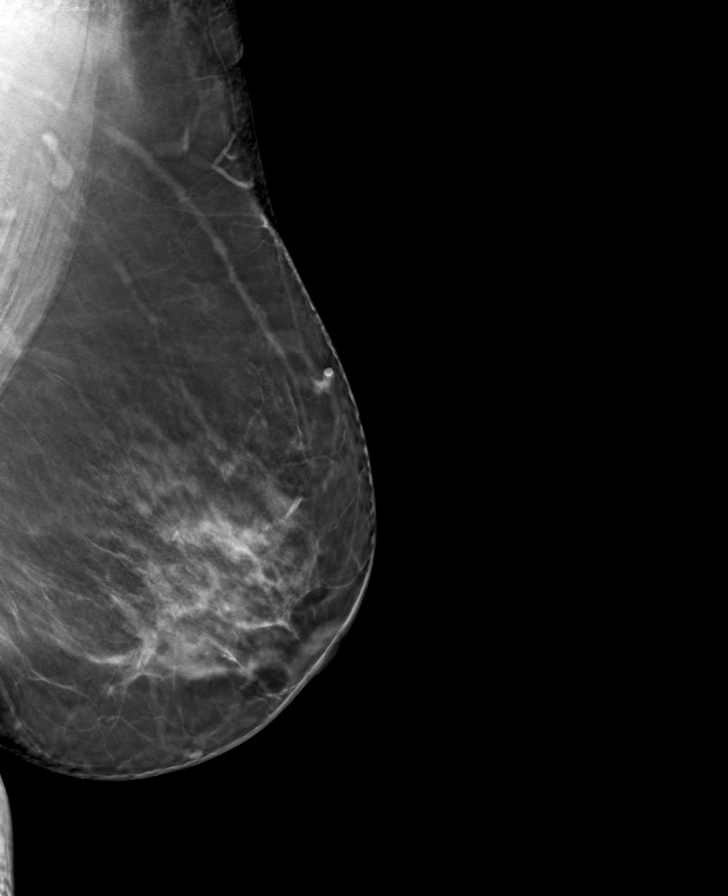

[R CC tomo · tomo slice 37/74.0]
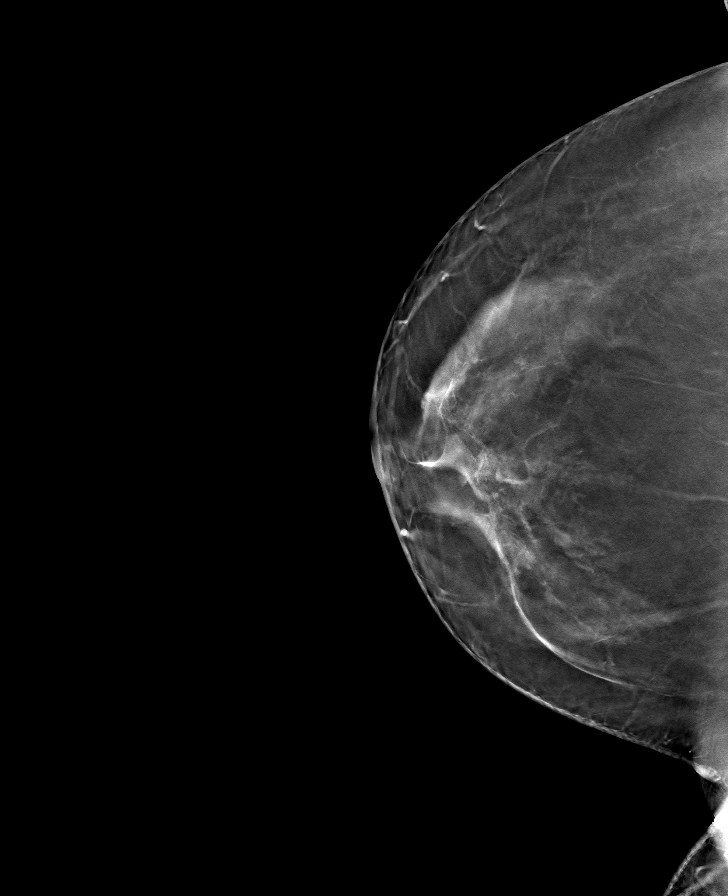

[R MLO tomo · tomo slice 37/73.0]
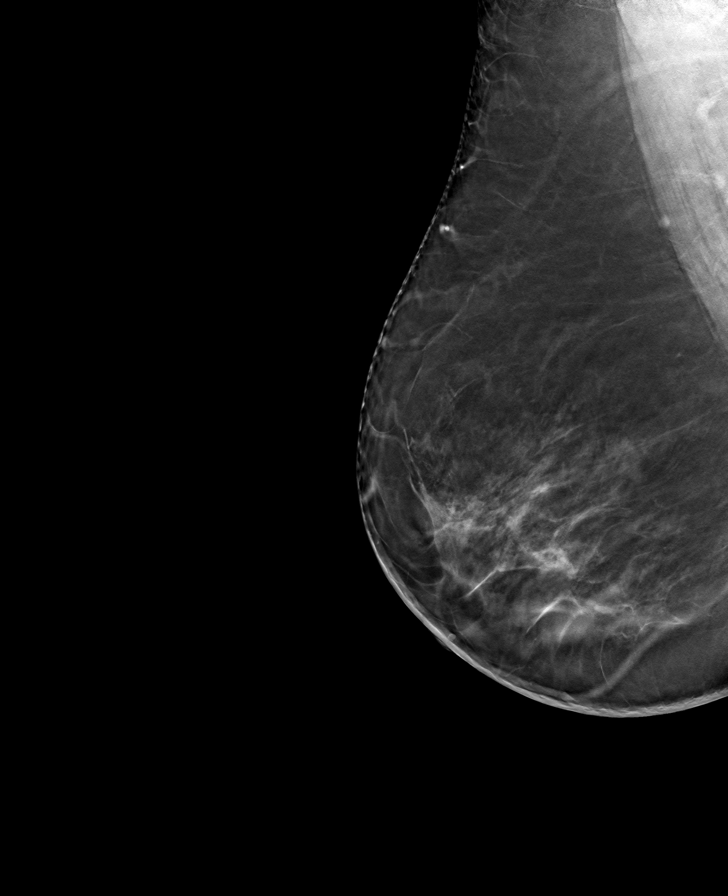

[8 of 24 positions shown; findings below may reference images not displayed]

ACR Breast Density Category c: The breast tissue is heterogeneously
dense, which may obscure small masses.
FINDINGS: There are no findings suspicious for malignancy. Images were
processed with CAD.
IMPRESSION: No mammographic evidence of malignancy. A result letter of this
screening mammogram will be mailed directly to the patient.

RECOMMENDATION:
Screening mammogram in one year. (Code:[id number]-[id number])

BI-RADS CATEGORY  1: Negative.

## 2018-06-26 ENCOUNTER — Other Ambulatory Visit: Payer: Self-pay | Admitting: Internal Medicine

## 2018-06-26 DIAGNOSIS — Z1231 Encounter for screening mammogram for malignant neoplasm of breast: Secondary | ICD-10-CM

## 2018-07-05 ENCOUNTER — Other Ambulatory Visit (HOSPITAL_COMMUNITY): Payer: Self-pay | Admitting: *Deleted

## 2018-07-05 DIAGNOSIS — Z1231 Encounter for screening mammogram for malignant neoplasm of breast: Secondary | ICD-10-CM

## 2018-07-06 ENCOUNTER — Ambulatory Visit: Payer: Self-pay | Admitting: Internal Medicine

## 2018-07-12 MED FILL — ?PRAVASTATIN NA 40 MG TAB: 40 | 30 days supply | Qty: 30 | Fill #2

## 2018-08-10 ENCOUNTER — Other Ambulatory Visit: Payer: Self-pay | Admitting: Internal Medicine

## 2018-08-10 DIAGNOSIS — IMO0001 Reserved for inherently not codable concepts without codable children: Secondary | ICD-10-CM

## 2018-08-10 MED FILL — ?PRAVASTATIN NA 40 MG TAB: 40 | 30 days supply | Qty: 30 | Fill #3

## 2018-08-10 MED FILL — ?METFORMIN HCL 500MG TABLET: 500 | 30 days supply | Qty: 120 | Fill #0

## 2018-08-27 ENCOUNTER — Ambulatory Visit: Payer: Self-pay | Attending: Family Medicine | Admitting: Pharmacist

## 2018-08-27 ENCOUNTER — Encounter: Payer: Self-pay | Admitting: Pharmacist

## 2018-08-27 ENCOUNTER — Encounter: Payer: Self-pay | Admitting: Internal Medicine

## 2018-08-27 ENCOUNTER — Other Ambulatory Visit: Payer: Self-pay

## 2018-08-27 ENCOUNTER — Ambulatory Visit: Payer: Self-pay | Attending: Internal Medicine | Admitting: Internal Medicine

## 2018-08-27 VITALS — BP 129/73 | HR 80 | Temp 98.3°F | Resp 16 | Wt 200.8 lb

## 2018-08-27 DIAGNOSIS — Z79899 Other long term (current) drug therapy: Secondary | ICD-10-CM

## 2018-08-27 DIAGNOSIS — IMO0001 Reserved for inherently not codable concepts without codable children: Secondary | ICD-10-CM

## 2018-08-27 DIAGNOSIS — E669 Obesity, unspecified: Secondary | ICD-10-CM

## 2018-08-27 DIAGNOSIS — E1165 Type 2 diabetes mellitus with hyperglycemia: Secondary | ICD-10-CM

## 2018-08-27 DIAGNOSIS — E785 Hyperlipidemia, unspecified: Secondary | ICD-10-CM

## 2018-08-27 LAB — POCT GLYCOSYLATED HEMOGLOBIN (HGB A1C): HbA1c, POC (controlled diabetic range): 7.6 % — AB (ref 0.0–7.0)

## 2018-08-27 LAB — GLUCOSE, POCT (MANUAL RESULT ENTRY): POC Glucose: 147 mg/dl — AB (ref 70–99)

## 2018-08-27 MED ORDER — SITAGLIPTIN PHOSPHATE 50 MG PO TABS: 50.0000 mg | ORAL_TABLET | Freq: Every day | ORAL | 4 refills | Status: DC

## 2018-08-27 NOTE — Progress Notes (Signed)
Patient ID: [name] [name], female    DOB: [birth year]  MRN: [id number]  CC: Diabetes   Subjective: [name] [name] is a 58 y.o. female who presents for chronic disease management. Her concerns today include:  Pt with hx of DM type 2, obesity, NAFLD, HL, GERD  DIABETES TYPE 2/Obesity Last A1C:   Results for orders placed or performed in visit on 08/27/18  POCT glucose (manual entry)  Result Value Ref Range   POC Glucose 147 (A) 70 - 99 mg/dl  POCT glycosylated hemoglobin (Hb A1C)  Result Value Ref Range   Hemoglobin A1C     HbA1c POC (<> result, manual entry)     HbA1c, POC (prediabetic range)     HbA1c, POC (controlled diabetic range) 7.6 (A) 0.0 - 7.0 %    Med Adherence:  _0  Yes    _1  No - admits that she takes Metformin consistently but forgets to take Januvia 2-3 x a wk. denies any side effects from the [place]. Medication side effects:  _2  Yes    _3  No Home Monitoring?  _4  Yes    _5  No - device not working.  Not sure if she just need battery or new device Home glucose results range:  She brought the meter with her today Diet Adherence: _6  Yes  - drinks mainly water, eats fruits and veggies Exercise: _7  Yes - walking 3 x a wk for exercise    Hypoglycemic episodes?: _8  Yes    _9  No Numbness of the feet? _10  Yes    _11  No Retinopathy hx? _12  Yes    _13  No Last eye exam: Wears rxn glasses.  Had some blurred vision yesterday.  Last eye exam was 1 yr ago at [facility].  She plans to schedule another exam.  Comments:   HL:  Tolerating and taking Pravachol  Patient Active Problem List   Diagnosis Date Noted  . Obesity (BMI 30-39.9) 09/29/2016  . NAFL (nonalcoholic fatty liver) 19/62/2297  . Irritant contact dermatitis due to other chemical products 05/20/2016  . Diabetes type 2, controlled ([place]) 10/21/2014  . Hyperlipidemia 11/04/2013  . Environmental allergies 07/12/2013  . Bell's palsy 11/06/2012  . GERD 11/04/2005     Current Outpatient  Medications on File Prior to Visit  Medication Sig Dispense Refill  . aspirin 325 MG tablet Take 1 tablet (325 mg total) by mouth daily. 30 tablet 0  . Blood Glucose Monitoring Suppl (TRUE METRIX METER) w/Device KIT Use as directed 1 kit 0  . cetirizine (ZYRTEC) 10 MG tablet Take 1 tablet (10 mg total) by mouth 2 (two) times daily. 60 tablet 5  . glucose blood (TRUE METRIX BLOOD GLUCOSE TEST) test strip Use as instructed 100 each 12  . ketotifen (ZADITOR) 0.025 % ophthalmic solution Place 1 drop into the right eye 2 (two) times daily. 10 mL 1  . metFORMIN (GLUCOPHAGE) 500 MG tablet TAKE 2 TABLETS (1,000 MG TOTAL) BY MOUTH 2 (TWO) TIMES DAILY WITH A MEAL. 120 tablet 0  . omeprazole (PRILOSEC) 20 MG capsule Take 1 capsule (20 mg total) by mouth daily. 30 capsule 5  . pravastatin (PRAVACHOL) 40 MG tablet Take 1 tablet (40 mg total) by mouth daily. 30 tablet 11  . TRUEPLUS LANCETS 28G MISC Use as directed 100 each 1   No current facility-administered medications on file prior to visit.     No Known Allergies  Social History   Socioeconomic History  . Marital status: Married    Spouse name: Not  on file  . Number of children: Not on file  . Years of education: Not on file  . Highest education level: Not on file  Occupational History  . Not on file  Social Needs  . Financial resource strain: Not on file  . Food insecurity    Worry: Not on file    Inability: Not on file  . Transportation needs    Medical: Not on file    Non-medical: Not on file  Tobacco Use  . Smoking status: Never Smoker  . Smokeless tobacco: Never Used  Substance and Sexual Activity  . Alcohol use: No  . Drug use: No  . Sexual activity: Not on file  Lifestyle  . Physical activity    Days per week: Not on file    Minutes per session: Not on file  . Stress: Not on file  Relationships  . Social Herbalist on phone: Not on file    Gets together: Not on file    Attends religious service: Not on file     Active member of club or organization: Not on file    Attends meetings of clubs or organizations: Not on file    Relationship status: Not on file  . Intimate partner violence    Fear of current or ex partner: Not on file    Emotionally abused: Not on file    Physically abused: Not on file    Forced sexual activity: Not on file  Other Topics Concern  . Not on file  Social History Narrative  . Not on file    Family History  Problem Relation Age of Onset  . Hypertension Mother   . Diabetes Mother   . Diabetes Father   . Colon cancer Neg Hx     Past Surgical History:  Procedure Laterality Date  . TUBAL LIGATION  1995    ROS: Review of Systems Negative except as stated above  PHYSICAL EXAM: BP 129/73   Pulse 80   Temp 98.3 F (36.8 C) (Oral)   Resp 16   Wt 200 lb 12.8 oz (91.1 kg)   SpO2 96%   BMI 36.73 kg/m   Wt Readings from Last 3 Encounters:  08/27/18 200 lb 12.8 oz (91.1 kg)  04/06/18 203 lb (92.1 kg)  03/30/18 201 lb (91.2 kg)    Physical Exam  General appearance - alert, well appearing, and in no distress Mental status - normal mood, behavior, speech, dress, motor activity, and thought processes Neck - supple, no significant adenopathy Chest - clear to auscultation, no wheezes, rales or rhonchi, symmetric air entry Heart - normal rate, regular rhythm, normal S1, S2, no murmurs, rubs, clicks or gallops Extremities - peripheral pulses normal, no pedal edema, no clubbing or cyanosis Diabetic Foot Exam - Simple   Simple Foot Form Visual Inspection No deformities, no ulcerations, no other skin breakdown bilaterally: Yes Sensation Testing Intact to touch and monofilament testing bilaterally: Yes Pulse Check Posterior Tibialis and Dorsalis pulse intact bilaterally: Yes Comments     Results for orders placed or performed in visit on 08/27/18  POCT glucose (manual entry)  Result Value Ref Range   POC Glucose 147 (A) 70 - 99 mg/dl  POCT glycosylated  hemoglobin (Hb A1C)  Result Value Ref Range   Hemoglobin A1C     HbA1c POC (<> result, manual entry)     HbA1c, POC (prediabetic range)     HbA1c, POC (controlled diabetic range) 7.6 (A) 0.0 -  7.0 %     CMP Latest Ref Rng & Units 03/30/2018 03/30/2017 06/16/2016  Glucose 65 - 99 mg/dL - 115(H) 135(H)  BUN 6 - 24 mg/dL - 8 11  Creatinine 0.57 - 1.00 mg/dL - 0.58 0.74  Sodium 134 - 144 mmol/L - 141 143  Potassium 3.5 - 5.2 mmol/L - 4.1 4.6  Chloride 96 - 106 mmol/L - 102 102  CO2 20 - 29 mmol/L - 26 26  Calcium 8.7 - 10.2 mg/dL - 9.7 9.8  Total Protein 6.0 - 8.5 g/dL 7.3 7.2 7.5  Total Bilirubin 0.0 - 1.2 mg/dL 0.6 0.6 0.7  Alkaline Phos 39 - 117 IU/L 105 98 99  AST 0 - 40 IU/L 21 32 26  ALT 0 - 32 IU/L 29 43(H) 36(H)   Lipid Panel     Component Value Date/Time   CHOL 190 06/28/2017 0855   TRIG 187 (H) 06/28/2017 0855   HDL 51 06/28/2017 0855   CHOLHDL 3.7 06/28/2017 0855   CHOLHDL 4.0 06/04/2014 1055   VLDL 40 06/04/2014 1055   LDLCALC 102 (H) 06/28/2017 0855    CBC    Component Value Date/Time   WBC 5.9 03/30/2018 1042   WBC 6.2 07/12/2013 1253   RBC 4.78 03/30/2018 1042   RBC 4.72 07/12/2013 1253   HGB 14.0 03/30/2018 1042   HCT 41.2 03/30/2018 1042   PLT 282 03/30/2018 1042   MCV 86 03/30/2018 1042   MCH 29.3 03/30/2018 1042   MCH 28.8 07/12/2013 1253   MCHC 34.0 03/30/2018 1042   MCHC 34.6 07/12/2013 1253   RDW 12.7 03/30/2018 1042   LYMPHSABS 2.3 07/12/2013 1253   MONOABS 0.6 07/12/2013 1253   EOSABS 0.2 07/12/2013 1253   BASOSABS 0.0 07/12/2013 1253    ASSESSMENT AND PLAN: 1. Diabetes mellitus type 2, uncontrolled, without complications ([place]) Advised to get a med box so that she can weigh out her medications weekly which may help her to remember to take the Januvia as well as the metformin. Continue healthy eating habits and regular exercise. Advised to get her eye exam done Clinical pharmacist to check her meter today to see whether she needs a  battery or new device - POCT glucose (manual entry) - POCT glycosylated hemoglobin (Hb A1C) - sitaGLIPtin (JANUVIA) 50 MG tablet; Take 1 tablet (50 mg total) by mouth daily.  Dispense: 90 tablet; Refill: 4 - Lipid panel - Basic metabolic panel  2. Obesity (BMI 30-39.9) See #1 above  3. Hyperlipidemia, unspecified hyperlipidemia type Continue Pravachol    Patient was given the opportunity to ask questions.  Patient verbalized understanding of the plan and was able to repeat key elements of the plan.  [employer] interpreter used during this encounter. #[id number]   Orders Placed This Encounter  Procedures  . Lipid panel  . Basic metabolic panel  . POCT glucose (manual entry)  . POCT glycosylated hemoglobin (Hb A1C)     Requested Prescriptions   Signed Prescriptions Disp Refills  . sitaGLIPtin (JANUVIA) 50 MG tablet 90 tablet 4    Sig: Take 1 tablet (50 mg total) by mouth daily.    Return in about 5 weeks (around 10/01/2018) for pap.  Karle Plumber, MD, FACP

## 2018-08-27 NOTE — Progress Notes (Signed)
Troubleshooting provided for patient's True Metrix meter. Pt with no power to the machine, first noticed ~5 months ago. Pt reports having the meter for a while with good functionality prior to 5 months ago. Likely a dead batter; provided patient with information on where to obtain glucometer battery. She can bring the meter back to me with any continued issue.  Patient was educated on the use of the True Metrix blood glucose meter. Reviewed necessary supplies and operation of the meter. Also reviewed goal blood glucose levels. Patient was able to demonstrate use. All questions and concerns were addressed.

## 2018-08-28 LAB — BASIC METABOLIC PANEL
BUN/Creatinine Ratio: 19 (ref 9–23)
BUN: 11 mg/dL (ref 6–24)
CO2: 24 mmol/L (ref 20–29)
Calcium: 9.5 mg/dL (ref 8.7–10.2)
Chloride: 104 mmol/L (ref 96–106)
Creatinine, Ser: 0.58 mg/dL (ref 0.57–1.00)
GFR calc Af Amer: 118 mL/min/{1.73_m2} (ref >59)
GFR calc non Af Amer: 103 mL/min/{1.73_m2} (ref >59)
Glucose: 133 mg/dL — ABNORMAL HIGH (ref 65–99)
Potassium: 4.5 mmol/L (ref 3.5–5.2)
Sodium: 141 mmol/L (ref 134–144)

## 2018-08-28 LAB — LIPID PANEL
Chol/HDL Ratio: 4.2 ratio (ref 0.0–4.4)
Cholesterol, Total: 215 mg/dL — ABNORMAL HIGH (ref 100–199)
HDL: 51 mg/dL (ref >39)
LDL Calculated: 128 mg/dL — ABNORMAL HIGH (ref 0–99)
Triglycerides: 181 mg/dL — ABNORMAL HIGH (ref 0–149)
VLDL Cholesterol Cal: 36 mg/dL (ref 5–40)

## 2018-08-29 ENCOUNTER — Telehealth: Payer: Self-pay

## 2018-08-29 NOTE — Telephone Encounter (Signed)
[employer] interpre[employer] lorena  Id# [phone]  contacted pt to go over lab results pt didn't answer left a detailed vm informing pt of results and if she has any questions or concerns to give me a call

## 2018-09-11 MED FILL — PRAVASTATIN NA 40 MG TAB: 40 | 30 days supply | Qty: 30 | Fill #4

## 2018-09-27 ENCOUNTER — Encounter (HOSPITAL_COMMUNITY): Payer: Self-pay

## 2018-09-27 ENCOUNTER — Ambulatory Visit (HOSPITAL_COMMUNITY)
Admission: RE | Admit: 2018-09-27 | Discharge: 2018-09-27 | Disposition: A | Discharge status: Home / Self Care | Payer: Self-pay | Source: Ambulatory Visit | Attending: Obstetrics and Gynecology | Admitting: Obstetrics and Gynecology

## 2018-09-27 ENCOUNTER — Ambulatory Visit
Admission: RE | Admit: 2018-09-27 | Discharge: 2018-09-27 | Disposition: A | Discharge status: Home / Self Care | Payer: No Typology Code available for payment source | Source: Ambulatory Visit | Attending: Obstetrics and Gynecology | Admitting: Obstetrics and Gynecology

## 2018-09-27 ENCOUNTER — Other Ambulatory Visit: Payer: Self-pay

## 2018-09-27 DIAGNOSIS — Z1231 Encounter for screening mammogram for malignant neoplasm of breast: Secondary | ICD-10-CM

## 2018-09-27 DIAGNOSIS — Z1239 Encounter for other screening for malignant neoplasm of breast: Secondary | ICD-10-CM | POA: Insufficient documentation

## 2018-09-27 NOTE — Patient Instructions (Signed)
Explained breast self awareness [name] [name]. Patient did not need a Pap smear today due to last Pap smear and HPV typing was 06/19/2015.  Let her know BCCCP will cover Pap smears and HPV typing every 5 years unless has a history of abnormal Pap smears. Referred patient to the [facility] for a screening mammogram. Appointment scheduled for Thursday, September 27, 2018 at 1540. Patient aware of appointment and will be there. Let patient know the [facility] will follow up with her within the next couple weeks with results of mammogram by letter or phone. [name] verbalized understanding.  Armstrong Creasy, Arvil Chaco, RN 3:00 PM

## 2018-09-27 NOTE — Progress Notes (Signed)
No complaints today.   Pap Smear: Pap smear not completed today. Last Pap smear was 06/19/2015 at [facility] and normal with negative HPV. Per patient has no history of an abnormal Pap smear. Last Pap smear result is in Epic.  Physical exam: Breasts Breasts symmetrical. No skin abnormalities bilateral breasts. No nipple retraction bilateral breasts. No nipple discharge bilateral breasts. No lymphadenopathy. No lumps palpated bilateral breasts. No complaints of pain or tenderness on exam. Referred patient to the [facility] for a screening mammogram. Appointment scheduled for Thursday, September 27, 2018 at 1540.        Pelvic/Bimanual No Pap smear completed today since last Pap smear and HPV typing was 06/19/2015. Pap smear not indicated per BCCCP guidelines.   Smoking History: Patient has never smoked.  Patient Navigation: Patient education provided. Access to services provided for patient through [facility] program. Spanish interpreter provided.   Colorectal Cancer Screening: Per patient had a colonoscopy completed 2 years ago. No complaints today.   Breast and Cervical Cancer Risk Assessment: Patient has no family history of breast cancer, known genetic mutations, or radiation treatment to the chest before age 17. Patient has no history of cervical dysplasia, immunocompromised, or DES exposure in-utero.  Risk Assessment    Risk Scores      09/27/2018   Last edited by: Armond Hang, LPN   5-year risk: 0.6 %   Lifetime risk: 3.6 %         Used Spanish interpreter Rudene Anda from [place].

## 2018-10-01 ENCOUNTER — Encounter (HOSPITAL_COMMUNITY): Payer: Self-pay | Admitting: *Deleted

## 2018-10-10 MED FILL — PRAVASTATIN NA 40 MG TAB: 40 | 30 days supply | Qty: 30 | Fill #5

## 2018-10-12 ENCOUNTER — Ambulatory Visit: Payer: Self-pay | Attending: Internal Medicine

## 2018-10-12 ENCOUNTER — Other Ambulatory Visit: Payer: Self-pay

## 2018-10-18 ENCOUNTER — Encounter: Payer: Self-pay | Admitting: Internal Medicine

## 2018-11-16 ENCOUNTER — Other Ambulatory Visit: Payer: Self-pay | Admitting: Internal Medicine

## 2018-11-16 MED FILL — PRAVASTATIN NA 40 MG TAB: 40 | 30 days supply | Qty: 30 | Fill #6

## 2018-11-16 MED FILL — ?METFORMIN HCL 500MG TABLET: 500 | 30 days supply | Qty: 120 | Fill #0

## 2018-11-29 ENCOUNTER — Ambulatory Visit: Payer: Self-pay | Admitting: Internal Medicine

## 2018-11-29 MED FILL — metFORMIN HCL 500 MG TABS: 500 | 30 days supply | Qty: 120 | Fill #0

## 2018-11-29 MED FILL — PRAVASTATIN NA 40 MG TAB: 40 | 30 days supply | Qty: 30 | Fill #6

## 2019-01-04 MED FILL — PRAVASTATIN NA 40 MG TAB: 40 | 30 days supply | Qty: 30 | Fill #7

## 2019-01-17 ENCOUNTER — Other Ambulatory Visit: Payer: Self-pay

## 2019-01-17 ENCOUNTER — Encounter: Payer: Self-pay | Admitting: Internal Medicine

## 2019-01-17 ENCOUNTER — Other Ambulatory Visit: Payer: Self-pay | Admitting: Internal Medicine

## 2019-01-17 ENCOUNTER — Ambulatory Visit: Payer: Self-pay | Attending: Internal Medicine | Admitting: Internal Medicine

## 2019-01-17 DIAGNOSIS — Z23 Encounter for immunization: Secondary | ICD-10-CM

## 2019-01-17 DIAGNOSIS — E785 Hyperlipidemia, unspecified: Secondary | ICD-10-CM

## 2019-01-17 DIAGNOSIS — E119 Type 2 diabetes mellitus without complications: Secondary | ICD-10-CM

## 2019-01-17 DIAGNOSIS — K219 Gastro-esophageal reflux disease without esophagitis: Secondary | ICD-10-CM

## 2019-01-17 DIAGNOSIS — E1169 Type 2 diabetes mellitus with other specified complication: Secondary | ICD-10-CM

## 2019-01-17 MED FILL — OMEPRAZOLE 20 MG CAP: 20 | 30 days supply | Qty: 30 | Fill #0

## 2019-01-17 MED FILL — PRAVASTATIN NA 40 MG TAB: 40 | 30 days supply | Qty: 30 | Fill #7

## 2019-01-17 NOTE — Progress Notes (Signed)
Virtual Visit via Telephone Note Due to current restrictions/limitations of in-office visits due to the COVID-19 pandemic, this scheduled clinical appointment was converted to a telehealth visit  I connected with [name] [name] on 01/17/19 at 10:47 a.m by telephone and verified that I am speaking with the correct person using two identifiers. I am in my office.  The patient is at home.  Only the patient, myself and Ishmael Holter from [employer] [phone]) participated in this encounter.  I discussed the limitations, risks, security and privacy concerns of performing an evaluation and management service by telephone and the availability of in person appointments. I also discussed with the patient that there may be a patient responsible charge related to this service. The patient expressed understanding and agreed to proceed.   History of Present Illness: Pt with hx of DM type 2, obesity, NAFLD, HL, GERD.  Last seen 08/2018. Purpose of today's visit is chronic ds management.  DIABETES TYPE 2 Last A1C 7.6 Med Adherence:  [x] Yes.  Since last visit, she has been compliant with Januvia and Metformin     Medication side effects:  [] Yes    [x] No Home Monitoring?  [x] Yes 2 x a wk Home glucose results range: most recent reading 157 this morning. 150-180 Diet Adherence: [x] Yes   Exercise: [x] Yes -walking daily  Hypoglycemic episodes?: [] Yes    [x] No Numbness of the feet? [] Yes    [x] No Retinopathy hx? [] Yes    [] No Last eye exam:  No blurred vision.  She still has not gotten eye exam done since last visit Comments:   HL:  Informed of lab results of last visit.  No at goal. Reports compliance with Pravachol since last visit.     Outpatient Encounter Medications as of 01/17/2019  Medication Sig  . aspirin 325 MG tablet Take 1 tablet (325 mg total) by mouth daily. (Patient not taking: Reported on 09/27/2018)  . Blood Glucose Monitoring Suppl (TRUE METRIX METER) w/Device KIT  Use as directed  . cetirizine (ZYRTEC) 10 MG tablet Take 1 tablet (10 mg total) by mouth 2 (two) times daily.  Marland Kitchen glucose blood (TRUE METRIX BLOOD GLUCOSE TEST) test strip Use as instructed  . ketotifen (ZADITOR) 0.025 % ophthalmic solution Place 1 drop into the right eye 2 (two) times daily.  . metFORMIN (GLUCOPHAGE) 500 MG tablet TAKE 2 TABLETS (1,000 MG TOTAL) BY MOUTH 2 (TWO) TIMES DAILY WITH A MEAL.  Marland Kitchen omeprazole (PRILOSEC) 20 MG capsule TAKE 1 CAPSULE BY MOUTH DAILY.  . pravastatin (PRAVACHOL) 40 MG tablet Take 1 tablet (40 mg total) by mouth daily.  . sitaGLIPtin (JANUVIA) 50 MG tablet Take 1 tablet (50 mg total) by mouth daily.  . TRUEPLUS LANCETS 28G MISC Use as directed   No facility-administered encounter medications on file as of 01/17/2019.    Observations/Objective: Results for orders placed or performed in visit on 08/27/18  Lipid panel  Result Value Ref Range   Cholesterol, Total 215 (H) 100 - 199 mg/dL   Triglycerides 181 (H) 0 - 149 mg/dL   HDL 51 >39 mg/dL   VLDL Cholesterol Cal 36 5 - 40 mg/dL   LDL Calculated 128 (H) 0 - 99 mg/dL   Chol/HDL Ratio 4.2 0.0 - 4.4 ratio  Basic metabolic panel  Result Value Ref Range   Glucose 133 (H) 65 - 99 mg/dL   BUN 11 6 - 24 mg/dL   Creatinine, Ser 0.58 0.57 - 1.00  mg/dL   GFR calc non Af Amer 103 >59 mL/min/1.73   GFR calc Af Amer 118 >59 mL/min/1.73   BUN/Creatinine Ratio 19 9 - 23   Sodium 141 134 - 144 mmol/L   Potassium 4.5 3.5 - 5.2 mmol/L   Chloride 104 96 - 106 mmol/L   CO2 24 20 - 29 mmol/L   Calcium 9.5 8.7 - 10.2 mg/dL  POCT glucose (manual entry)  Result Value Ref Range   POC Glucose 147 (A) 70 - 99 mg/dl  POCT glycosylated hemoglobin (Hb A1C)  Result Value Ref Range   Hemoglobin A1C     HbA1c POC (<> result, manual entry)     HbA1c, POC (prediabetic range)     HbA1c, POC (controlled diabetic range) 7.6 (A) 0.0 - 7.0 %     Assessment and Plan: 1. Type 2 diabetes mellitus without complication, without  long-term current use of insulin ([place]) Patient will come to the lab to have A1c done. Continue Metformin and Januvia. Encouraged her to continue healthy eating habits and regular exercise - Hemoglobin A1c; Future  2. Hyperlipidemia associated with type 2 diabetes mellitus ([facility]) Continue Pravachol  3. Need for influenza vaccination When patient come to the lab she will get her flu shot at the same time   Follow Up Instructions: 6-7 wks for pap   I discussed the assessment and treatment plan with the patient. The patient was provided an opportunity to ask questions and all were answered. The patient agreed with the plan and demonstrated an understanding of the instructions.   The patient was advised to call back or seek an in-person evaluation if the symptoms worsen or if the condition fails to improve as anticipated.  I provided 14 minutes of non-face-to-face time during this encounter.   Karle Plumber, MD

## 2019-01-17 NOTE — Progress Notes (Signed)
Pt states her blood sugar this morning was 157  

## 2019-01-21 ENCOUNTER — Ambulatory Visit: Payer: No Typology Code available for payment source | Admitting: Pharmacist

## 2019-01-22 ENCOUNTER — Ambulatory Visit: Payer: Self-pay | Attending: Internal Medicine

## 2019-01-22 ENCOUNTER — Ambulatory Visit (HOSPITAL_BASED_OUTPATIENT_CLINIC_OR_DEPARTMENT_OTHER): Payer: Self-pay | Admitting: Pharmacist

## 2019-01-22 ENCOUNTER — Other Ambulatory Visit: Payer: Self-pay

## 2019-01-22 DIAGNOSIS — E119 Type 2 diabetes mellitus without complications: Secondary | ICD-10-CM

## 2019-01-22 DIAGNOSIS — Z23 Encounter for immunization: Secondary | ICD-10-CM

## 2019-01-22 NOTE — Progress Notes (Signed)
Patient presents for vaccination against influenza per orders of Dr. Johnson. Consent given. Counseling provided. No contraindications exists. Vaccine administered without incident.   

## 2019-01-23 ENCOUNTER — Other Ambulatory Visit: Payer: Self-pay | Admitting: Internal Medicine

## 2019-01-23 LAB — HEMOGLOBIN A1C
Est. average glucose Bld gHb Est-mCnc: 189 mg/dL
Hgb A1c MFr Bld: 8.2 % — ABNORMAL HIGH (ref 4.8–5.6)

## 2019-01-23 MED ORDER — METFORMIN HCL 500 MG PO TABS: 1000.0000 mg | ORAL_TABLET | Freq: Two times a day (BID) | ORAL | 6 refills | Status: DC

## 2019-01-23 MED FILL — metFORMIN HCL 500 MG TABS: 500 | 30 days supply | Qty: 120 | Fill #0

## 2019-01-25 ENCOUNTER — Telehealth: Payer: Self-pay

## 2019-01-25 NOTE — Telephone Encounter (Signed)
[employer] Judeth Cornfield  Id#  [id number] contacted pt to go over lab results pt didn't answer lvm asking pt to give me a call back at her earliest convenience

## 2019-02-20 MED FILL — PRAVASTATIN NA 40 MG TAB: 40 | 30 days supply | Qty: 30 | Fill #8

## 2019-04-02 ENCOUNTER — Ambulatory Visit: Payer: No Typology Code available for payment source | Admitting: Internal Medicine

## 2019-04-03 ENCOUNTER — Other Ambulatory Visit: Payer: Self-pay | Admitting: Internal Medicine

## 2019-04-03 DIAGNOSIS — E785 Hyperlipidemia, unspecified: Secondary | ICD-10-CM

## 2019-04-03 MED FILL — metFORMIN HCL 500 MG TABS: 500 | 30 days supply | Qty: 120 | Fill #0

## 2019-04-04 MED FILL — PRAVASTATIN NA 40 MG TAB: 40 | 30 days supply | Qty: 30 | Fill #0

## 2019-05-08 ENCOUNTER — Other Ambulatory Visit: Payer: Self-pay

## 2019-05-08 ENCOUNTER — Ambulatory Visit: Payer: Self-pay

## 2019-05-08 MED FILL — JANUVIA 50 MG TABLET: 50 | 30 days supply | Qty: 30 | Fill #0

## 2019-05-09 ENCOUNTER — Ambulatory Visit: Payer: Self-pay | Admitting: Internal Medicine

## 2019-06-06 ENCOUNTER — Ambulatory Visit: Payer: Self-pay | Attending: Internal Medicine | Admitting: Internal Medicine

## 2019-06-06 ENCOUNTER — Other Ambulatory Visit: Payer: Self-pay

## 2019-06-06 ENCOUNTER — Other Ambulatory Visit: Payer: Self-pay | Admitting: Internal Medicine

## 2019-06-06 ENCOUNTER — Encounter: Payer: Self-pay | Admitting: Internal Medicine

## 2019-06-06 VITALS — BP 122/77 | HR 72 | Temp 97.5°F | Resp 16 | Wt 198.4 lb

## 2019-06-06 DIAGNOSIS — E1169 Type 2 diabetes mellitus with other specified complication: Secondary | ICD-10-CM

## 2019-06-06 DIAGNOSIS — Z7189 Other specified counseling: Secondary | ICD-10-CM

## 2019-06-06 DIAGNOSIS — E785 Hyperlipidemia, unspecified: Secondary | ICD-10-CM

## 2019-06-06 DIAGNOSIS — E669 Obesity, unspecified: Secondary | ICD-10-CM

## 2019-06-06 DIAGNOSIS — Z124 Encounter for screening for malignant neoplasm of cervix: Secondary | ICD-10-CM

## 2019-06-06 LAB — POCT GLYCOSYLATED HEMOGLOBIN (HGB A1C): HbA1c, POC (controlled diabetic range): 8.7 % — AB (ref 0.0–7.0)

## 2019-06-06 LAB — GLUCOSE, POCT (MANUAL RESULT ENTRY): POC Glucose: 286 mg/dl — AB (ref 70–99)

## 2019-06-06 MED ORDER — SITAGLIPTIN PHOSPHATE 100 MG PO TABS: 100.0000 mg | ORAL_TABLET | Freq: Every day | ORAL | 6 refills | Status: DC

## 2019-06-06 MED ORDER — METFORMIN HCL 1000 MG PO TABS: 1000.0000 mg | ORAL_TABLET | Freq: Two times a day (BID) | ORAL | 6 refills | Status: DC

## 2019-06-06 MED FILL — JANUVIA 100 MG TABLET: 100 | 30 days supply | Qty: 30 | Fill #0

## 2019-06-06 MED FILL — metFORMIN HCL 1000 MG TABS: 1000 | 30 days supply | Qty: 60 | Fill #0

## 2019-06-06 NOTE — Progress Notes (Signed)
Patient ID: [name] [name], female    DOB: 06/11/1960  MRN: [id number]  CC: Gynecologic Exam   Subjective: [name] [name] is a 59 y.o. female who presents for chronic ds management Her concerns today include:  Pt with hx of DM type 2, obesity, NAFLD, HL, GERD.  HM:  Did not get COVID vaccine as yet but plans to get it.  No eye exam as yet  Pt is G3P3 No abn paps in past Pt is post menopausal.  No vaginal dischg or itching.  Sexually active with one partner. No fhx of breast, uterine or ovarian CA Up to date with [facility].  DIABETES TYPE 2 Last A1C:   Results for orders placed or performed in visit on 06/06/19  POCT glucose (manual entry)  Result Value Ref Range   POC Glucose 286 (A) 70 - 99 mg/dl  POCT glycosylated hemoglobin (Hb A1C)  Result Value Ref Range   Hemoglobin A1C     HbA1c POC (<> result, manual entry)     HbA1c, POC (prediabetic range)     HbA1c, POC (controlled diabetic range) 8.7 (A) 0.0 - 7.0 %    Med Adherence:  [x] Yes-on Metformin and Januvia   [] No Medication side effects:  [] Yes    [x] No Home Monitoring?  [x] Yes but not often    [] No Home glucose results range: Diet Adherence: [] Yes    [x] No - not that good Exercise: [x] Yes  Walking 3 x a wk Hypoglycemic episodes?: [] Yes    [x] No Numbness of the feet? [] Yes    [x] No Retinopathy hx? [] Yes    [] No Last eye exam: over due.  No insurance Comments:  Takes Pravachol daily   Patient Active Problem List   Diagnosis Date Noted  . Hyperlipidemia associated with type 2 diabetes mellitus ([place]) 06/06/2019  . Screening breast examination 09/27/2018  . Obesity (BMI 30-39.9) 09/29/2016  . NAFL (nonalcoholic fatty liver) 69/48/5462  . Irritant contact dermatitis due to other chemical products 05/20/2016  . Diabetes type 2, controlled ([place]) 10/21/2014  . Hyperlipidemia 11/04/2013  . Environmental allergies 07/12/2013  . Bell's palsy 11/06/2012  . GERD 11/04/2005      Current Outpatient Medications on File Prior to Visit  Medication Sig Dispense Refill  . aspirin 325 MG tablet Take 1 tablet (325 mg total) by mouth daily. (Patient not taking: Reported on 09/27/2018) 30 tablet 0  . Blood Glucose Monitoring Suppl (TRUE METRIX METER) w/Device KIT Use as directed 1 kit 0  . cetirizine (ZYRTEC) 10 MG tablet Take 1 tablet (10 mg total) by mouth 2 (two) times daily. 60 tablet 5  . glucose blood (TRUE METRIX BLOOD GLUCOSE TEST) test strip Use as instructed 100 each 12  . ketotifen (ZADITOR) 0.025 % ophthalmic solution Place 1 drop into the right eye 2 (two) times daily. 10 mL 1  . omeprazole (PRILOSEC) 20 MG capsule TAKE 1 CAPSULE BY MOUTH DAILY. 30 capsule 5  . pravastatin (PRAVACHOL) 40 MG tablet Take 1 tablet (40 mg total) by mouth daily. Must have office visit for refills 30 tablet 2  . TRUEPLUS LANCETS 28G MISC Use as directed 100 each 1   No current facility-administered medications on file prior to visit.    No Known Allergies  Social History   Socioeconomic History  . Marital status: Married    Spouse name: Not on file  . Number of children: 3  . Years  of education: Not on file  . Highest education level: 9th grade  Occupational History  . Not on file  Tobacco Use  . Smoking status: Never Smoker  . Smokeless tobacco: Never Used  Substance and Sexual Activity  . Alcohol use: No  . Drug use: No  . Sexual activity: Yes    Birth control/protection: None  Other Topics Concern  . Not on file  Social History Narrative  . Not on file   Social Determinants of Health   Financial Resource Strain:   . Difficulty of Paying Living Expenses:   Food Insecurity:   . Worried About Charity fundraiser in the Last Year:   . Arboriculturist in the Last Year:   Transportation Needs: No Transportation Needs  . Lack of Transportation (Medical): No  . Lack of Transportation (Non-Medical): No  Physical Activity:   . Days of Exercise per Week:   .  Minutes of Exercise per Session:   Stress:   . Feeling of Stress :   Social Connections:   . Frequency of Communication with Friends and Family:   . Frequency of Social Gatherings with Friends and Family:   . Attends Religious Services:   . Active Member of Clubs or Organizations:   . Attends Archivist Meetings:   Marland Kitchen Marital Status:   Intimate Partner Violence:   . Fear of Current or Ex-Partner:   . Emotionally Abused:   Marland Kitchen Physically Abused:   . Sexually Abused:     Family History  Problem Relation Age of Onset  . Hypertension Mother   . Diabetes Mother   . Diabetes Father   . Colon cancer Neg Hx     Past Surgical History:  Procedure Laterality Date  . TUBAL LIGATION  1995    ROS: Review of Systems Negative except as stated above  PHYSICAL EXAM: BP 122/77   Pulse 72   Temp (!) 97.5 F (36.4 C)   Resp 16   Wt 198 lb 6.4 oz (90 kg)   SpO2 97%   BMI 36.29 kg/m   Wt Readings from Last 3 Encounters:  06/06/19 198 lb 6.4 oz (90 kg)  09/27/18 201 lb (91.2 kg)  08/27/18 200 lb 12.8 oz (91.1 kg)    Physical Exam  General appearance - alert, well appearing, and in no distress Mental status - normal mood, behavior, speech, dress, motor activity, and thought processes Chest - clear to auscultation, no wheezes, rales or rhonchi, symmetric air entry Heart - normal rate, regular rhythm, normal S1, S2, no murmurs, rubs, clicks or gallops Breasts -RN Marland Kitchen present for both breast and pelvic exam.  Breasts appear normal, no suspicious masses, no skin or nipple changes or axillary nodes Pelvic - normal external genitalia, vulva, vagina, cervix, uterus and adnexa Extremities - peripheral pulses normal, no pedal edema, no clubbing or cyanosis Diabetic Foot Exam - Simple   Simple Foot Form Visual Inspection No deformities, no ulcerations, no other skin breakdown bilaterally: Yes Sensation Testing Intact to touch and monofilament testing bilaterally: Yes Pulse  Check Posterior Tibialis and Dorsalis pulse intact bilaterally: Yes Comments     Results for orders placed or performed in visit on 06/06/19  POCT glucose (manual entry)  Result Value Ref Range   POC Glucose 286 (A) 70 - 99 mg/dl  POCT glycosylated hemoglobin (Hb A1C)  Result Value Ref Range   Hemoglobin A1C     HbA1c POC (<> result, manual entry)  HbA1c, POC (prediabetic range)     HbA1c, POC (controlled diabetic range) 8.7 (A) 0.0 - 7.0 %    CMP Latest Ref Rng & Units 08/27/2018 03/30/2018 03/30/2017  Glucose 65 - 99 mg/dL 133(H) - 115(H)  BUN 6 - 24 mg/dL 11 - 8  Creatinine 0.57 - 1.00 mg/dL 0.58 - 0.58  Sodium 134 - 144 mmol/L 141 - 141  Potassium 3.5 - 5.2 mmol/L 4.5 - 4.1  Chloride 96 - 106 mmol/L 104 - 102  CO2 20 - 29 mmol/L 24 - 26  Calcium 8.7 - 10.2 mg/dL 9.5 - 9.7  Total Protein 6.0 - 8.5 g/dL - 7.3 7.2  Total Bilirubin 0.0 - 1.2 mg/dL - 0.6 0.6  Alkaline Phos 39 - 117 IU/L - 105 98  AST 0 - 40 IU/L - 21 32  ALT 0 - 32 IU/L - 29 43(H)   Lipid Panel     Component Value Date/Time   CHOL 215 (H) 08/27/2018 1041   TRIG 181 (H) 08/27/2018 1041   HDL 51 08/27/2018 1041   CHOLHDL 4.2 08/27/2018 1041   CHOLHDL 4.0 06/04/2014 1055   VLDL 40 06/04/2014 1055   LDLCALC 128 (H) 08/27/2018 1041    CBC    Component Value Date/Time   WBC 5.9 03/30/2018 1042   WBC 6.2 07/12/2013 1253   RBC 4.78 03/30/2018 1042   RBC 4.72 07/12/2013 1253   HGB 14.0 03/30/2018 1042   HCT 41.2 03/30/2018 1042   PLT 282 03/30/2018 1042   MCV 86 03/30/2018 1042   MCH 29.3 03/30/2018 1042   MCH 28.8 07/12/2013 1253   MCHC 34.0 03/30/2018 1042   MCHC 34.6 07/12/2013 1253   RDW 12.7 03/30/2018 1042   LYMPHSABS 2.3 07/12/2013 1253   MONOABS 0.6 07/12/2013 1253   EOSABS 0.2 07/12/2013 1253   BASOSABS 0.0 07/12/2013 1253    ASSESSMENT AND PLAN:  1. Pap smear for cervical cancer screening - Cytology - PAP - Cervicovaginal ancillary only  2. Diabetes mellitus type 2 in obese  (HCC) Not at goal.  Continue Metformin 1000 mg daily.  Increase Januvia to 100 mg daily. Healthy eating habits discussed and encouraged.  Continue regular exercise. - Microalbumin / creatinine urine ratio - metFORMIN (GLUCOPHAGE) 1000 MG tablet; Take 1 tablet (1,000 mg total) by mouth 2 (two) times daily with a meal.  Dispense: 60 tablet; Refill: 6 - CBC - Comprehensive metabolic panel - Lipid panel - sitaGLIPtin (JANUVIA) 100 MG tablet; Take 1 tablet (100 mg total) by mouth daily.  Dispense: 30 tablet; Refill: 6 - POCT glucose (manual entry) - POCT glycosylated hemoglobin (Hb A1C)  3. Hyperlipidemia associated with type 2 diabetes mellitus (HCC) Continue Pravachol  4. Educated about COVID-19 virus infection Discussed and encouraged her to get the COVID-19 vaccines.  Inform of sites where she can have it done.  Patient was given the opportunity to ask questions.  Patient verbalized understanding of the plan and was able to repeat key elements of the plan.  [employer] interpreter used during this encounter. #[id number]   Requested Prescriptions   Signed Prescriptions Disp Refills  . metFORMIN (GLUCOPHAGE) 1000 MG tablet 60 tablet 6    Sig: Take 1 tablet (1,000 mg total) by mouth 2 (two) times daily with a meal.  . sitaGLIPtin (JANUVIA) 100 MG tablet 30 tablet 6    Sig: Take 1 tablet (100 mg total) by mouth daily.    No follow-ups on file.  Karle Plumber, MD, FACP

## 2019-06-06 NOTE — Patient Instructions (Signed)
Diabetes mellitus y nutricin, en adultos Diabetes Mellitus and Nutrition, Adult Si sufre de diabetes (diabetes mellitus), es muy importante tener hbitos alimenticios saludables debido a que sus niveles de azcar en la sangre (glucosa) se ven afectados en gran medida por lo que come y bebe. Comer alimentos saludables en las cantidades adecuadas, aproximadamente a la misma hora todos los das, lo ayudar a:  Controlar la glucemia.  Disminuir el riesgo de sufrir una enfermedad cardaca.  Mejorar la presin arterial.  Alcanzar o mantener un peso saludable. Todas las personas que sufren de diabetes son diferentes y cada una tiene necesidades diferentes en cuanto a un plan de alimentacin. El mdico puede recomendarle que trabaje con un especialista en dietas y nutricin (nutricionista) para elaborar el mejor plan para usted. Su plan de alimentacin puede variar segn factores como:  Las caloras que necesita.  Los medicamentos que toma.  Su peso.  Sus niveles de glucemia, presin arterial y colesterol.  Su nivel de actividad.  Otras afecciones que tenga, como enfermedades cardacas o renales. Cmo me afectan los carbohidratos? Los carbohidratos, o hidratos de carbono, afectan su nivel de glucemia ms que cualquier otro tipo de alimento. La ingesta de carbohidratos naturalmente aumenta la cantidad de glucosa en la sangre. El recuento de carbohidratos es un mtodo destinado a llevar un registro de la cantidad de carbohidratos que se consumen. El recuento de carbohidratos es importante para mantener la glucemia a un nivel saludable, especialmente si utiliza insulina o toma determinados medicamentos por va oral para la diabetes. Es importante conocer la cantidad de carbohidratos que se pueden ingerir en cada comida sin correr ningn riesgo. Esto es diferente en cada persona. Su nutricionista puede ayudarlo a calcular la cantidad de carbohidratos que debe ingerir en cada comida y en cada  refrigerio. Entre los alimentos que contienen carbohidratos, se incluyen:  Pan, cereal, arroz, pastas y galletas.  Papas y maz.  Guisantes, frijoles y lentejas.  Leche y yogur.  Frutas y jugo.  Postres, como pasteles, galletas, helado y caramelos. Cmo me afecta el alcohol? El alcohol puede provocar disminuciones sbitas de la glucemia (hipoglucemia), especialmente si utiliza insulina o toma determinados medicamentos por va oral para la diabetes. La hipoglucemia es una afeccin potencialmente mortal. Los sntomas de la hipoglucemia (somnolencia, mareos y confusin) son similares a los sntomas de haber consumido demasiado alcohol. Si el mdico afirma que el alcohol es seguro para usted, siga estas pautas:  Limite el consumo de alcohol a no ms de 1medida por da si es mujer y no est embarazada, y a 2medidas si es hombre. Una medida equivale a 12oz (355ml) de cerveza, 5oz (148ml) de vino o 1oz (44ml) de bebidas alcohlicas de alta graduacin.  No beba con el estmago vaco.  Mantngase hidratado bebiendo agua, refrescos dietticos o t helado sin azcar.  Tenga en cuenta que los refrescos comunes, los jugos y otras bebida para mezclar pueden contener mucha azcar y se deben contar como carbohidratos. Cules son algunos consejos para seguir este plan?  Leer las etiquetas de los alimentos  Comience por leer el tamao de la porcin en la "Informacin nutricional" en las etiquetas de los alimentos envasados y las bebidas. La cantidad de caloras, carbohidratos, grasas y otros nutrientes mencionados en la etiqueta se basan en una porcin del alimento. Muchos alimentos contienen ms de una porcin por envase.  Verifique la cantidad total de gramos (g) de carbohidratos totales en una porcin. Puede calcular la cantidad de porciones de carbohidratos al dividir el   total de carbohidratos por 15. Por ejemplo, si un alimento tiene un total de 30g de carbohidratos, equivale a 2  porciones de carbohidratos.  Verifique la cantidad de gramos (g) de grasas saturadas y grasas trans en una porcin. Escoja alimentos que no contengan grasa o que tengan un bajo contenido.  Verifique la cantidad de miligramos (mg) de sal (sodio) en una porcin. La mayora de las personas deben limitar la ingesta de sodio total a menos de 2300mg por da.  Siempre consulte la informacin nutricional de los alimentos etiquetados como "con bajo contenido de grasa" o "sin grasa". Estos alimentos pueden tener un mayor contenido de azcar agregada o carbohidratos refinados, y deben evitarse.  Hable con su nutricionista para identificar sus objetivos diarios en cuanto a los nutrientes mencionados en la etiqueta. Al ir de compras  Evite comprar alimentos procesados, enlatados o precocinados. Estos alimentos tienden a tener una mayor cantidad de grasa, sodio y azcar agregada.  Compre en la zona exterior de la tienda de comestibles. Esta zona incluye frutas y verduras frescas, granos a granel, carnes frescas y productos lcteos frescos. Al cocinar  Utilice mtodos de coccin a baja temperatura, como hornear, en lugar de mtodos de coccin a alta temperatura, como frer en abundante aceite.  Cocine con aceites saludables, como el aceite de oliva, canola o girasol.  Evite cocinar con manteca, crema o carnes con alto contenido de grasa. Planificacin de las comidas  Coma las comidas y los refrigerios regularmente, preferentemente a la misma hora todos los das. Evite pasar largos perodos de tiempo sin comer.  Consuma alimentos ricos en fibra, como frutas frescas, verduras, frijoles y cereales integrales. Consulte a su nutricionista sobre cuntas porciones de carbohidratos puede consumir en cada comida.  Consuma entre 4 y 6 onzas (oz) de protenas magras por da, como carnes magras, pollo, pescado, huevos o tofu. Una onza de protena magra equivale a: ? 1 onza de carne, pollo o  pescado. ? 1huevo. ?  taza de tofu.  Coma algunos alimentos por da que contengan grasas saludables, como aguacates, frutos secos, semillas y pescado. Estilo de vida  Controle su nivel de glucemia con regularidad.  Haga actividad fsica habitualmente como se lo haya indicado el mdico. Esto puede incluir lo siguiente: ? 150minutos semanales de ejercicio de intensidad moderada o alta. Esto podra incluir caminatas dinmicas, ciclismo o gimnasia acutica. ? Realizar ejercicios de elongacin y de fortalecimiento, como yoga o levantamiento de pesas, por lo menos 2veces por semana.  Tome los medicamentos como se lo haya indicado el mdico.  No consuma ningn producto que contenga nicotina o tabaco, como cigarrillos y cigarrillos electrnicos. Si necesita ayuda para dejar de fumar, consulte al mdico.  Trabaje con un asesor o instructor en diabetes para identificar estrategias para controlar el estrs y cualquier desafo emocional y social. Preguntas para hacerle al mdico  Es necesario que consulte a un instructor en el cuidado de la diabetes?  Es necesario que me rena con un nutricionista?  A qu nmero puedo llamar si tengo preguntas?  Cules son los mejores momentos para controlar la glucemia? Dnde encontrar ms informacin:  Asociacin Estadounidense de la Diabetes (American Diabetes Association): diabetes.org  Academia de Nutricin y Diettica (Academy of Nutrition and Dietetics): www.eatright.org  Instituto Nacional de la Diabetes y las Enfermedades Digestivas y Renales (National Institute of Diabetes and Digestive and Kidney Diseases, NIH): www.niddk.nih.gov Resumen  Un plan de alimentacin saludable lo ayudar a controlar la glucemia y mantener un estilo de vida saludable.    Trabajar con un especialista en dietas y nutricin (nutricionista) puede ayudarlo a elaborar el mejor plan de alimentacin para usted.  Tenga en cuenta que los carbohidratos (hidratos de  carbono) y el alcohol tienen efectos inmediatos en sus niveles de glucemia. Es importante contar los carbohidratos que ingiere y consumir alcohol con prudencia. Esta informacin no tiene como fin reemplazar el consejo del mdico. Asegrese de hacerle al mdico cualquier pregunta que tenga. Document Revised: 09/13/2016 Document Reviewed: 04/25/2016 Elsevier Patient Education  2020 Elsevier Inc.  

## 2019-06-07 LAB — CBC
Hematocrit: 42.9 % (ref 34.0–46.6)
Hemoglobin: 14.4 g/dL (ref 11.1–15.9)
MCH: 29.5 pg (ref 26.6–33.0)
MCHC: 33.6 g/dL (ref 31.5–35.7)
MCV: 88 fL (ref 79–97)
Platelets: 263 10*3/uL (ref 150–450)
RBC: 4.88 x10E6/uL (ref 3.77–5.28)
RDW: 13.1 % (ref 11.7–15.4)
WBC: 5.6 10*3/uL (ref 3.4–10.8)

## 2019-06-07 LAB — CERVICOVAGINAL ANCILLARY ONLY
Bacterial Vaginitis (gardnerella): NEGATIVE
Candida Glabrata: NEGATIVE
Candida Vaginitis: NEGATIVE
Chlamydia: NEGATIVE
Comment: NEGATIVE
Comment: NEGATIVE
Comment: NEGATIVE
Comment: NEGATIVE
Comment: NEGATIVE
Comment: NORMAL
Neisseria Gonorrhea: NEGATIVE
Trichomonas: NEGATIVE

## 2019-06-07 LAB — LIPID PANEL
Chol/HDL Ratio: 4.4 ratio (ref 0.0–4.4)
Cholesterol, Total: 216 mg/dL — ABNORMAL HIGH (ref 100–199)
HDL: 49 mg/dL (ref >39)
LDL Chol Calc (NIH): 126 mg/dL — ABNORMAL HIGH (ref 0–99)
Triglycerides: 233 mg/dL — ABNORMAL HIGH (ref 0–149)
VLDL Cholesterol Cal: 41 mg/dL — ABNORMAL HIGH (ref 5–40)

## 2019-06-07 LAB — COMPREHENSIVE METABOLIC PANEL
ALT: 42 IU/L — ABNORMAL HIGH (ref 0–32)
AST: 35 IU/L (ref 0–40)
Albumin/Globulin Ratio: 1.9 (ref 1.2–2.2)
Albumin: 4.7 g/dL (ref 3.8–4.9)
Alkaline Phosphatase: 108 IU/L (ref 48–121)
BUN/Creatinine Ratio: 17 (ref 9–23)
BUN: 11 mg/dL (ref 6–24)
Bilirubin Total: 0.7 mg/dL (ref 0.0–1.2)
CO2: 23 mmol/L (ref 20–29)
Calcium: 9.6 mg/dL (ref 8.7–10.2)
Chloride: 98 mmol/L (ref 96–106)
Creatinine, Ser: 0.64 mg/dL (ref 0.57–1.00)
GFR calc Af Amer: 114 mL/min/{1.73_m2} (ref >59)
GFR calc non Af Amer: 99 mL/min/{1.73_m2} (ref >59)
Globulin, Total: 2.5 g/dL (ref 1.5–4.5)
Glucose: 294 mg/dL — ABNORMAL HIGH (ref 65–99)
Potassium: 4 mmol/L (ref 3.5–5.2)
Sodium: 139 mmol/L (ref 134–144)
Total Protein: 7.2 g/dL (ref 6.0–8.5)

## 2019-06-07 LAB — MICROALBUMIN / CREATININE URINE RATIO
Creatinine, Urine: 76.6 mg/dL
Microalb/Creat Ratio: <4 mg/g creat (ref 0–29)
Microalbumin, Urine: <3 ug/mL

## 2019-06-08 ENCOUNTER — Other Ambulatory Visit: Payer: Self-pay | Admitting: Internal Medicine

## 2019-06-08 MED ORDER — ATORVASTATIN CALCIUM 40 MG PO TABS: 40.0000 mg | ORAL_TABLET | Freq: Every day | ORAL | 3 refills | Status: DC

## 2019-06-08 NOTE — Progress Notes (Signed)
Let patient know that her blood count is normal meaning no anemia.  Kidney function is normal.  She has mild elevation in one of her liver enzymes which we will monitor for now.  Her total and LDL cholesterol are not at goal.  I recommend changing the Pravachol to a different cholesterol medication called atorvastatin which should work better.  I have sent that prescription to our pharmacy.  Screening test for chlamydia, gonorrhea and trichomonas were negative.

## 2019-06-10 ENCOUNTER — Telehealth: Payer: Self-pay

## 2019-06-10 LAB — CYTOLOGY - PAP
Comment: NEGATIVE
Diagnosis: NEGATIVE
High risk HPV: NEGATIVE

## 2019-06-10 MED FILL — ATORVASTATIN CALCIUM 40 MG: 40 | 30 days supply | Qty: 30 | Fill #0

## 2019-06-10 NOTE — Telephone Encounter (Signed)
-----   Message from Marcine Matar, MD sent at 06/08/2019  9:46 AM EDT ----- Let patient know that her blood count is normal meaning no anemia.  Kidney function is normal.  She has mild elevation in one of her liver enzymes which we will monitor for now.  Her total and LDL cholesterol are not at goal.  I recommend changing the Pravachol to a different cholesterol medication called atorvastatin which should work better.  I have sent that prescription to our pharmacy.  Screening test for chlamydia, gonorrhea and trichomonas were negative.

## 2019-06-10 NOTE — Telephone Encounter (Signed)
[employer] interpre[employer] Sue Lush  Id#  [id number] contacted pt to go over lab/pap results pt didn't answer lvm asking pt to give me a call at her earliest convenience

## 2019-07-15 MED FILL — ATORVASTATIN CALCIUM 40 MG: 40 | 30 days supply | Qty: 30 | Fill #1

## 2019-07-23 MED FILL — OMEPRAZOLE 20 MG CAP: 20 | 30 days supply | Qty: 30 | Fill #1

## 2019-07-23 MED FILL — JANUVIA 100 MG TABLET: 100 | 30 days supply | Qty: 30 | Fill #1

## 2019-08-14 MED FILL — ATORVASTATIN CALCIUM 40 MG: 40 | 30 days supply | Qty: 30 | Fill #2

## 2019-08-26 MED FILL — metFORMIN HCL 1000 MG TABS: 1000 | 30 days supply | Qty: 60 | Fill #1

## 2019-08-26 MED FILL — JANUVIA 100 MG TABLET: 100 | 30 days supply | Qty: 30 | Fill #2

## 2019-09-17 MED FILL — ?ATORVASTATIN 40MG TABLET: 40 | 30 days supply | Qty: 30 | Fill #3

## 2019-09-30 MED FILL — JANUVIA 100 MG TABLET: 100 | 30 days supply | Qty: 30 | Fill #3

## 2019-10-10 ENCOUNTER — Other Ambulatory Visit: Payer: Self-pay | Admitting: Obstetrics and Gynecology

## 2019-10-10 DIAGNOSIS — Z1231 Encounter for screening mammogram for malignant neoplasm of breast: Secondary | ICD-10-CM

## 2019-10-14 MED FILL — METFORMIN HCL 1000 MG TABS: 1000 | 30 days supply | Qty: 60 | Fill #2

## 2019-10-14 MED FILL — ?ATORVASTATIN 40MG TABLET: 40 | 30 days supply | Qty: 30 | Fill #4

## 2019-10-21 ENCOUNTER — Other Ambulatory Visit: Payer: Self-pay | Admitting: Pharmacist

## 2019-10-21 ENCOUNTER — Encounter: Payer: Self-pay | Admitting: Family Medicine

## 2019-10-21 ENCOUNTER — Other Ambulatory Visit: Payer: Self-pay

## 2019-10-21 ENCOUNTER — Ambulatory Visit: Payer: Self-pay | Attending: Family Medicine | Admitting: Family Medicine

## 2019-10-21 VITALS — BP 115/72 | HR 71 | Ht 61.0 in | Wt 196.6 lb

## 2019-10-21 DIAGNOSIS — E119 Type 2 diabetes mellitus without complications: Secondary | ICD-10-CM

## 2019-10-21 DIAGNOSIS — M25561 Pain in right knee: Secondary | ICD-10-CM

## 2019-10-21 DIAGNOSIS — Z758 Other problems related to medical facilities and other health care: Secondary | ICD-10-CM

## 2019-10-21 DIAGNOSIS — Z789 Other specified health status: Secondary | ICD-10-CM

## 2019-10-21 DIAGNOSIS — Z603 Acculturation difficulty: Secondary | ICD-10-CM

## 2019-10-21 MED ORDER — TRUE METRIX BLOOD GLUCOSE TEST VI STRP
ORAL_STRIP | 12 refills | Status: DC
  Filled 2020-04-19: qty 100, 25d supply, fill #0

## 2019-10-21 MED ORDER — DICLOFENAC SODIUM 1 % EX GEL: 4.0000 g | Freq: Four times a day (QID) | CUTANEOUS | 11 refills | Status: DC

## 2019-10-21 MED ORDER — DICLOFENAC SODIUM 1 % EX CREA: TOPICAL_CREAM | CUTANEOUS | 11 refills | Status: DC

## 2019-10-21 MED FILL — TRUE METRIX TEST STRIP: 25 days supply | Qty: 100 | Fill #0

## 2019-10-21 MED FILL — DICLOFENAC SODIUM 1% GEL: 1 | 6 days supply | Qty: 100 | Fill #0

## 2019-10-21 NOTE — Patient Instructions (Signed)
Dolor agudo de [employer] adultos Acute Knee Pain, Adult El dolor agudo de rodilla es repentino y puede deberse a dao, hinchazn o irritacin de los msculos y tejidos que sostienen la rodilla. La lesin puede ser el resultado de:  Una cada.  Una lesin en la rodilla debido a movimientos de rotacin.  Un golpe en la rodilla.  Una infeccin. El dolor agudo de IT sales professional solo con el tiempo y el descanso. De no ser as, su mdico podra indicarle que se haga estudios para hallar la causa del dolor. Estos medicamentos pueden incluir:  Estudios de diagnstico por imgenes, como una radiografa, una resonancia magntica (RM) o una ecografa.  Aspiracin de una articulacin. En [employer], se extrae lquido de la rodilla.  Artroscopia. En [employer], se introduce un tubo iluminado en la rodilla y se proyecta una imagen en una pantalla de televisin.  Biopsia. En [employer], se toma [place] de tejido del organismo y se la estudia con un microscopio. Siga estas indicaciones en su casa: Est atento a cualquier cambio en los sntomas. Tome estas medidas para Best boy. Si tiene una rodillera o un dispositivo ortopdico:   Use la rodillera o el dispositivo ortopdico como se lo haya indicado el mdico. Forensic scientist solamente como se lo haya indicado el mdico.  Afloje la rodillera o el dispositivo ortopdico si los dedos de los pies se le entumecen, siente hormigueos o se le enfran y se tornan de Optician, dispensing.  Mantenga la rodillera o el dispositivo ortopdico limpio.  Si la rodillera o el dispositivo ortopdico no es impermeable: ? No deje que se mojen. ? Cbralo con un envoltorio hermtico cuando tome un bao de inmersin o una ducha. Actividad  Descanse la rodilla.  No haga cosas que le causen dolor o que lo intensifiquen.  Evite las actividades o los ejercicios de alto impacto, como correr, Art therapist soga o hacer saltos de tijera.  Trabaje con un  fisioterapeuta para crear un programa de ejercicios seguros, segn lo recomiende el mdico. Haga ejercicios como se lo haya indicado el fisioterapeuta. Control del dolor, el entumecimiento y la hinchazn   Si se lo indican, aplique hielo sobre la rodilla: ? Ponga el hielo en una bolsa plstica. ? Coloque una [employer] piel y Therapist, nutritional. ? Coloque el hielo durante 90minutos, 2 a 3veces por da.  Si se lo indican, use una venda elstica para ejercer presin (compresin) en la rodilla lesionada. Esto puede controlar la hinchazn, darle sostn y ayudar a Public house manager las [place]. Indicaciones generales  [employer] de venta libre y los recetados solamente como se lo haya indicado el mdico.  Cuando est sentado o acostado, levante (eleve) la rodilla por encima del nivel del corazn.  Duerma con una almohada debajo de la rodilla.  No consuma ningn producto que contenga nicotina o tabaco, como cigarrillos, cigarrillos electrnicos y tabaco de Higher education careers adviser. Estos pueden retrasar la recuperacin. Si necesita ayuda para dejar de consumir, consulte al [employer].  Si tiene sobrepeso, trabaje con el mdico y un nutricionista para establecer una meta de prdida de peso que sea saludable y razonable para usted. El exceso de peso puede generar presin en la rodilla.  Concurra a todas las visitas de control como se lo haya indicado el mdico. Esto es importante. Comunquese con un mdico si:  El dolor de [place], [place].  Tiene fiebre junto con dolor de rodilla.  La rodilla est caliente al  tacto.  La rodilla se le tuerce o se le traba. Solicite ayuda inmediatamente si:  La rodilla se hincha, y la hinchazn empeora.  No puede mover la rodilla.  Siente un dolor intenso en la rodilla. Resumen  El dolor agudo de rodilla puede deberse a una cada, una lesin, una infeccin o a dao, hinchazn o irritacin de los tejidos que sostienen la rodilla.  El mdico podra  hacerle estudios para hallar la causa del dolor.  Est atento a cualquier cambio en los sntomas. [employer] dolor con descanso, medicamentos, actividad de poca intensidad y [place] de hielo.  Pida ayuda si el dolor contina o empeora, la rodilla se le hincha, o no puede mover la rodilla. Esta informacin no tiene Theme park manager el consejo del mdico. Asegrese de hacerle al mdico cualquier pregunta que tenga. Document Revised: 08/14/2017 Document Reviewed: 08/14/2017 Elsevier Patient Education  2020 [employer].

## 2019-10-21 NOTE — Progress Notes (Signed)
Established Patient Office Visit  Subjective:  Patient ID: [name] [name], female    DOB: 1960-06-16  Age: 59 y.o. MRN: [id number]  CC:  Chief Complaint  Patient presents with  . Knee Pain    right    HPI [name] [name], 59 yo female who is seen due to the complaint of right knee pain for the past 4 weeks. She does not recall an acute injury. She did have a day in which she felt as if her right knee gave away but otherwise no history of injury to her knee. Knee pain is not constant. It occurs when she bends her knee and sometimes when she is lifting her leg to get into bed. No increased pain with walking or using stairs. No swelling of the knee. Also hurts when she gets on her knees. Pain is on the outside of her knee and her leg above the knee. She was not able to give a quality to the pain or a number rating on a pain scale.  Past Medical History:  Diagnosis Date  . Diabetes mellitus without complication ([place])   . Medical history non-contributory     Past Surgical History:  Procedure Laterality Date  . TUBAL LIGATION  1995    Family History  Problem Relation Age of Onset  . Hypertension Mother   . Diabetes Mother   . Diabetes Father   . Colon cancer Neg Hx     Social History   Socioeconomic History  . Marital status: Married    Spouse name: Not on file  . Number of children: 3  . Years of education: Not on file  . Highest education level: 9th grade  Occupational History  . Not on file  Tobacco Use  . Smoking status: Never Smoker  . Smokeless tobacco: Never Used  Vaping Use  . Vaping Use: Never used  Substance and Sexual Activity  . Alcohol use: No  . Drug use: No  . Sexual activity: Yes    Birth control/protection: None  Other Topics Concern  . Not on file  Social History Narrative  . Not on file   Social Determinants of Health   Financial Resource Strain:   . Difficulty of Paying Living Expenses: Not on file  Food Insecurity:     . Worried About Charity fundraiser in the Last Year: Not on file  . Ran Out of Food in the Last Year: Not on file  Transportation Needs:   . Lack of Transportation (Medical): Not on file  . Lack of Transportation (Non-Medical): Not on file  Physical Activity:   . Days of Exercise per Week: Not on file  . Minutes of Exercise per Session: Not on file  Stress:   . Feeling of Stress : Not on file  Social Connections:   . Frequency of Communication with Friends and Family: Not on file  . Frequency of Social Gatherings with Friends and Family: Not on file  . Attends Religious Services: Not on file  . Active Member of Clubs or Organizations: Not on file  . Attends Archivist Meetings: Not on file  . Marital Status: Not on file  Intimate Partner Violence:   . Fear of Current or Ex-Partner: Not on file  . Emotionally Abused: Not on file  . Physically Abused: Not on file  . Sexually Abused: Not on file    Outpatient Medications Prior to Visit  Medication Sig Dispense Refill  . atorvastatin (LIPITOR) 40 MG tablet  Take 1 tablet (40 mg total) by mouth daily. 60 tablet 3  . Blood Glucose Monitoring Suppl (TRUE METRIX METER) w/Device KIT Use as directed 1 kit 0  . cetirizine (ZYRTEC) 10 MG tablet Take 1 tablet (10 mg total) by mouth 2 (two) times daily. 60 tablet 5  . glucose blood (TRUE METRIX BLOOD GLUCOSE TEST) test strip Use as instructed 100 each 12  . ketotifen (ZADITOR) 0.025 % ophthalmic solution Place 1 drop into the right eye 2 (two) times daily. 10 mL 1  . metFORMIN (GLUCOPHAGE) 1000 MG tablet Take 1 tablet (1,000 mg total) by mouth 2 (two) times daily with a meal. 60 tablet 6  . omeprazole (PRILOSEC) 20 MG capsule TAKE 1 CAPSULE BY MOUTH DAILY. 30 capsule 5  . sitaGLIPtin (JANUVIA) 100 MG tablet Take 1 tablet (100 mg total) by mouth daily. 30 tablet 6  . TRUEPLUS LANCETS 28G MISC Use as directed 100 each 1  . aspirin 325 MG tablet Take 1 tablet (325 mg total) by mouth  daily. (Patient not taking: Reported on 09/27/2018) 30 tablet 0   No facility-administered medications prior to visit.    No Known Allergies  ROS Review of Systems  Constitutional: Negative for chills and fever.  Respiratory: Negative for cough and shortness of breath.   Cardiovascular: Negative for chest pain, palpitations and leg swelling.  Gastrointestinal: Negative for abdominal pain and nausea.  Musculoskeletal: Positive for arthralgias. Negative for joint swelling.      Objective:    Physical Exam Vitals and nursing note reviewed.  Constitutional:      General: She is not in acute distress.    Appearance: Normal appearance. She is obese.  Cardiovascular:     Rate and Rhythm: Normal rate and regular rhythm.  Pulmonary:     Effort: Pulmonary effort is normal.     Breath sounds: Normal breath sounds.  Abdominal:     Palpations: Abdomen is soft.     Tenderness: There is no abdominal tenderness. There is no guarding or rebound.  Musculoskeletal:        General: Tenderness (bilateral joint line tenderness of the right knee) present.     Right lower leg: No edema.     Left lower leg: No edema.     Comments: No edema/effusion of the knee; negative Lachmans/drawer test; knee is stable on exam and no pain with varus/valgus stress; left knee normal  Skin:    General: Skin is warm and dry.     Findings: No rash.     Comments: Presence of spider veins on the LE's  Neurological:     Mental Status: She is alert.  Psychiatric:        Mood and Affect: Mood normal.        Behavior: Behavior normal.     BP 115/72 (BP Location: Left Arm, Patient Position: Sitting)   Pulse 71   Ht 5' 1" (1.549 m)   Wt 196 lb 9.6 oz (89.2 kg)   SpO2 98%   BMI 37.15 kg/m  Wt Readings from Last 3 Encounters:  10/21/19 196 lb 9.6 oz (89.2 kg)  06/06/19 198 lb 6.4 oz (90 kg)  09/27/18 201 lb (91.2 kg)     Health Maintenance Due  Topic Date Due  . COVID-19 Vaccine (1) Never done  .  OPHTHALMOLOGY EXAM  04/13/2018  . INFLUENZA VACCINE  08/18/2019     Lab Results  Component Value Date   TSH 1.730 06/16/2016   Lab Results  Component Value Date   WBC 5.6 06/06/2019   HGB 14.4 06/06/2019   HCT 42.9 06/06/2019   MCV 88 06/06/2019   PLT 263 06/06/2019   Lab Results  Component Value Date   NA 139 06/06/2019   K 4.0 06/06/2019   CO2 23 06/06/2019   GLUCOSE 294 (H) 06/06/2019   BUN 11 06/06/2019   CREATININE 0.64 06/06/2019   BILITOT 0.7 06/06/2019   ALKPHOS 108 06/06/2019   AST 35 06/06/2019   ALT 42 (H) 06/06/2019   PROT 7.2 06/06/2019   ALBUMIN 4.7 06/06/2019   CALCIUM 9.6 06/06/2019   Lab Results  Component Value Date   CHOL 216 (H) 06/06/2019   Lab Results  Component Value Date   HDL 49 06/06/2019   Lab Results  Component Value Date   LDLCALC 126 (H) 06/06/2019   Lab Results  Component Value Date   TRIG 233 (H) 06/06/2019   Lab Results  Component Value Date   CHOLHDL 4.4 06/06/2019   Lab Results  Component Value Date   HGBA1C 8.7 (A) 06/06/2019      Assessment & Plan:  1. 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 Educational material in Spanish on acute knee pain provided. Rx sent to pharmacy for diclofenac gel but she is aware that this can also be obtained OTC. May also take otc acetominophen or ibuprofen as needed for knee pain. Obtain x-ray of the knee if pain is not improving or worsens. - DG Knee Complete 4 Views Right; Future - Diclofenac Sodium 1 % CREA; Apply 4 grams to the right knee/leg four times per day as needed for pain  Dispense: 120 g; Refill: 11  2. Controlled type 2 diabetes mellitus without complication, without long-term current use of insulin ([place]) She requests refill of test strips as she has been unable to check her blood sugars. - glucose blood (TRUE METRIX BLOOD GLUCOSE TEST) test strip; Use as instructed  Dispense: 100 each; Refill: 12  3. Language  barrier [employer] video interpretation system used at today's visit.     Follow-up: Return in about 3 weeks (around 11/11/2019) for knee pain if not better; f/u with PCP as scheduled for DM.   Antony Blackbird, MD

## 2019-11-07 MED FILL — JANUVIA 100 MG TABLET: 100 | 30 days supply | Qty: 30 | Fill #4

## 2019-11-18 MED FILL — ?ATORVASTATIN 40MG TABLET: 40 | 30 days supply | Qty: 30 | Fill #5

## 2019-11-28 ENCOUNTER — Ambulatory Visit: Payer: No Typology Code available for payment source | Admitting: *Deleted

## 2019-11-28 ENCOUNTER — Other Ambulatory Visit: Payer: Self-pay

## 2019-11-28 ENCOUNTER — Ambulatory Visit
Admission: RE | Admit: 2019-11-28 | Discharge: 2019-11-28 | Disposition: A | Discharge status: Home / Self Care | Payer: No Typology Code available for payment source | Source: Ambulatory Visit | Attending: Obstetrics and Gynecology | Admitting: Obstetrics and Gynecology

## 2019-11-28 VITALS — BP 138/78 | Wt 196.6 lb

## 2019-11-28 DIAGNOSIS — Z1231 Encounter for screening mammogram for malignant neoplasm of breast: Secondary | ICD-10-CM

## 2019-11-28 DIAGNOSIS — Z1239 Encounter for other screening for malignant neoplasm of breast: Secondary | ICD-10-CM

## 2019-11-28 NOTE — Progress Notes (Signed)
[name] is a 59 y.o. female who presents to [facility] clinic today with no complaints.    Pap Smear: Pap smear not completed today. Last Pap smear was 06/06/2019 at [facility] and was normal with negative HPV. Per patient has no history of an abnormal Pap smear. Last Pap smear result is available in Epic.   Physical exam: Breasts Breasts symmetrical. No skin abnormalities bilateral breasts. No nipple retraction bilateral breasts. No nipple discharge bilateral breasts. No lymphadenopathy. No lumps palpated bilateral breasts. No complaints of pain or tenderness on exam.       Pelvic/Bimanual Pap is not indicated today per BCCCP guidelines.   Smoking History: Patient has never smoked.   Patient Navigation: Patient education provided. Access to services provided for patient through [place] program. Spanish interpreter Natale Lay from [facility] provided.   Colorectal Cancer Screening: Patient has had colonoscopy completed on 02/16/2015. No complaints today.    Breast and Cervical Cancer Risk Assessment: Patient does not have family history of breast cancer, known genetic mutations, or radiation treatment to the chest before age 28. Patient does not have history of cervical dysplasia, immunocompromised, or DES exposure in-utero.  Risk Assessment    Risk Scores      11/28/2019 09/27/2018   Last edited by: Narda Rutherford, LPN Stoney Bang H, LPN   5-year risk: 0.6 % 0.6 %   Lifetime risk: 3.5 % 3.6 %          A: BCCCP exam without pap smear No complaints.  P: Referred patient to the [facility] for a screening mammogram on the mobile unit. Appointment scheduled Thursday, November 28, 2019 at 1000.  Priscille Heidelberg, RN 11/28/2019 9:40 AM

## 2019-11-28 NOTE — Patient Instructions (Signed)
Explained breast self awareness with [name] [name]. Patient did not need a Pap smear today due to last Pap smear and HPV typing was 06/06/2019. Let her know BCCCP will cover Pap smears and HPV typing every 5 years unless has a history of abnormal Pap smears. Referred patient to the [facility] for a screening mammogram on the mobile unit. Appointment scheduled Thursday, November 28, 2019 at 1000. Patient escorted to mobile unit following BCCCP appointment for her screening mammogram. Let patient know the Breast Center will follow up with her within the next couple weeks with results of her mammogram by letter or phone. [name] [name] verbalized understanding.  Graceann Boileau, Kathaleen Maser, RN 9:40 AM

## 2019-12-16 MED FILL — JANUVIA 100 MG TABLET: 100 | 30 days supply | Qty: 30 | Fill #5

## 2019-12-16 MED FILL — METFORMIN HCL 1000 MG TABS: 1000 | 30 days supply | Qty: 60 | Fill #3

## 2019-12-16 MED FILL — ?ATORVASTATIN 40MG TABLET: 40 | 30 days supply | Qty: 30 | Fill #6

## 2019-12-16 MED FILL — DICLOFENAC SODIUM 1% GEL: 1 | 6 days supply | Qty: 100 | Fill #1

## 2020-01-01 ENCOUNTER — Telehealth: Payer: Self-pay | Admitting: Internal Medicine

## 2020-01-01 NOTE — Telephone Encounter (Signed)
Called patient using interpreter services and LVM alerting patient that appointment has been cancelled with Joaquin Courts on 12/22. Advised patient to call back and schedule with another provider or patient can go to mobile clinic.

## 2020-01-07 ENCOUNTER — Ambulatory Visit (HOSPITAL_BASED_OUTPATIENT_CLINIC_OR_DEPARTMENT_OTHER): Payer: Self-pay | Admitting: Internal Medicine

## 2020-01-07 ENCOUNTER — Ambulatory Visit: Payer: Self-pay | Attending: Internal Medicine

## 2020-01-07 ENCOUNTER — Other Ambulatory Visit: Payer: Self-pay

## 2020-01-07 ENCOUNTER — Other Ambulatory Visit: Payer: Self-pay | Admitting: Internal Medicine

## 2020-01-07 ENCOUNTER — Encounter: Payer: Self-pay | Admitting: Internal Medicine

## 2020-01-07 VITALS — BP 121/76 | HR 75 | Resp 16 | Wt 199.0 lb

## 2020-01-07 DIAGNOSIS — E1165 Type 2 diabetes mellitus with hyperglycemia: Secondary | ICD-10-CM

## 2020-01-07 DIAGNOSIS — E119 Type 2 diabetes mellitus without complications: Secondary | ICD-10-CM

## 2020-01-07 DIAGNOSIS — Z23 Encounter for immunization: Secondary | ICD-10-CM

## 2020-01-07 LAB — POCT GLYCOSYLATED HEMOGLOBIN (HGB A1C): HbA1c, POC (controlled diabetic range): 8.5 % — AB (ref 0.0–7.0)

## 2020-01-07 LAB — GLUCOSE, POCT (MANUAL RESULT ENTRY): POC Glucose: 237 mg/dl — AB (ref 70–99)

## 2020-01-07 MED ORDER — MELOXICAM 7.5 MG PO TABS: 7.5000 mg | ORAL_TABLET | Freq: Every day | ORAL | 0 refills | Status: DC

## 2020-01-07 MED ORDER — TRULICITY 0.75 MG/0.5ML ~~LOC~~ SOAJ
0.7500 mg | SUBCUTANEOUS | 1 refills | Status: DC
Start: 2020-01-07 — End: 2020-02-10

## 2020-01-07 MED FILL — MELOXICAM 7.5 MG TABLET: 7.5 | 15 days supply | Qty: 15 | Fill #0

## 2020-01-07 MED FILL — TRULICITY 0.75 MG/0.5 ML PE: 0.75 | 28 days supply | Qty: 2 | Fill #0

## 2020-01-07 NOTE — Assessment & Plan Note (Signed)
Diabetes is not controlled.  She is on Januvia.  Also on Metformin.  I would like to change to a GLP-1 agonist.  We will start Trulicity.  Patient has seen the pharmacist in the clinic today.  She understands how to use the new medication.  She understands have discontinued the Januvia.  She will follow back up with [facility] in 4 weeks.

## 2020-01-07 NOTE — Progress Notes (Signed)
Leg pain- 6/10  Worsens the longer she stands Takes Tylenol  HgA1C- 8.5 CBG- 237  Legs hurt- only when standing. Typically occurs only after standing for a few hours. She has no pain with walking unless the walking occurs after standing for several hours. L>R.  Pain location - thigh (between hip and knee).   Duration of pain ~ one month.  No trauma, no other asscoiated sxs. No redness, no fever.   She has been using topical diclofenac- some relief.    DM- she rarely checks cbgs at home (140 range). Past Medical History:  Diagnosis Date  . Diabetes mellitus without complication ([place])   . Hyperlipidemia   . Medical history non-contributory     Social History   Socioeconomic History  . Marital status: Married    Spouse name: Not on file  . Number of children: 3  . Years of education: Not on file  . Highest education level: 9th grade  Occupational History  . Not on file  Tobacco Use  . Smoking status: Never Smoker  . Smokeless tobacco: Never Used  Vaping Use  . Vaping Use: Never used  Substance and Sexual Activity  . Alcohol use: No  . Drug use: No  . Sexual activity: Yes    Birth control/protection: None, Surgical  Other Topics Concern  . Not on file  Social History Narrative  . Not on file   Social Determinants of Health   Financial Resource Strain: Not on file  Food Insecurity: Not on file  Transportation Needs: No Transportation Needs  . Lack of Transportation (Medical): No  . Lack of Transportation (Non-Medical): No  Physical Activity: Not on file  Stress: Not on file  Social Connections: Not on file  Intimate Partner Violence: Not on file    Past Surgical History:  Procedure Laterality Date  . TUBAL LIGATION  1995    Family History  Problem Relation Age of Onset  . Hypertension Mother   . Diabetes Mother   . Diabetes Father   . Diabetes Sister   . Diabetes Brother   . Diabetes Sister   . Diabetes Sister   . Diabetes Sister   . Colon  cancer Neg Hx     No Known Allergies  Current Outpatient Medications on File Prior to Visit  Medication Sig Dispense Refill  . aspirin 325 MG tablet Take 1 tablet (325 mg total) by mouth daily. 30 tablet 0  . atorvastatin (LIPITOR) 40 MG tablet Take 1 tablet (40 mg total) by mouth daily. 60 tablet 3  . cetirizine (ZYRTEC) 10 MG tablet Take 1 tablet (10 mg total) by mouth 2 (two) times daily. 60 tablet 5  . diclofenac Sodium (VOLTAREN) 1 % GEL Apply 4 g topically 4 (four) times daily. 100 g 11  . ketotifen (ZADITOR) 0.025 % ophthalmic solution Place 1 drop into the right eye 2 (two) times daily. 10 mL 1  . metFORMIN (GLUCOPHAGE) 1000 MG tablet Take 1 tablet (1,000 mg total) by mouth 2 (two) times daily with a meal. 60 tablet 6  . omeprazole (PRILOSEC) 20 MG capsule TAKE 1 CAPSULE BY MOUTH DAILY. 30 capsule 5  . sitaGLIPtin (JANUVIA) 100 MG tablet Take 1 tablet (100 mg total) by mouth daily. 30 tablet 6  . Blood Glucose Monitoring Suppl (TRUE METRIX METER) w/Device KIT Use as directed 1 kit 0  . glucose blood (TRUE METRIX BLOOD GLUCOSE TEST) test strip Use as instructed 100 each 12  . TRUEPLUS LANCETS 28G MISC  Use as directed 100 each 1   No current facility-administered medications on file prior to visit.     patient denies chest pain, shortness of breath, orthopnea. Denies lower extremity edema, abdominal pain, change in appetite, change in bowel movements. Patient denies rashes, musculoskeletal complaints. No other specific complaints in a complete review of systems.   BP 121/76   Pulse 75   Resp 16   Wt 199 lb (90.3 kg)   SpO2 100%   BMI 37.60 kg/m   Well-developed well-nourished female in no acute distress. HEENT exam atraumatic, normocephalic, extraocular muscles are intact. Neck is supple. No jugular venous distention no thyromegaly. Chest clear to auscultation without increased work of breathing. Cardiac exam S1 and S2 are regular. Abdominal exam- overweight.  active bowel  sounds, soft, nontender. Extremities no edema. Neurologic exam she is alert without any motor sensory deficits. Gait is normal.  Uncontrolled type 2 diabetes mellitus ([place]) Diabetes is not controlled.  She is on Januvia.  Also on Metformin.  I would like to change to a GLP-1 agonist.  We will start Trulicity.  Patient has seen the pharmacist in the clinic today.  She understands how to use the new medication.  She understands have discontinued the Januvia.  She will follow back up with [facility] in 4 weeks.   Leg pain: There are no significant symptoms of concern.  I suspect this is mechanical in nature and related to prolonged standing.  I have encouraged her to walk through the day as she thinks that provides relief.  She has been using topical nonsteroidal anti-inflammatory drug.  I will discontinue the diclofenac and will add once daily meloxicam for 15 days.

## 2020-01-07 NOTE — Patient Instructions (Addendum)
Stop [place] Stop voltaren gel  See medication list.    Detener Januvia Dejar de gel voltaren  Consulte la lista de medicamentos.  seguimiento con Franky Macho en 1 mes

## 2020-01-08 ENCOUNTER — Ambulatory Visit: Payer: No Typology Code available for payment source | Admitting: Family Medicine

## 2020-01-20 MED FILL — ?ATORVASTATIN 40MG TABLET: 40 | 30 days supply | Qty: 30 | Fill #7

## 2020-02-10 ENCOUNTER — Encounter: Payer: Self-pay | Admitting: Pharmacist

## 2020-02-10 ENCOUNTER — Ambulatory Visit: Payer: Self-pay | Attending: Internal Medicine | Admitting: Pharmacist

## 2020-02-10 ENCOUNTER — Other Ambulatory Visit: Payer: Self-pay | Admitting: Internal Medicine

## 2020-02-10 ENCOUNTER — Other Ambulatory Visit: Payer: Self-pay

## 2020-02-10 DIAGNOSIS — E1165 Type 2 diabetes mellitus with hyperglycemia: Secondary | ICD-10-CM

## 2020-02-10 LAB — GLUCOSE, POCT (MANUAL RESULT ENTRY): POC Glucose: 207 mg/dl — AB (ref 70–99)

## 2020-02-10 MED ORDER — TRULICITY 1.5 MG/0.5ML ~~LOC~~ SOAJ
1.5000 mg | SUBCUTANEOUS | 2 refills | Status: DC
Start: 2020-02-10 — End: 2020-02-10

## 2020-02-10 MED FILL — ?TRULICITY 1.5 MG/0.5 ML PE: 1.5 | 28 days supply | Qty: 2 | Fill #0

## 2020-02-10 NOTE — Progress Notes (Signed)
S:    PCP: Dr. Laural Benes   No chief complaint on file.  Patient arrives in good spirits. Presents for diabetes evaluation, education, and management.  Patient was referred on 12/21/2021by Dr. Cato Mulligan. At that visit, Januvia was stopped and Trulicity was started.  Patient reports Diabetes was diagnosed ~3 yrs ago.   Family/Social History:   FHx: DM  Tobacco: never smoker   Alcohol: denies use   Insurance coverage/medication affordability: dnbi   Medication adherence reported. Of note, she thinks her Trulicity pen malfunctioned last week.    Current diabetes medications include: metformin 1000 mg BID, Trulicity 0.75 mg weekly  Current hypertension medications include: none  Current hyperlipidemia medications include: atorvastatin 40 mg daily   Patient denies hypoglycemic events.  Patient reported dietary habits:  -Appetite limited since Sat (1/22) as she received her Covid vaccine (reports NV after injection)  - Admits to occasional dietary indiscretion but tries to avoid carbs and sweets  Patient-reported exercise habits:  - None currently  - Walks when it's not too cold outside. In the warmer weather, she walks consistently    Patient denies nocturia (nighttime urination).  Patient denies neuropathy (nerve pain). Patient denies visual changes. Patient reports self foot exams.     O:  POCT: 207  Lab Results  Component Value Date   HGBA1C 8.5 (A) 01/07/2020   There were no vitals filed for this visit.  Lipid Panel     Component Value Date/Time   CHOL 216 (H) 06/06/2019 1112   TRIG 233 (H) 06/06/2019 1112   HDL 49 06/06/2019 1112   CHOLHDL 4.4 06/06/2019 1112   CHOLHDL 4.0 06/04/2014 1055   VLDL 40 06/04/2014 1055   LDLCALC 126 (H) 06/06/2019 1112   Home fasting blood sugars: 117 - 127   2 hour post-meal/random blood sugars: none reported.  Clinical Atherosclerotic Cardiovascular Disease (ASCVD): No  The 10-year ASCVD risk score Denman George DC Jr., et al.,  2013) is: 5.9%   Values used to calculate the score:     Age: 60 years     Sex: Female     Is Non-Hispanic African American: No     Diabetic: Yes     Tobacco smoker: No     Systolic Blood Pressure: 121 mmHg     Is BP treated: No     HDL Cholesterol: 49 mg/dL     Total Cholesterol: 216 mg/dL    A/P: Diabetes longstanding currently uncontrolled but home sugars reveal improvement. Patient is able to verbalize appropriate hypoglycemia management plan. Medication adherence appears optimal and she is tolerating her Trulicity well. -Increased dose of Trulicity to 1.5 mg weekly.  -Pt instructed to skip this week's dose d/t nausea related to her recent covid vaccination.  -Continued metformin 1000 mg BID  -Extensively discussed pathophysiology of diabetes, recommended lifestyle interventions, dietary effects on blood sugar control -Counseled on s/sx of and management of hypoglycemia -Next A1C anticipated 3/22.   ASCVD risk - primary prevention in patient with diabetes. Last LDL is not controlled. ASCVD risk score is not >20%  - Pt changed to atorvastatin 40 mg when last lipid was checked (5/21). Continued high intensity statin. -Continued atorvastatin 40 mg.   Written patient instructions provided.  Total time in face to face counseling 20 minutes.   Follow up Pharmacist Clinic Visit in 1 month.     Patient seen with:  Adam Phenix, PharmD Candidate  [employer] 2024  Butch Penny, PharmD, [place], CPP Clinical  [place] [phone]

## 2020-02-14 MED FILL — METFORMIN HCL 1000 MG TABS: 1000 | 30 days supply | Qty: 60 | Fill #4

## 2020-03-09 ENCOUNTER — Other Ambulatory Visit: Payer: Self-pay | Admitting: Internal Medicine

## 2020-03-09 DIAGNOSIS — K219 Gastro-esophageal reflux disease without esophagitis: Secondary | ICD-10-CM

## 2020-03-09 MED FILL — OMEPRAZOLE 20 MG CAP: 20 | 30 days supply | Qty: 30 | Fill #0

## 2020-03-09 MED FILL — ?ATORVASTATIN 40MG TABLET: 40 | 30 days supply | Qty: 30 | Fill #0

## 2020-03-10 ENCOUNTER — Other Ambulatory Visit: Payer: Self-pay

## 2020-03-10 ENCOUNTER — Encounter: Payer: Self-pay | Admitting: Pharmacist

## 2020-03-10 ENCOUNTER — Ambulatory Visit: Payer: Self-pay | Attending: Internal Medicine | Admitting: Pharmacist

## 2020-03-10 DIAGNOSIS — E1165 Type 2 diabetes mellitus with hyperglycemia: Secondary | ICD-10-CM

## 2020-03-10 LAB — GLUCOSE, POCT (MANUAL RESULT ENTRY): POC Glucose: 119 mg/dl — AB (ref 70–99)

## 2020-03-10 MED FILL — ?TRULICITY 1.5 MG/0.5 ML PE: 1.5 | 28 days supply | Qty: 2 | Fill #1

## 2020-03-10 NOTE — Progress Notes (Signed)
    S:    PCP: Dr. Laural Benes   Patient arrives in good spirits. Presents for diabetes evaluation, education, and management. Patient was referred on 01/07/2020 by Dr. Cato Mulligan. Patient saw pharmacist on 02/10/2020 and at that visit Trulicity was increased.  Today, patient reports medication adherence. She denies BG<70 and >200. She denies GI sx with Trulicity and metformin. Of note she reports sometimes getting an anxious hungry feeling but eats fruit and it resolves. She is suspicious of Trulicity pen malfunctioning.   Family/Social History:   FHx: DM  Tobacco: never smoker   Alcohol: denies use   Insurance coverage/medication affordability: dnbi   Current diabetes medications include: metformin 1000 mg BID, Trulicity 1.5 mg weekly  Current hypertension medications include: none  Current hyperlipidemia medications include: atorvastatin 40 mg daily   Patient denies hypoglycemic events.  Patient reported dietary habits:  - pt reports cutting out bread and limiting tortillas  Patient-reported exercise habits:  - walks every day   Patient denies nocturia (nighttime urination).  Patient denies neuropathy (nerve pain). Patient denies visual changes. Patient reports self foot exams.    O:  POCT: 119  Lab Results  Component Value Date   HGBA1C 8.5 (A) 01/07/2020   There were no vitals filed for this visit.  Lipid Panel     Component Value Date/Time   CHOL 216 (H) 06/06/2019 1112   TRIG 233 (H) 06/06/2019 1112   HDL 49 06/06/2019 1112   CHOLHDL 4.4 06/06/2019 1112   CHOLHDL 4.0 06/04/2014 1055   VLDL 40 06/04/2014 1055   LDLCALC 126 (H) 06/06/2019 1112   Home fasting blood sugars: 111 - 121 2 hour post-meal/random blood sugars: none  - she checks her sugar 1-2x per week  Clinical Atherosclerotic Cardiovascular Disease (ASCVD): No  The 10-year ASCVD risk score Denman George DC Jr., et al., 2013) is: 5.9%   Values used to calculate the score:     Age: 49 years     Sex: Female      Is Non-Hispanic African American: No     Diabetic: Yes     Tobacco smoker: No     Systolic Blood Pressure: 121 mmHg     Is BP treated: No     HDL Cholesterol: 49 mg/dL     Total Cholesterol: 216 mg/dL   A/P: Diabetes longstanding currently uncontrolled but home sugars are at goal. Patient is able to verbalize appropriate hypoglycemia management plan. Medication adherence and regimen appears optimal and she is tolerating her Trulicity well. She reported concern about Trulicity pen malfunctioning, but proper administration technique was verified. Therefore, I suspect the pen is working properly and she just does not feel the injection. -Continue Trulicity to 1.5 mg weekly -Continued metformin 1000 mg BID  -Extensively discussed pathophysiology of diabetes, recommended lifestyle interventions, dietary effects on blood sugar control -Counseled on s/sx of and management of hypoglycemia -Next A1C anticipated 3/22.   ASCVD risk - primary prevention in patient with diabetes. Last LDL is not controlled. ASCVD risk score is not >20%  - Pt changed to atorvastatin 40 mg when last lipid was checked (5/21). Continued high intensity statin. -Continued atorvastatin 40 mg.   Written patient instructions provided.  Total time in face to face counseling 20 minutes.   Follow up PCP visit on 05/05/2020.  Patient seen with:  Adam Phenix, PharmD Candidate  [employer]24  Butch Penny, PharmD, [place], CPP Clinical Pharmacist [facility] [phone]

## 2020-04-08 ENCOUNTER — Ambulatory Visit: Payer: No Typology Code available for payment source | Admitting: Physician Assistant

## 2020-04-17 ENCOUNTER — Other Ambulatory Visit: Payer: Self-pay | Admitting: Internal Medicine

## 2020-04-17 NOTE — Telephone Encounter (Signed)
Will forward to provider  

## 2020-04-17 NOTE — Telephone Encounter (Signed)
Requested medication (s) are due for refill today: na  Requested medication (s) are on the active medication list: yes  Last refill:  01/07/20 #15 0 refills  Future visit scheduled: yes in 2 weeks   Notes to clinic:  do you want to give #30 0 refills?   Requested Prescriptions  Pending Prescriptions Disp Refills   meloxicam (MOBIC) 7.5 MG tablet [Pharmacy Med Name: MELOXICAM 7.5 MG TABLET 7.5 Tablet] 15 tablet 0    Sig: Take 1 tablet (7.5 mg total) by mouth daily.      Analgesics:  COX2 Inhibitors Passed - 04/17/2020  2:27 PM      Passed - HGB in normal range and within 360 days    Hemoglobin  Date Value Ref Range Status  06/06/2019 14.4 11.1 - 15.9 g/dL Final          Passed - Cr in normal range and within 360 days    Creat  Date Value Ref Range Status  12/31/2015 0.58 0.50 - 1.05 mg/dL Final    Comment:      For patients > or = 59 years of age: The upper reference limit for Creatinine is approximately 13% higher for people identified as African-American.      Creatinine, Ser  Date Value Ref Range Status  06/06/2019 0.64 0.57 - 1.00 mg/dL Final   Creatinine, Urine  Date Value Ref Range Status  02/22/2016 36 20 - 320 mg/dL Final          Passed - Patient is not pregnant      Passed - Valid encounter within last 12 months    Recent Outpatient Visits           1 month ago Uncontrolled type 2 diabetes mellitus with hyperglycemia [facility])   [place] [facility] [place], Jeannett Senior L, RPH-CPP   2 months ago Uncontrolled type 2 diabetes mellitus with hyperglycemia [facility])   [place] [facility] [place], Cornelius Moras, RPH-CPP   3 months ago Controlled type 2 diabetes mellitus without complication, without long-term current use of insulin (HCC)   [facility] Swords, Valetta Mole, MD   5 months ago Acute pain of right knee   [facility] Fulp, [place], MD   10 months ago Pap  smear for cervical cancer screening   [facility] Marcine Matar, MD       Future Appointments             In 2 weeks Marcine Matar, MD [facility]

## 2020-04-17 NOTE — Telephone Encounter (Signed)
   Notes to clinic: Patient has appointment on 05/05/2020 Courtesy refill already given  Review for another refill   Requested Prescriptions  Pending Prescriptions Disp Refills   atorvastatin (LIPITOR) 40 MG tablet [Pharmacy Med Name: ATORVASTATIN 40MG  TABLET 40 Tablet] 30 tablet 0    Sig: TAKE 1 TABLET (40 MG TOTAL) BY MOUTH DAILY.      Cardiovascular:  Antilipid - Statins Failed - 04/17/2020  2:27 PM      Failed - Total Cholesterol in normal range and within 360 days    Cholesterol, Total  Date Value Ref Range Status  06/06/2019 216 (H) 100 - 199 mg/dL Final          Failed - LDL in normal range and within 360 days    LDL Chol Calc (NIH)  Date Value Ref Range Status  06/06/2019 126 (H) 0 - 99 mg/dL Final          Failed - Triglycerides in normal range and within 360 days    Triglycerides  Date Value Ref Range Status  06/06/2019 233 (H) 0 - 149 mg/dL Final          Passed - HDL in normal range and within 360 days    HDL  Date Value Ref Range Status  06/06/2019 49 >39 mg/dL Final          Passed - Patient is not pregnant      Passed - Valid encounter within last 12 months    Recent Outpatient Visits           1 month ago Uncontrolled type 2 diabetes mellitus with hyperglycemia [facility])   [place] [facility] [place], KOOMBERKINE L, RPH-CPP   2 months ago Uncontrolled type 2 diabetes mellitus with hyperglycemia [facility])   [facility] [place], KOOMBERKINE, RPH-CPP   3 months ago Controlled type 2 diabetes mellitus without complication, without long-term current use of insulin (HCC)   [facility] Swords, Cornelius Moras, MD   5 months ago Acute pain of right knee   [facility] [place], [place], MD   10 months ago Pap smear for cervical cancer screening   [facility], MD       Future Appointments             In 2 weeks  Marcine Matar, MD [facility]

## 2020-04-19 ENCOUNTER — Other Ambulatory Visit: Payer: Self-pay

## 2020-04-19 MED FILL — Metformin HCl Tab 1000 MG: ORAL | 30 days supply | Qty: 60 | Fill #0 | Status: AC

## 2020-04-20 ENCOUNTER — Other Ambulatory Visit: Payer: Self-pay

## 2020-04-22 ENCOUNTER — Other Ambulatory Visit: Payer: Self-pay | Admitting: Internal Medicine

## 2020-04-22 ENCOUNTER — Other Ambulatory Visit: Payer: Self-pay

## 2020-04-22 MED ORDER — MELOXICAM 7.5 MG PO TABS
7.5000 mg | ORAL_TABLET | Freq: Every day | ORAL | 1 refills | Status: DC
  Filled 2020-04-22: qty 30, 30d supply, fill #0

## 2020-04-23 ENCOUNTER — Other Ambulatory Visit: Payer: Self-pay

## 2020-05-05 ENCOUNTER — Other Ambulatory Visit: Payer: Self-pay

## 2020-05-05 ENCOUNTER — Ambulatory Visit: Payer: Self-pay | Attending: Internal Medicine | Admitting: Internal Medicine

## 2020-05-05 ENCOUNTER — Encounter: Payer: Self-pay | Admitting: Internal Medicine

## 2020-05-05 VITALS — BP 121/77 | HR 66 | Resp 18 | Wt 189.2 lb

## 2020-05-05 DIAGNOSIS — E1165 Type 2 diabetes mellitus with hyperglycemia: Secondary | ICD-10-CM

## 2020-05-05 DIAGNOSIS — E669 Obesity, unspecified: Secondary | ICD-10-CM

## 2020-05-05 DIAGNOSIS — E1169 Type 2 diabetes mellitus with other specified complication: Secondary | ICD-10-CM

## 2020-05-05 DIAGNOSIS — E785 Hyperlipidemia, unspecified: Secondary | ICD-10-CM

## 2020-05-05 LAB — POCT GLYCOSYLATED HEMOGLOBIN (HGB A1C): HbA1c, POC (controlled diabetic range): 6.6 % (ref 0.0–7.0)

## 2020-05-05 LAB — GLUCOSE, POCT (MANUAL RESULT ENTRY): POC Glucose: 156 mg/dl — AB (ref 70–99)

## 2020-05-05 MED ORDER — DULAGLUTIDE 1.5 MG/0.5ML ~~LOC~~ SOAJ
1.5000 mg | SUBCUTANEOUS | 6 refills | Status: DC
  Filled 2020-05-05: qty 2, 28d supply, fill #0
  Filled 2020-06-02: qty 2, 28d supply, fill #1
  Filled 2020-06-26: qty 2, 28d supply, fill #2
  Filled 2020-07-27: qty 2, 28d supply, fill #3
  Filled 2020-08-28: qty 2, 28d supply, fill #4
  Filled 2020-09-23: qty 2, 28d supply, fill #5
  Filled 2020-10-20: qty 2, 28d supply, fill #6

## 2020-05-05 MED ORDER — METFORMIN HCL 1000 MG PO TABS
1000.0000 mg | ORAL_TABLET | Freq: Every day | ORAL | 3 refills | Status: DC
  Filled 2020-05-05 – 2020-06-26 (×2): qty 90, 90d supply, fill #0

## 2020-05-05 MED ORDER — ATORVASTATIN CALCIUM 40 MG PO TABS
40.0000 mg | ORAL_TABLET | Freq: Every day | ORAL | 3 refills | Status: DC
  Filled 2020-05-05: qty 90, 90d supply, fill #0
  Filled 2020-09-15: qty 30, 30d supply, fill #1
  Filled 2020-11-02: qty 30, 30d supply, fill #2
  Filled 2020-12-02: qty 30, 30d supply, fill #3
  Filled 2021-01-07: qty 30, 30d supply, fill #4
  Filled 2021-02-09: qty 30, 30d supply, fill #0
  Filled 2021-02-09: qty 30, 30d supply, fill #5
  Filled 2021-03-15: qty 30, 30d supply, fill #1

## 2020-05-05 MED ORDER — ATORVASTATIN CALCIUM 40 MG PO TABS: 40.0000 mg | ORAL_TABLET | Freq: Every day | ORAL | 3 refills | Status: DC

## 2020-05-05 NOTE — Progress Notes (Signed)
  Patient ID: [name] [name], female    DOB: 01/17/1960  MRN: [id number]  CC: Diabetes and Hypertension   Subjective: [name] [name] is a 60 y.o. female who presents for chronic disease management Her concerns today include:  Pt with hx of DM type 2, obesity, NAFLD, HL, GERD.  DIABETES TYPE 2/Obesity Last A1C:   Results for orders placed or performed in visit on 05/05/20  POCT glucose (manual entry)  Result Value Ref Range   POC Glucose 156 (A) 70 - 99 mg/dl  POCT glycosylated hemoglobin (Hb A1C)  Result Value Ref Range   Hemoglobin A1C     HbA1c POC (<> result, manual entry)     HbA1c, POC (prediabetic range)     HbA1c, POC (controlled diabetic range) 6.6 0.0 - 7.0 %    Med Adherence:  [x] Yes.  Trulicity started on last visit in 12/2019 with Dr. Sword.  Tolerating the medication okay but out of the med x a few wks. previously on metformin 1 g twice a day.  However patient states she was told by the clinical pharmacist to decrease to once a day since being on Trulicity.    Medication side effects:  [] Yes    [x] No Home Monitoring?  [x] Yes  Once a day Home glucose results range: 98-199 Diet Adherence: [x] Yes -she has cut back on eating bread and tortia.  Loss 10 lbs since starting Trulicity Exercise: [x] Yes -walking almost daily for 20 mins Hypoglycemic episodes?: [] Yes    [x] No Numbness of the feet? [] Yes    [x] No Retinopathy hx? [] Yes    [] No Last eye exam: Due for eye exam.  No insurance Comments:   HL:  Out of Lipitor x 1 mth.  Needs refill.  She was tolerating the medication well without muscle aches or cramps.  GERD: takes Omeprazole PRN not every day.  She avoids foods that cause flare of GERD  HM:  Reports having had 2 shots of Moderna COVID-19 vaccine.  2nd shot was 2 mths ago.  She does not have card with her Patient Active Problem List   Diagnosis Date Noted  . Hyperlipidemia associated with type 2 diabetes mellitus (HCC)  06/06/2019  . Screening breast examination 09/27/2018  . Obesity (BMI 30-39.9) 09/29/2016  . NAFL (nonalcoholic fatty liver) 06/16/2016  . Irritant contact dermatitis due to other chemical products 05/20/2016  . Uncontrolled type 2 diabetes mellitus (HCC) 10/21/2014  . Hyperlipidemia 11/04/2013  . Environmental allergies 07/12/2013  . Bell's palsy 11/06/2012  . GERD 11/04/2005     Current Outpatient Medications on File Prior to Visit  Medication Sig Dispense Refill  . aspirin 325 MG tablet Take 1 tablet (325 mg total) by mouth daily. 30 tablet 0  . Blood Glucose Monitoring Suppl (TRUE METRIX METER) w/Device KIT Use as directed 1 kit 0  . cetirizine (ZYRTEC) 10 MG tablet Take 1 tablet (10 mg total) by mouth 2 (two) times daily. 60 tablet 5  . glucose blood (TRUE METRIX BLOOD GLUCOSE TEST) test strip Use as instructed 100 each 12  . ketotifen (ZADITOR) 0.025 % ophthalmic solution Place 1 drop into the right eye 2 (two) times daily. 10 mL 1  . meloxicam (MOBIC) 7.5 MG tablet Take 1 tablet (7.5 mg total) by mouth daily. 30 tablet 1  . omeprazole (PRILOSEC) 20 MG capsule TAKE 1 CAPSULE BY MOUTH DAILY. 30 capsule 0  . TRUEPLUS LANCETS 28G MISC Use as directed   100 each 1  . [DISCONTINUED] sitaGLIPtin (JANUVIA) 100 MG tablet Take 1 tablet (100 mg total) by mouth daily. 30 tablet 6   No current facility-administered medications on file prior to visit.    No Known Allergies  Social History   Socioeconomic History  . Marital status: Married    Spouse name: Not on file  . Number of children: 3  . Years of education: Not on file  . Highest education level: 9th grade  Occupational History  . Not on file  Tobacco Use  . Smoking status: Never Smoker  . Smokeless tobacco: Never Used  Vaping Use  . Vaping Use: Never used  Substance and Sexual Activity  . Alcohol use: No  . Drug use: No  . Sexual activity: Yes    Birth control/protection: None, Surgical  Other Topics Concern  . Not on  file  Social History Narrative  . Not on file   Social Determinants of Health   Financial Resource Strain: Not on file  Food Insecurity: Not on file  Transportation Needs: No Transportation Needs  . Lack of Transportation (Medical): No  . Lack of Transportation (Non-Medical): No  Physical Activity: Not on file  Stress: Not on file  Social Connections: Not on file  Intimate Partner Violence: Not on file    Family History  Problem Relation Age of Onset  . Hypertension Mother   . Diabetes Mother   . Diabetes Father   . Diabetes Sister   . Diabetes Brother   . Diabetes Sister   . Diabetes Sister   . Diabetes Sister   . Colon cancer Neg Hx     Past Surgical History:  Procedure Laterality Date  . TUBAL LIGATION  1995    ROS: Review of Systems Negative except as stated above  PHYSICAL EXAM: BP 121/77   Pulse 66   Resp 18   Wt 189 lb 4 oz (85.8 kg)   SpO2 98%   BMI 35.76 kg/m   Wt Readings from Last 3 Encounters:  05/05/20 189 lb 4 oz (85.8 kg)  01/07/20 199 lb (90.3 kg)  11/28/19 196 lb 9.6 oz (89.2 kg)    Physical Exam  General appearance - alert, well appearing, and in no distress Mental status - normal mood, behavior, speech, dress, motor activity, and thought processes Chest - clear to auscultation, no wheezes, rales or rhonchi, symmetric air entry Heart - normal rate, regular rhythm, normal S1, S2, no murmurs, rubs, clicks or gallops Extremities - peripheral pulses normal, no pedal edema, no clubbing or cyanosis Diabetic Foot Exam - Simple   Simple Foot Form Visual Inspection No deformities, no ulcerations, no other skin breakdown bilaterally: Yes Sensation Testing Intact to touch and monofilament testing bilaterally: Yes Pulse Check Posterior Tibialis and Dorsalis pulse intact bilaterally: Yes Comments      CMP Latest Ref Rng & Units 06/06/2019 08/27/2018 03/30/2018  Glucose 65 - 99 mg/dL 294(H) 133(H) -  BUN 6 - 24 mg/dL 11 11 -  Creatinine  0.57 - 1.00 mg/dL 0.64 0.58 -  Sodium 134 - 144 mmol/L 139 141 -  Potassium 3.5 - 5.2 mmol/L 4.0 4.5 -  Chloride 96 - 106 mmol/L 98 104 -  CO2 20 - 29 mmol/L 23 24 -  Calcium 8.7 - 10.2 mg/dL 9.6 9.5 -  Total Protein 6.0 - 8.5 g/dL 7.2 - 7.3  Total Bilirubin 0.0 - 1.2 mg/dL 0.7 - 0.6  Alkaline Phos 48 - 121 IU/L 108 - 105    AST 0 - 40 IU/L 35 - 21  ALT 0 - 32 IU/L 42(H) - 29   Lipid Panel     Component Value Date/Time   CHOL 216 (H) 06/06/2019 1112   TRIG 233 (H) 06/06/2019 1112   HDL 49 06/06/2019 1112   CHOLHDL 4.4 06/06/2019 1112   CHOLHDL 4.0 06/04/2014 1055   VLDL 40 06/04/2014 1055   LDLCALC 126 (H) 06/06/2019 1112    CBC    Component Value Date/Time   WBC 5.6 06/06/2019 1112   WBC 6.2 07/12/2013 1253   RBC 4.88 06/06/2019 1112   RBC 4.72 07/12/2013 1253   HGB 14.4 06/06/2019 1112   HCT 42.9 06/06/2019 1112   PLT 263 06/06/2019 1112   MCV 88 06/06/2019 1112   MCH 29.5 06/06/2019 1112   MCH 28.8 07/12/2013 1253   MCHC 33.6 06/06/2019 1112   MCHC 34.6 07/12/2013 1253   RDW 13.1 06/06/2019 1112   LYMPHSABS 2.3 07/12/2013 1253   MONOABS 0.6 07/12/2013 1253   EOSABS 0.2 07/12/2013 1253   BASOSABS 0.0 07/12/2013 1253    ASSESSMENT AND PLAN:  1. Type 2 diabetes mellitus with obesity (HCC) A1c now at goal.  Commended her on this.  Also commended her on weight loss.  Encouraged her to continue healthy eating habits and regular exercise.  Continue to monitor blood sugars.  If blood sugars start to drop below 80 after she restarts Trulicity, advised to stop metformin. Encouraged to get eye exam.  Have given her the name of some cost-effective places including [facility]. - POCT glucose (manual entry) - POCT glycosylated hemoglobin (Hb A1C) - Dulaglutide 1.5 MG/0.5ML SOPN; INJECT 1.5 MG INTO THE SKIN ONCE A WEEK.  Dispense: 2 mL; Refill: 6 - CBC - Comprehensive metabolic panel - Microalbumin / creatinine urine ratio - metFORMIN (GLUCOPHAGE) 1000 MG tablet;  Take 1 tablet (1,000 mg total) by mouth daily with breakfast.  Dispense: 90 tablet; Refill: 3  2. Hyperlipidemia associated with type 2 diabetes mellitus ([place]) Refill given on Lipitor. - Lipid panel - atorvastatin (LIPITOR) 40 MG tablet; Take 1 tablet (40 mg total) by mouth daily.  Dispense: 90 tablet; Refill: 3  Advised to bring her COVID-19 vaccine card with her next visit so that we can updated in the system.  Patient was given the opportunity to ask questions.  Patient verbalized understanding of the plan and was able to repeat key elements of the plan.  [employer] Language interpreter used during this encounter. #[id number], Lowella Dandy  Orders Placed This Encounter  Procedures  . CBC  . Comprehensive metabolic panel  . Lipid panel  . Microalbumin / creatinine urine ratio  . POCT glucose (manual entry)  . POCT glycosylated hemoglobin (Hb A1C)     Requested Prescriptions   Signed Prescriptions Disp Refills  . Dulaglutide 1.5 MG/0.5ML SOPN 2 mL 6    Sig: INJECT 1.5 MG INTO THE SKIN ONCE A WEEK.  Marland Kitchen atorvastatin (LIPITOR) 40 MG tablet 90 tablet 3    Sig: Take 1 tablet (40 mg total) by mouth daily.  . metFORMIN (GLUCOPHAGE) 1000 MG tablet 90 tablet 3    Sig: Take 1 tablet (1,000 mg total) by mouth daily with breakfast.    Return in about 4 months (around 09/04/2020).  Karle Plumber, MD, FACP

## 2020-05-06 LAB — MICROALBUMIN / CREATININE URINE RATIO
Creatinine, Urine: 39.7 mg/dL
Microalb/Creat Ratio: <8 mg/g creat (ref 0–29)
Microalbumin, Urine: <3 ug/mL

## 2020-05-06 LAB — LIPID PANEL
Chol/HDL Ratio: 5.3 ratio — ABNORMAL HIGH (ref 0.0–4.4)
Cholesterol, Total: 254 mg/dL — ABNORMAL HIGH (ref 100–199)
HDL: 48 mg/dL (ref >39)
LDL Chol Calc (NIH): 154 mg/dL — ABNORMAL HIGH (ref 0–99)
Triglycerides: 282 mg/dL — ABNORMAL HIGH (ref 0–149)
VLDL Cholesterol Cal: 52 mg/dL — ABNORMAL HIGH (ref 5–40)

## 2020-05-06 LAB — COMPREHENSIVE METABOLIC PANEL
ALT: 22 IU/L (ref 0–32)
AST: 17 IU/L (ref 0–40)
Albumin/Globulin Ratio: 2 (ref 1.2–2.2)
Albumin: 4.8 g/dL (ref 3.8–4.9)
Alkaline Phosphatase: 109 IU/L (ref 44–121)
BUN/Creatinine Ratio: 18 (ref 9–23)
BUN: 11 mg/dL (ref 6–24)
Bilirubin Total: 0.7 mg/dL (ref 0.0–1.2)
CO2: 23 mmol/L (ref 20–29)
Calcium: 9.6 mg/dL (ref 8.7–10.2)
Chloride: 102 mmol/L (ref 96–106)
Creatinine, Ser: 0.6 mg/dL (ref 0.57–1.00)
Globulin, Total: 2.4 g/dL (ref 1.5–4.5)
Glucose: 127 mg/dL — ABNORMAL HIGH (ref 65–99)
Potassium: 4.2 mmol/L (ref 3.5–5.2)
Sodium: 142 mmol/L (ref 134–144)
Total Protein: 7.2 g/dL (ref 6.0–8.5)
eGFR: 103 mL/min/{1.73_m2} (ref >59)

## 2020-05-06 LAB — CBC
Hematocrit: 40.5 % (ref 34.0–46.6)
Hemoglobin: 13.8 g/dL (ref 11.1–15.9)
MCH: 29.6 pg (ref 26.6–33.0)
MCHC: 34.1 g/dL (ref 31.5–35.7)
MCV: 87 fL (ref 79–97)
Platelets: 287 10*3/uL (ref 150–450)
RBC: 4.66 x10E6/uL (ref 3.77–5.28)
RDW: 13 % (ref 11.7–15.4)
WBC: 5.9 10*3/uL (ref 3.4–10.8)

## 2020-06-02 ENCOUNTER — Other Ambulatory Visit: Payer: Self-pay

## 2020-06-03 ENCOUNTER — Other Ambulatory Visit: Payer: Self-pay

## 2020-06-26 ENCOUNTER — Other Ambulatory Visit: Payer: Self-pay

## 2020-06-29 ENCOUNTER — Other Ambulatory Visit: Payer: Self-pay

## 2020-07-27 ENCOUNTER — Ambulatory Visit: Payer: No Typology Code available for payment source

## 2020-07-27 ENCOUNTER — Other Ambulatory Visit: Payer: Self-pay | Admitting: Internal Medicine

## 2020-07-27 DIAGNOSIS — K219 Gastro-esophageal reflux disease without esophagitis: Secondary | ICD-10-CM

## 2020-07-27 MED ORDER — OMEPRAZOLE 20 MG PO CPDR
DELAYED_RELEASE_CAPSULE | Freq: Every day | ORAL | 0 refills | Status: DC
  Filled 2020-07-27: qty 30, 30d supply, fill #0

## 2020-07-28 ENCOUNTER — Other Ambulatory Visit: Payer: Self-pay

## 2020-07-29 ENCOUNTER — Other Ambulatory Visit: Payer: Self-pay

## 2020-08-04 ENCOUNTER — Other Ambulatory Visit: Payer: Self-pay

## 2020-08-04 ENCOUNTER — Ambulatory Visit: Payer: Self-pay | Attending: Internal Medicine

## 2020-08-31 ENCOUNTER — Other Ambulatory Visit: Payer: Self-pay

## 2020-09-04 ENCOUNTER — Encounter: Payer: Self-pay | Admitting: Internal Medicine

## 2020-09-04 ENCOUNTER — Ambulatory Visit: Payer: Self-pay | Attending: Internal Medicine | Admitting: Internal Medicine

## 2020-09-04 ENCOUNTER — Other Ambulatory Visit: Payer: Self-pay

## 2020-09-04 VITALS — BP 111/68 | HR 77 | Resp 16 | Wt 182.8 lb

## 2020-09-04 DIAGNOSIS — E1169 Type 2 diabetes mellitus with other specified complication: Secondary | ICD-10-CM

## 2020-09-04 DIAGNOSIS — E669 Obesity, unspecified: Secondary | ICD-10-CM

## 2020-09-04 DIAGNOSIS — E785 Hyperlipidemia, unspecified: Secondary | ICD-10-CM

## 2020-09-04 DIAGNOSIS — M5416 Radiculopathy, lumbar region: Secondary | ICD-10-CM

## 2020-09-04 DIAGNOSIS — Z23 Encounter for immunization: Secondary | ICD-10-CM

## 2020-09-04 LAB — GLUCOSE, POCT (MANUAL RESULT ENTRY): POC Glucose: 125 mg/dl — AB (ref 70–99)

## 2020-09-04 MED ORDER — METFORMIN HCL 1000 MG PO TABS
1000.0000 mg | ORAL_TABLET | Freq: Every day | ORAL | 3 refills | Status: DC
Start: 2020-09-04 — End: 2021-03-08
  Filled 2020-09-04: qty 30, 30d supply, fill #0

## 2020-09-04 MED ORDER — MELOXICAM 7.5 MG PO TABS
7.5000 mg | ORAL_TABLET | Freq: Every day | ORAL | 4 refills | Status: DC
  Filled 2020-09-04: qty 30, 30d supply, fill #0
  Filled 2020-11-02: qty 30, 30d supply, fill #1
  Filled 2020-12-15: qty 30, 30d supply, fill #2
  Filled 2021-02-09: qty 30, 30d supply, fill #0
  Filled 2021-02-09: qty 30, 30d supply, fill #3
  Filled 2021-03-27: qty 30, 30d supply, fill #1

## 2020-09-04 NOTE — Patient Instructions (Signed)
Dolor radicular Radicular Pain El dolor radicular es un tipo de dolor que se extiende desde la espalda o el cuello a lo largo del nervio raqudeo. Los nervios raqudeos se originan en la mdula espinal y llegan a los msculos. El dolor radicular a veces se conoce como radiculopata, radiculitis o pinzamiento de un nervio. Si tiene este tipo de Engineer, mining, tambin puede sentir debilidad, adormecimiento u hormigueo en la zona del cuerpo a la que llega ese nervio. El dolor puede ser agudo y sentirse como un ardor. Segn el nervio raqudeo que est afectado, el dolor puede aparecer en los siguientes lugares: Zona del cuello (dolor radicular cervical). Tambin puede Financial risk analyst, adormecimiento, debilidad u hormigueo [employer]. Zona de la mitad de la columna (dolor radicular torcico). Se siente en la espalda y en el pecho. Este tipo es poco frecuente. Zona inferior de la espalda (dolor radicular lumbar). Se siente un dolor lumbar. Tambin puede Financial risk analyst, adormecimiento, debilidad u hormigueo en las nalgas o en las piernas. La citica es un tipo de dolor radicular lumbar que se extiende por la parte de atrs de la pierna. El dolor radicular se produce cuando uno de los nervios espinales se irrita o se aprieta (comprime). A menudo se debe a algo que ejerce presin sobre un nervio espinal, como uno de los huesos de la columna vertebral (vrtebras) o una de las almohadillas redondas que estn entre las vrtebras (discos intervertebrales). Esto puede deberse a lo siguiente: Una lesin. El desgaste o el envejecimiento de un disco. El crecimiento de un espoln seo que aprieta el Rumsey. El dolor radicular suele desaparecer cuando se siguen las indicaciones delmdico sobre cmo aliviarlo en la casa. Siga estas indicaciones en su casa: Control del dolor     Si se lo indican, aplique hielo sobre la zona afectada: Ponga el hielo en una bolsa plstica. Coloque una toalla entre la piel y Copy. Coloque el  hielo durante 20 minutos, 2 a 3 veces por da. Si se lo indican, aplique calor en la zona afectada con la frecuencia que le haya indicado el mdico. Use la fuente de calor que el mdico le recomiende, como una compresa de calor hmedo o una almohadilla trmica. Coloque una toalla entre la piel y la fuente de Airline pilot. Aplique calor durante 20 a 30 minutos. Retire la fuente de calor si la piel se pone de color rojo brillante. Esto es especialmente importante si no puede sentir dolor, calor o fro. Puede correr un riesgo mayor de sufrir quemaduras. Actividad  No permanezca sentado o acostado durante largos perodos. Intente mantenerse lo ms activo posible. Pregntele al mdico qu tipo de ejercicio o actividad es mejor para usted. Evite las actividades que empeoren el dolor, como doblarse y Lexicographer objetos. No levante ningn objeto que pese ms de 10 libras (4,5 kg) o el lmite de peso que le hayan indicado, hasta que el mdico le diga que puede [place]. Use una tcnica adecuada al levantar objetos. La tcnica adecuada para levantar objetos consiste en doblar las rodillas y levantarse. Haga ejercicios de fuerza y flexibilidad solamente como se lo haya indicado el mdico o el fisioterapeuta.  Indicaciones generales [employer] de venta libre y los recetados solamente como se lo haya indicado el mdico. Est atento a cualquier [employer] sntomas. Concurra a todas las visitas de [address] se lo haya indicado el mdico. Esto es importante. El mdico lo puede enviar a un fisioterapeuta para Electrical engineer. Comunquese con  un mdico si: El dolor y otros sntomas empeoran. El medicamento no IT trainer. El dolor no mejora despus de unas semanas de cuidado Facilities manager. Tiene fiebre. Solicite ayuda inmediatamente si: Licensed conveyancer, adormecimiento o debilidad intensos. Tiene dificultad con el control de la vejiga o los intestinos. Resumen El dolor radicular es un tipo de  dolor que se extiende desde la espalda o el cuello a lo largo del nervio raqudeo. Si tiene dolor radicular, tambin puede sentir debilidad, adormecimiento u hormigueo en la zona del cuerpo a la que llega ese nervio. El dolor puede ser agudo o sentirse como ardor. El dolor radicular puede tratarse con hielo, calor, medicamentos o fisioterapia. Esta informacin no tiene Theme park manager el consejo del mdico. Asegresede hacerle al mdico cualquier pregunta que tenga. Document Revised: 09/06/2017 Document Reviewed: 09/06/2017 Elsevier Patient Education  2022 [employer].

## 2020-09-04 NOTE — Progress Notes (Signed)
Patient ID: [name] [name], female    DOB: 11/24/1960  MRN: [id number]  CC: Diabetes   Subjective: [name] [name] is a 60 y.o. female who presents for chronic ds management Her concerns today include:  Pt with hx of DM type 2, obesity, NAFLD, HL, GERD.  DIABETES TYPE 2 Last A1C:   Results for orders placed or performed in visit on 09/04/20  POCT glucose (manual entry)  Result Value Ref Range   POC Glucose 125 (A) 70 - 99 mg/dl    Lab Results  Component Value Date   HGBA1C 6.6 05/05/2020    Med Adherence:  [x]  Yes  - Trulicity and Metformin Medication side effects:  []  Yes    [x]  No Home Monitoring?  [x]  Yes once a day before dinner    []  No Home glucose results range: she has glucometer with her -range 95-147 with 30 day avg of 116 Diet Adherence: [x]  Yes.  Loss 17 lbs since 12/2019    []  No Exercise: [x]  Yes -walking 3x/wk Hypoglycemic episodes?: [x]  Yes    []  No Numbness of the feet? []  Yes    [x]  No Retinopathy hx? []  Yes    []  No Last eye exam:  Comments:   HL: she was out Lipitor on last visit and LDL was 154.  RF given.  Taking and tolerating.   GERD: takes Prilosec PRN  Request RF on Mobic.  Gets pain in LT side of lower back that radiates to LT leg x 2 mths. No initiating factor.  Wax and wanes.  Pain in leg intermittent.  Worse with prolong standing.  No numbness or tingling in the leg.   No incontinence of urine or bowel.Mobic very helpful  Patient Active Problem List   Diagnosis Date Noted   Hyperlipidemia associated with type 2 diabetes mellitus ([place]) 06/06/2019   Screening breast examination 09/27/2018   Obesity (BMI 30-39.9) 09/29/2016   NAFL (nonalcoholic fatty liver) 85/63/1497   Irritant contact dermatitis due to other chemical products 05/20/2016   Uncontrolled type 2 diabetes mellitus ([place]) 10/21/2014   Hyperlipidemia 11/04/2013   Environmental allergies 07/12/2013   Bell's palsy 11/06/2012   GERD 11/04/2005     Current  Outpatient Medications on File Prior to Visit  Medication Sig Dispense Refill   aspirin 325 MG tablet Take 1 tablet (325 mg total) by mouth daily. 30 tablet 0   atorvastatin (LIPITOR) 40 MG tablet Take 1 tablet (40 mg total) by mouth daily. 90 tablet 3   atorvastatin (LIPITOR) 40 MG tablet Take 1 tablet (40 mg total) by mouth daily. 90 tablet 3   Blood Glucose Monitoring Suppl (TRUE METRIX METER) w/Device KIT Use as directed 1 kit 0   cetirizine (ZYRTEC) 10 MG tablet Take 1 tablet (10 mg total) by mouth 2 (two) times daily. 60 tablet 5   Dulaglutide 1.5 MG/0.5ML SOPN INJECT 1.5 MG INTO THE SKIN ONCE A WEEK. 2 mL 6   glucose blood (TRUE METRIX BLOOD GLUCOSE TEST) test strip Use as instructed 100 each 12   ketotifen (ZADITOR) 0.025 % ophthalmic solution Place 1 drop into the right eye 2 (two) times daily. 10 mL 1   omeprazole (PRILOSEC) 20 MG capsule Take 1 capsule by mouth daily. 30 capsule 0   TRUEPLUS LANCETS 28G MISC Use as directed 100 each 1   [DISCONTINUED] sitaGLIPtin (JANUVIA) 100 MG tablet Take 1 tablet (100 mg total) by mouth daily. 30 tablet 6   No current facility-administered medications on file  prior to visit.    No Known Allergies  Social History   Socioeconomic History   Marital status: Married    Spouse name: Not on file   Number of children: 3   Years of education: Not on file   Highest education level: 9th grade  Occupational History   Not on file  Tobacco Use   Smoking status: Never   Smokeless tobacco: Never  Vaping Use   Vaping Use: Never used  Substance and Sexual Activity   Alcohol use: No   Drug use: No   Sexual activity: Yes    Birth control/protection: None, Surgical  Other Topics Concern   Not on file  Social History Narrative   Not on file   Social Determinants of Health   Financial Resource Strain: Not on file  Food Insecurity: Not on file  Transportation Needs: No Transportation Needs   Lack of Transportation (Medical): No   Lack of  Transportation (Non-Medical): No  Physical Activity: Not on file  Stress: Not on file  Social Connections: Not on file  Intimate Partner Violence: Not on file    Family History  Problem Relation Age of Onset   Hypertension Mother    Diabetes Mother    Diabetes Father    Diabetes Sister    Diabetes Brother    Diabetes Sister    Diabetes Sister    Diabetes Sister    Colon cancer Neg Hx     Past Surgical History:  Procedure Laterality Date   TUBAL LIGATION  1995    ROS: Review of Systems Negative except as stated above  PHYSICAL EXAM: BP 111/68   Pulse 77   Resp 16   Wt 182 lb 12.8 oz (82.9 kg)   SpO2 97%   BMI 34.54 kg/m   Wt Readings from Last 3 Encounters:  09/04/20 182 lb 12.8 oz (82.9 kg)  05/05/20 189 lb 4 oz (85.8 kg)  01/07/20 199 lb (90.3 kg)    Physical Exam  General appearance - alert, well appearing, middle-aged older Hispanic female and in no distress Mental status - normal mood, behavior, speech, dress, motor activity, and thought processes Neck - supple, no significant adenopathy Chest - clear to auscultation, no wheezes, rales or rhonchi, symmetric air entry Heart - normal rate, regular rhythm, normal S1, S2, no murmurs, rubs, clicks or gallops Neurological -power in both lower extremities 5/5 bilaterally.  Gross sensation intact. Extremities - peripheral pulses normal, no pedal edema, no clubbing or cyanosis Diabetic Foot Exam - Simple   Simple Foot Form Visual Inspection No deformities, no ulcerations, no other skin breakdown bilaterally: Yes Sensation Testing Intact to touch and monofilament testing bilaterally: Yes Pulse Check Posterior Tibialis and Dorsalis pulse intact bilaterally: Yes Comments      CMP Latest Ref Rng & Units 05/05/2020 06/06/2019 08/27/2018  Glucose 65 - 99 mg/dL 127(H) 294(H) 133(H)  BUN 6 - 24 mg/dL $Remove'11 11 11  'DazEobZ$ Creatinine 0.57 - 1.00 mg/dL 0.60 0.64 0.58  Sodium 134 - 144 mmol/L 142 139 141  Potassium 3.5 - 5.2  mmol/L 4.2 4.0 4.5  Chloride 96 - 106 mmol/L 102 98 104  CO2 20 - 29 mmol/L $RemoveB'23 23 24  'TJblhBUk$ Calcium 8.7 - 10.2 mg/dL 9.6 9.6 9.5  Total Protein 6.0 - 8.5 g/dL 7.2 7.2 -  Total Bilirubin 0.0 - 1.2 mg/dL 0.7 0.7 -  Alkaline Phos 44 - 121 IU/L 109 108 -  AST 0 - 40 IU/L 17 35 -  ALT 0 -  32 IU/L 22 42(H) -   Lipid Panel     Component Value Date/Time   CHOL 254 (H) 05/05/2020 1215   TRIG 282 (H) 05/05/2020 1215   HDL 48 05/05/2020 1215   CHOLHDL 5.3 (H) 05/05/2020 1215   CHOLHDL 4.0 06/04/2014 1055   VLDL 40 06/04/2014 1055   LDLCALC 154 (H) 05/05/2020 1215    CBC    Component Value Date/Time   WBC 5.9 05/05/2020 1215   WBC 6.2 07/12/2013 1253   RBC 4.66 05/05/2020 1215   RBC 4.72 07/12/2013 1253   HGB 13.8 05/05/2020 1215   HCT 40.5 05/05/2020 1215   PLT 287 05/05/2020 1215   MCV 87 05/05/2020 1215   MCH 29.6 05/05/2020 1215   MCH 28.8 07/12/2013 1253   MCHC 34.1 05/05/2020 1215   MCHC 34.6 07/12/2013 1253   RDW 13.0 05/05/2020 1215   LYMPHSABS 2.3 07/12/2013 1253   MONOABS 0.6 07/12/2013 1253   EOSABS 0.2 07/12/2013 1253   BASOSABS 0.0 07/12/2013 1253    ASSESSMENT AND PLAN: 1. Type 2 diabetes mellitus with obesity ([place]) Patient doing well on metformin and Trulicity.  No changes made in medications.  Encouraged to continue healthy eating habits and regular exercise. - POCT glucose (manual entry) - metFORMIN (GLUCOPHAGE) 1000 MG tablet; Take 1 tablet (1,000 mg total) by mouth daily with breakfast.  Dispense: 90 tablet; Refill: 3  2. Hyperlipidemia associated with type 2 diabetes mellitus (HCC) Continue atorvastatin.  We will plan to recheck lipid profile on next visit.  3. Lumbar radiculopathy, acute Inform patient that her symptoms are likely due to nerve irritation in the lower back or disc issue.  At this time she reports relief with meloxicam.  Refill given.  Advised to avoid any heavy lifting, pushing or pulling.  Referral given for some physical therapy. - meloxicam  (MOBIC) 7.5 MG tablet; Take 1 tablet (7.5 mg total) by mouth daily.  Dispense: 30 tablet; Refill: 4 - Ambulatory referral to Physical Therapy  4. Need for vaccination against Streptococcus pneumoniae Prevnar 20 given today by my CMA.    Patient was given the opportunity to ask questions.  Patient verbalized understanding of the plan and was able to repeat key elements of the plan.  [employer] Language interpreter used during this encounter. #[id number], [place]  Orders Placed This Encounter  Procedures   Ambulatory referral to Physical Therapy   POCT glucose (manual entry)     Requested Prescriptions   Signed Prescriptions Disp Refills   meloxicam (MOBIC) 7.5 MG tablet 30 tablet 4    Sig: Take 1 tablet (7.5 mg total) by mouth daily.   metFORMIN (GLUCOPHAGE) 1000 MG tablet 90 tablet 3    Sig: Take 1 tablet (1,000 mg total) by mouth daily with breakfast.    Return in about 4 months (around 01/04/2021).  Karle Plumber, MD, FACP

## 2020-09-15 ENCOUNTER — Other Ambulatory Visit: Payer: Self-pay

## 2020-09-22 ENCOUNTER — Ambulatory Visit: Payer: No Typology Code available for payment source | Attending: Internal Medicine | Admitting: Physical Therapy

## 2020-09-22 ENCOUNTER — Encounter: Payer: Self-pay | Admitting: Physical Therapy

## 2020-09-22 ENCOUNTER — Other Ambulatory Visit: Payer: Self-pay

## 2020-09-22 DIAGNOSIS — M5442 Lumbago with sciatica, left side: Secondary | ICD-10-CM | POA: Insufficient documentation

## 2020-09-22 NOTE — Therapy (Signed)
[facility] Outpatient Rehabilitation [facility] [address] [place], [place], [address] Phone: [phone]   Fax:  [phone]  Physical Therapy Evaluation  Patient Details  Name: [name] [name] MRN: [id number] Date of Birth: [birth year] Referring Provider (PT): Marcine Matar, MD  Encounter Date: 09/22/2020   PT End of Session - 09/22/20 0908     Visit Number 1    Number of Visits 16    Date for PT Re-Evaluation 11/17/20    Authorization Type Generic Commercial    PT Start Time 0800    PT Stop Time 0901    PT Time Calculation (min) 61 min    Activity Tolerance Patient tolerated treatment well    Behavior During Therapy [facility] for tasks assessed/performed             Past Medical History:  Diagnosis Date   Diabetes mellitus without complication (HCC)    Hyperlipidemia    Medical history non-contributory     Past Surgical History:  Procedure Laterality Date   TUBAL LIGATION  1995    There were no vitals filed for this visit.    Subjective Assessment - 09/22/20 0905     Subjective [name] [name] is a 60 y.o. female who presents to clinic with chief complaint of L sided low back pain with radiation in to L LE.  MOI/History of condition: History of some back pain on R sided, but this seems unrelated.  L sided back pain starting about 5 months ago.  Pain radiates to the back of the knee.  Denies lifting injury.  She reports a box fell on her at work sometime before (likely unrelated)  Pain location: L sided low back pain with radiation into L LE (anterior and posterior to knee).  Red flags: denies n/t, denies weakness, denies bb changes, denies saddle anesthesia.  48 hour pain intensity:  highest 7/10, current 0/10, best 0/10.  Aggs: bending forward, getting down on floor at work.  Eases: walking.  Nature: shooting, aching.  Severity: moderate.  Irritability: moderate Stage: chronic.  Stability: staying the same.  24 hour pattern: no  clear pattern.  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   Functional limitations/goals: work without pain, reduce pain.  Home environment: lives at home with husband.  Assistive device: none.   Hand dominance: R.  Falls: none.    Patient is accompained by: Family member    Pertinent History Significant PMH: DM TII                [facility] PT Assessment - 09/22/20 0001       Assessment   Medical Diagnosis Referral diagnosis: Lumbar radiculopathy, acute (M54.16)    Referring Provider (PT) Marcine Matar, MD    Onset Date/Surgical Date 04/22/20    Hand Dominance Right    Next MD Visit unknown    Prior Therapy none      Precautions   Precaution Comments DM TII      Restrictions   Weight Bearing Restrictions No      Balance Screen   Has the patient fallen in the past 6 months No      Prior Function   Level of Independence Independent      Observation/Other Assessments   Focus on Therapeutic Outcomes (FOTO)  next visit      Sensation   Light Touch Appears Intact      ROM / Strength   AROM / PROM / Strength AROM;Strength  AROM   AROM Assessment Site Lumbar    Lumbar Flexion 25 cm *P!    Lumbar Extension WNL P!    Lumbar - Right Side Bend limited 25% w/ *P!    Lumbar - Left Side Bend limited 25% w/ *P!    Lumbar - Right Rotation WNL P!    Lumbar - Left Rotation WNL p!      Strength   Overall Strength Comments myotomes WNL      Palpation   Palpation comment TTP L QL, PA of lumbar spine is relieving      Special Tests   Other special tests Slump (+), ext preference                        Objective measurements completed on examination: See above findings.               PT Education - 09/22/20 0906     Education Details POC, diagnosis, prognosis, HEP.  Pt educated via explanation, demonstration, and handout (HEP).  Pt confirms understanding verbally.                 PT  Long Term Goals - 09/22/20 0902       PT LONG TERM GOAL #1   Title [name] [name] will be >75% HEP compliant throughout therapy to improve carryover between sessions and facilitate independent management of condition  target date: 11/17/2020      PT LONG TERM GOAL #2   Title FOTO goal      PT LONG TERM GOAL #3   Title [name] [name] will be able to complete work with pain level <3/10  EVAL: pain as high a 7/10  target date: 11/17/2020      PT LONG TERM GOAL #4   Title [name] [name] will be able to demonstrate forward flexion to 25 cm with <3/10 pain, not limited by pain  EVAL: 25 cm with 7/10 pain  target date: 11/17/2020                    Plan - 09/22/20 0909     Clinical Impression Statement [name] [name] is a 60 y.o. female who presents to clinic with signs and sxs consistent with L sided low back pain with radicular pain.  No signs of neural compression at this time.  Clear directional preference for ext.  Pt presents with pain and impairments/deficits in: lumbar ROM.  Activity limitations include: lifting, bending, getting on floor.  Participation limitations include: working without pain, standing for long periods without pain.  Pt will benefit from skilled therapy to address pain and the listed deficits in order to achieve functional goals, enable safety and independence in completion of daily tasks, and return to PLOF.    Stability/Clinical Decision Making Stable/Uncomplicated    Clinical Decision Making Low    Rehab Potential Good    PT Frequency 2x / week    PT Duration 8 weeks    PT Treatment/Interventions ADLs/Self Care Home Management;Aquatic Therapy;Iontophoresis 4mg /ml Dexamethasone;Therapeutic activities;Therapeutic exercise;Neuromuscular re-education;Manual techniques;Moist Heat;Dry needling;Spinal Manipulations;Joint Manipulations;Vasopneumatic Device    PT Next Visit Plan ext based progression, take FOTO,  core/hip progression, lifting mechanics education    PT Home Exercise Plan [email or web address]    Consulted and Agree with Plan of Care Patient             Patient will benefit from skilled therapeutic intervention in order to improve the following deficits and impairments:  Pain,  Decreased range of motion, Impaired flexibility  Visit Diagnosis: Left-sided low back pain with left-sided sciatica, unspecified chronicity     Problem List Patient Active Problem List   Diagnosis Date Noted   Hyperlipidemia associated with type 2 diabetes mellitus (HCC) 06/06/2019   Screening breast examination 09/27/2018   Obesity (BMI 30-39.9) 09/29/2016   NAFL (nonalcoholic fatty liver) 06/16/2016   Irritant contact dermatitis due to other chemical products 05/20/2016   Uncontrolled type 2 diabetes mellitus (HCC) 10/21/2014   Hyperlipidemia 11/04/2013   Environmental allergies 07/12/2013   Bell's palsy 11/06/2012   GERD 11/04/2005    [name] [name] PT, DPT 09/22/20 9:15 AM  [facility] [address] [place], [place], [address] Phone: [phone]   Fax:  [phone]  Name: [name] [name] MRN: [id number] Date of Birth: 10/10/1960

## 2020-09-22 NOTE — Patient Instructions (Signed)
Access Code: [email or web address] URL: [email or web address] Date: 09/22/2020 Prepared by: Alphonzo Severance  Exercises Prone Press Up On Elbows - 5 x daily - 7 x weekly - 2 sets - 10 reps Seated Sciatic Tensioner - 3 x daily - 7 x weekly - 2 sets - 10 reps

## 2020-09-23 ENCOUNTER — Other Ambulatory Visit: Payer: Self-pay

## 2020-09-29 ENCOUNTER — Encounter: Payer: Self-pay | Admitting: Physical Therapy

## 2020-09-29 ENCOUNTER — Other Ambulatory Visit: Payer: Self-pay

## 2020-09-29 ENCOUNTER — Ambulatory Visit: Payer: No Typology Code available for payment source | Admitting: Physical Therapy

## 2020-09-29 DIAGNOSIS — M5442 Lumbago with sciatica, left side: Secondary | ICD-10-CM

## 2020-09-29 NOTE — Patient Instructions (Signed)
Access Code: [email or web address] URL: [email or web address] Date: 09/29/2020 Prepared by: Alphonzo Severance  Exercises Prone Press Up On Elbows - 5 x daily - 7 x weekly - 2 sets - 10 reps Seated Sciatic Tensioner - 3 x daily - 7 x weekly - 2 sets - 10 reps Marching Bridge - 1 x daily - 7 x weekly - 3 sets - 10 reps

## 2020-09-29 NOTE — Therapy (Signed)
[facility] Outpatient Rehabilitation [facility] [address] [place], [place], [address] Phone: [phone]   Fax:  [phone]  Physical Therapy Treatment  Patient Details  Name: [name] [name] MRN: [id number] Date of Birth: Sep 24, 1960 Referring Provider (PT): Marcine Matar, MD   Encounter Date: 09/29/2020   PT End of Session - 09/29/20 1642     Visit Number 2    Number of Visits 16    Date for PT Re-Evaluation 11/17/20    Authorization Type Generic Commercial    PT Start Time 1645    PT Stop Time 1730    PT Time Calculation (min) 45 min    Activity Tolerance Patient tolerated treatment well    Behavior During Therapy [facility] for tasks assessed/performed             Past Medical History:  Diagnosis Date   Diabetes mellitus without complication (HCC)    Hyperlipidemia    Medical history non-contributory     Past Surgical History:  Procedure Laterality Date   TUBAL LIGATION  1995    There were no vitals filed for this visit.   Subjective Assessment - 09/29/20 1643     Subjective Pt reports that her pain is much improved.  She has had minimal pain since doing her exercises.  L sided LBP with radiating pain 48 hour pain intensity: 2/10 Aggs: bending forward, getting down on floor at work.  Eases: walking.    Patient is accompained by: Interpreter   [name] [name] [phone]   Pertinent History Significant PMH: DM TII                [facility] PT Assessment - 09/29/20 0001       Observation/Other Assessments   Focus on Therapeutic Outcomes (FOTO)  75      Flexibility   Soft Tissue Assessment /Muscle Length yes    Hamstrings HS 90-90: lacking 95 degrees (L)               PT Education - 09/29/20 1721     Education Details HEP    Person(s) Educated Patient    Methods Handout    Comprehension Verbalized understanding;Returned demonstration            [facility] Adult PT Treatment/Exercise:  Therapeutic Exercise: - nu-step L5 5  min while taking subjective - REIS at counter (demonstrated) - Bridge with 5'' hold - 2x10 - Bridge with march - 10x (hips down between) - Alternating clamshell - RTB - 2x10 ea - LTR - 10x  Manual Therapy: (not today) - CPA and UPAs lumbar spine, pt in prone  Therapeutic Activity - collecting FOTO   PT Short Term Goals - 09/29/20 1724       PT SHORT TERM GOAL #1   Title [name] [name] will be >75% HEP compliant throughout therapy to improve carryover between sessions and facilitate independent management of condition.    Target Date 10/13/20                  PT Long Term Goals - 09/29/20 1731       PT LONG TERM GOAL #1   Title [name] [name] will be >75% HEP compliant throughout therapy to improve carryover between sessions and facilitate independent management of condition  target date: 11/17/2020      PT LONG TERM GOAL #2   Title [name] [name] will improve 90-90 HS test to lacking 70 degrees from full knee extension (<20 degrees considered normal).  Eval:  L 95  degrees  target date: 11/17/20      PT LONG TERM GOAL #3   Title [name] [name] will be able to complete work with pain level <3/10  EVAL: pain as high a 7/10  target date: 11/17/2020      PT LONG TERM GOAL #4   Title [name] [name] will be able to demonstrate forward flexion to 25 cm with <3/10 pain, not limited by pain  EVAL: 25 cm with 7/10 pain  target date: 11/17/2020              Plan - 09/29/20 1738     Clinical Impression Statement Pt reports no increase in baseline pain following therapy  HEP was updated and reissued to patient    Overall, [name] [name] is progressing very well with therapy.  Today we concentrated on core strengthening and hip strengthening.  Pt has no pain with therapy.  She fatigues with marching bridge.  We will continue with ext based exercises with D/C next session if pain remains at 0/10.  Pt  will continue to benefit from skilled physical therapy to address remaining deficits and achieve listed goals.  Continue per POC.    Stability/Clinical Decision Making Stable/Uncomplicated    Rehab Potential Good    PT Frequency 2x / week    PT Duration 8 weeks    PT Treatment/Interventions ADLs/Self Care Home Management;Aquatic Therapy;Iontophoresis 4mg /ml Dexamethasone;Therapeutic activities;Therapeutic exercise;Neuromuscular re-education;Manual techniques;Moist Heat;Dry needling;Spinal Manipulations;Joint Manipulations;Vasopneumatic Device    PT Next Visit Plan ext based progression, take FOTO, core/hip progression, lifting mechanics education    PT Home Exercise Plan [email or web address]    Consulted and Agree with Plan of Care Patient              Patient will benefit from skilled therapeutic intervention in order to improve the following deficits and impairments:  Pain, Decreased range of motion, Impaired flexibility  Visit Diagnosis: Left-sided low back pain with left-sided sciatica, unspecified chronicity     Problem List Patient Active Problem List   Diagnosis Date Noted   Hyperlipidemia associated with type 2 diabetes mellitus (HCC) 06/06/2019   Screening breast examination 09/27/2018   Obesity (BMI 30-39.9) 09/29/2016   NAFL (nonalcoholic fatty liver) 06/16/2016   Irritant contact dermatitis due to other chemical products 05/20/2016   Uncontrolled type 2 diabetes mellitus (HCC) 10/21/2014   Hyperlipidemia 11/04/2013   Environmental allergies 07/12/2013   Bell's palsy 11/06/2012   GERD 11/04/2005    11/06/2005, PT 09/29/2020, 5:45 PM  [facility] Out[facility] [address] [place], [place], [place] Phone: [phone]   Fax:  [phone]  Name: [name] [name] MRN: [name] [name] Date of Birth: [name] [name]

## 2020-10-06 ENCOUNTER — Other Ambulatory Visit: Payer: Self-pay

## 2020-10-06 ENCOUNTER — Ambulatory Visit: Payer: Self-pay | Admitting: Physical Therapy

## 2020-10-06 ENCOUNTER — Encounter: Payer: Self-pay | Admitting: Physical Therapy

## 2020-10-06 DIAGNOSIS — M5442 Lumbago with sciatica, left side: Secondary | ICD-10-CM

## 2020-10-06 NOTE — Patient Instructions (Signed)
Access Code: [email or web address] URL: [email or web address] Date: 10/06/2020 Prepared by: Alphonzo Severance  Exercises Seated Sciatic Tensioner - 3 x daily - 7 x weekly - 2 sets - 10 reps Marching Bridge - 1 x daily - 7 x weekly - 3 sets - 10 reps Bird Dog - 1 x daily - 7 x weekly - 2 sets - 10 reps - 5 hold Prone Press Up - 1 x daily - 7 x weekly - 3 sets - 10 reps Standing Lumbar Extension with Counter - 3 x daily - 7 x weekly - 2 sets - 20 reps

## 2020-10-06 NOTE — Therapy (Addendum)
[place], [place], [address] Phone: [phone]   Fax:  [phone]  PHYSICAL THERAPY UNPLANNED DISCHARGE SUMMARY   Visits from Start of Care: 3  Current functional level related to goals / functional outcomes: Current status unknown   Remaining deficits: Current status unknown   Education / Equipment: Pt has not returned since visit listed below  Patient goals were not assessed. Patient is being discharged due to not returning since the last visit.  (the below note was addended to include the above D/C summary on 10/29/20)   Physical Therapy Treatment  Patient Details  Name: [name] [name] MRN: [id number] Date of Birth: 13-Aug-1960 Referring Provider (PT): Ladell Pier, MD   Encounter Date: 10/06/2020   PT End of Session - 10/06/20 0826     Visit Number 3    Number of Visits 16    Date for PT Re-Evaluation 11/17/20    Authorization Type Generic Commercial    PT Start Time 0830    PT Stop Time 0915    PT Time Calculation (min) 45 min    Activity Tolerance Patient tolerated treatment well    Behavior During Therapy WFL for tasks assessed/performed             Past Medical History:  Diagnosis Date   Diabetes mellitus without complication ([place])    Hyperlipidemia    Medical history non-contributory     Past Surgical History:  Procedure Laterality Date   TUBAL LIGATION  1995    There were no vitals filed for this visit.   Subjective Assessment - 10/06/20 0834     Subjective Pt reports that her pain is much improved.  She has been HEP compliant and has had minimal pain at work.  L sided LBP with radiating pain 48 hour pain intensity: 0/10.    Patient is accompained by: Interpreter   iPad   Pertinent History Significant PMH: DM TII             [facility] Adult PT Treatment/Exercise:   Therapeutic Exercise: - nu-step L5 5 min while taking subjective - REIL -  20x - REIS at counter (demonstrated) - Bird dog - 5'' hold - 2x10 - Bridge with march - 2x10 - Hip adduction ball squeeze - 10''x10 - Alternating clamshell - GTB - 2x10 ea - LTR - 20x - Reviewing HEP  Manual therapy, concentrating on increasing extensibility of restricted tissue to reduce discomfort and improve mechanics in functional movement:  - UPA and CPA lumbar spine, pt in prone, grade II-III    PT Education - 10/06/20 0858     Education Details HEP    Person(s) Educated Patient    Methods Explanation;Handout    Comprehension Verbalized understanding              PT Short Term Goals - 10/06/20 0837       PT SHORT TERM GOAL #1   Title [name] [name] will be >75% HEP compliant throughout therapy to improve carryover between sessions and facilitate independent management of condition.    Status Achieved    Target Date 10/13/20               PT Long Term Goals - 10/06/20 0916       PT LONG TERM GOAL #1   Title [name] [name] will be >75% HEP compliant throughout therapy to improve carryover between sessions and facilitate independent management of condition  target date: 11/17/2020  Status Achieved      PT LONG TERM GOAL #2   Title [name] [name] will improve 90-90 HS test to lacking 70 degrees from full knee extension (<20 degrees considered normal).  Eval:  L 95 degrees  target date: 11/17/20      PT LONG TERM GOAL #3   Title [name] [name] will be able to complete work with pain level <3/10  EVAL: pain as high a 7/10  target date: 11/17/2020    Baseline 9/20: MET    Status Achieved      PT LONG TERM GOAL #4   Title [name] [name] will be able to demonstrate forward flexion to 25 cm with <3/10 pain, not limited by pain  EVAL: 25 cm with 7/10 pain  target date: 11/17/2020                   Plan - 10/06/20 0917     Clinical Impression Statement Pt reports no increase in baseline  pain following therapy  HEP was updated and reissued to patient; pt educated on HEP, was provided handout, and verbally confirmed understanding of exercises.  Overall, [name] [name] is progressing very well with therapy. Today we concentrated on core strengthening and hip strengthening. Pt is doing much better. We will hold PT for right now and let her self manage. I will hold D/C for now in case she has a recurrence of pain in the next few weeks and needs to come back in.    Stability/Clinical Decision Making Stable/Uncomplicated    Rehab Potential Good    PT Frequency 2x / week    PT Duration 8 weeks    PT Treatment/Interventions ADLs/Self Care Home Management;Aquatic Therapy;Iontophoresis 4mg /ml Dexamethasone;Therapeutic activities;Therapeutic exercise;Neuromuscular re-education;Manual techniques;Moist Heat;Dry needling;Spinal Manipulations;Joint Manipulations;Vasopneumatic Device    PT Next Visit Plan ext based progression, take FOTO, core/hip progression, lifting mechanics education    PT Home Exercise Plan [id number]    Consulted and Agree with Plan of Care Patient             Patient will benefit from skilled therapeutic intervention in order to improve the following deficits and impairments:  Pain, Decreased range of motion, Impaired flexibility  Visit Diagnosis: Left-sided low back pain with left-sided sciatica, unspecified chronicity     Problem List Patient Active Problem List   Diagnosis Date Noted   Hyperlipidemia associated with type 2 diabetes mellitus ([place]) 06/06/2019   Screening breast examination 09/27/2018   Obesity (BMI 30-39.9) 09/29/2016   NAFL (nonalcoholic fatty liver) 69/67/8938   Irritant contact dermatitis due to other chemical products 05/20/2016   Uncontrolled type 2 diabetes mellitus ([place]) 10/21/2014   Hyperlipidemia 11/04/2013   Environmental allergies 07/12/2013   Bell's palsy 11/06/2012   GERD 11/04/2005    Mathis Dad,  PT 10/06/2020, 9:17 AM  [facility] [address] [place], [place], [address] Phone: [phone]   Fax:  [phone]  Name: [name] [name] MRN: [id number] Date of Birth: 05-May-1960

## 2020-10-13 ENCOUNTER — Encounter: Payer: No Typology Code available for payment source | Admitting: Physical Therapy

## 2020-10-20 ENCOUNTER — Other Ambulatory Visit: Payer: Self-pay

## 2020-10-20 ENCOUNTER — Encounter: Payer: No Typology Code available for payment source | Admitting: Physical Therapy

## 2020-10-21 ENCOUNTER — Other Ambulatory Visit: Payer: Self-pay

## 2020-11-02 ENCOUNTER — Other Ambulatory Visit: Payer: Self-pay

## 2020-11-03 ENCOUNTER — Other Ambulatory Visit: Payer: Self-pay

## 2020-11-04 ENCOUNTER — Other Ambulatory Visit: Payer: Self-pay | Admitting: Family Medicine

## 2020-11-04 DIAGNOSIS — E119 Type 2 diabetes mellitus without complications: Secondary | ICD-10-CM

## 2020-11-05 ENCOUNTER — Other Ambulatory Visit: Payer: Self-pay

## 2020-11-05 MED ORDER — TRUE METRIX BLOOD GLUCOSE TEST VI STRP
ORAL_STRIP | 10 refills | Status: DC
  Filled 2020-11-05: qty 100, 25d supply, fill #0

## 2020-11-05 NOTE — Telephone Encounter (Signed)
Requested Prescriptions  Pending Prescriptions Disp Refills  . glucose blood (TRUE METRIX BLOOD GLUCOSE TEST) test strip 100 each 10    Sig: Use as instructed     Endocrinology: Diabetes - Testing Supplies Passed - 11/04/2020  9:17 PM      Passed - Valid encounter within last 12 months    Recent Outpatient Visits          2 months ago Type 2 diabetes mellitus with obesity (HCC)   [place] [facility] Jonah Blue B, MD   6 months ago Type 2 diabetes mellitus with obesity [facility])   [place] [facility] Jonah Blue B, MD   8 months ago Uncontrolled type 2 diabetes mellitus with hyperglycemia [facility])   [place] [facility] [place], Jeannett Senior L, RPH-CPP   8 months ago Uncontrolled type 2 diabetes mellitus with hyperglycemia [facility])   [place] [facility] [place], Jeannett Senior L, RPH-CPP   10 months ago Controlled type 2 diabetes mellitus without complication, without long-term current use of insulin [facility])    [facility] [place], Valetta Mole, MD      Future Appointments            In 2 months Laural Benes, Binnie Rail, MD [facility]

## 2020-11-18 ENCOUNTER — Other Ambulatory Visit: Payer: Self-pay

## 2020-11-18 ENCOUNTER — Other Ambulatory Visit: Payer: Self-pay | Admitting: Internal Medicine

## 2020-11-18 DIAGNOSIS — E669 Obesity, unspecified: Secondary | ICD-10-CM

## 2020-11-18 DIAGNOSIS — E1169 Type 2 diabetes mellitus with other specified complication: Secondary | ICD-10-CM

## 2020-11-18 MED ORDER — TRULICITY 1.5 MG/0.5ML ~~LOC~~ SOAJ
1.5000 mg | SUBCUTANEOUS | 6 refills | Status: DC
Start: 2020-11-18 — End: 2021-06-10
  Filled 2020-11-18: qty 2, 28d supply, fill #0
  Filled 2020-12-15: qty 2, 28d supply, fill #1
  Filled 2021-01-17 – 2021-01-18 (×2): qty 2, 28d supply, fill #2
  Filled 2021-02-15: qty 2, 28d supply, fill #0
  Filled 2021-02-15: qty 2, 28d supply, fill #3
  Filled 2021-03-16: qty 2, 28d supply, fill #1
  Filled 2021-04-14: qty 2, 28d supply, fill #2
  Filled 2021-05-11: qty 2, 28d supply, fill #3

## 2020-11-18 NOTE — Telephone Encounter (Signed)
Requested Prescriptions  Pending Prescriptions Disp Refills  . Dulaglutide (TRULICITY) 1.5 MG/0.5ML SOPN 2 mL 6    Sig: INJECT 1.5 MG INTO THE SKIN ONCE A WEEK.     Endocrinology:  Diabetes - GLP-1 Receptor Agonists Failed - 11/18/2020 11:51 AM      Failed - HBA1C is between 0 and 7.9 and within 180 days    HbA1c, POC (controlled diabetic range)  Date Value Ref Range Status  05/05/2020 6.6 0.0 - 7.0 % Final         Passed - Valid encounter within last 6 months    Recent Outpatient Visits          2 months ago Type 2 diabetes mellitus with obesity (HCC)   [place] [facility] Jonah Blue B, MD   6 months ago Type 2 diabetes mellitus with obesity [facility])   [place] [facility] Jonah Blue B, MD   8 months ago Uncontrolled type 2 diabetes mellitus with hyperglycemia [facility])   [place] [facility] [place], Jeannett Senior L, RPH-CPP   9 months ago Uncontrolled type 2 diabetes mellitus with hyperglycemia [facility])   [place] [facility] [place], Jeannett Senior L, RPH-CPP   10 months ago Controlled type 2 diabetes mellitus without complication, without long-term current use of insulin [facility])   [place] [facility] [place], Valetta Mole, MD      Future Appointments            In 1 month Laural Benes, Binnie Rail, MD [facility]

## 2020-11-19 ENCOUNTER — Other Ambulatory Visit: Payer: Self-pay

## 2020-11-24 ENCOUNTER — Other Ambulatory Visit: Payer: Self-pay

## 2020-11-24 DIAGNOSIS — Z1231 Encounter for screening mammogram for malignant neoplasm of breast: Secondary | ICD-10-CM

## 2020-12-03 ENCOUNTER — Other Ambulatory Visit: Payer: Self-pay

## 2020-12-03 ENCOUNTER — Ambulatory Visit
Admission: RE | Admit: 2020-12-03 | Discharge: 2020-12-03 | Disposition: A | Discharge status: Home / Self Care | Payer: No Typology Code available for payment source | Source: Ambulatory Visit | Attending: Internal Medicine | Admitting: Internal Medicine

## 2020-12-03 DIAGNOSIS — Z1231 Encounter for screening mammogram for malignant neoplasm of breast: Secondary | ICD-10-CM

## 2020-12-04 ENCOUNTER — Other Ambulatory Visit: Payer: Self-pay

## 2020-12-15 ENCOUNTER — Other Ambulatory Visit: Payer: Self-pay | Admitting: Internal Medicine

## 2020-12-15 ENCOUNTER — Other Ambulatory Visit: Payer: Self-pay

## 2020-12-15 DIAGNOSIS — K219 Gastro-esophageal reflux disease without esophagitis: Secondary | ICD-10-CM

## 2020-12-16 ENCOUNTER — Other Ambulatory Visit: Payer: Self-pay

## 2020-12-16 MED ORDER — OMEPRAZOLE 20 MG PO CPDR
DELAYED_RELEASE_CAPSULE | Freq: Every day | ORAL | 1 refills | Status: DC
  Filled 2020-12-16: qty 30, 30d supply, fill #0

## 2020-12-16 NOTE — Telephone Encounter (Signed)
Requested Prescriptions  Pending Prescriptions Disp Refills  . omeprazole (PRILOSEC) 20 MG capsule 30 capsule 1    Sig: Take 1 capsule by mouth daily.     Gastroenterology: Proton Pump Inhibitors Passed - 12/15/2020  8:24 AM      Passed - Valid encounter within last 12 months    Recent Outpatient Visits          3 months ago Type 2 diabetes mellitus with obesity (HCC)   [place] [facility] Jonah Blue B, MD   7 months ago Type 2 diabetes mellitus with obesity [facility])   [place] [facility] Jonah Blue B, MD   9 months ago Uncontrolled type 2 diabetes mellitus with hyperglycemia [facility])   [place] [facility] [place], Jeannett Senior L, RPH-CPP   10 months ago Uncontrolled type 2 diabetes mellitus with hyperglycemia [facility])   [place] [facility] [place], RPH-CPP   11 months ago Controlled type 2 diabetes mellitus without complication, without long-term current use of insulin [facility]   [place] [facility] [place], Valetta Mole, MD      Future Appointments            In 2 weeks Marcine Matar, MD [facility]

## 2020-12-21 ENCOUNTER — Other Ambulatory Visit: Payer: Self-pay

## 2021-01-05 ENCOUNTER — Ambulatory Visit: Payer: Self-pay | Admitting: Internal Medicine

## 2021-01-07 ENCOUNTER — Other Ambulatory Visit: Payer: Self-pay

## 2021-01-08 ENCOUNTER — Other Ambulatory Visit: Payer: Self-pay

## 2021-01-18 ENCOUNTER — Other Ambulatory Visit: Payer: Self-pay

## 2021-02-01 ENCOUNTER — Ambulatory Visit: Payer: No Typology Code available for payment source

## 2021-02-08 ENCOUNTER — Ambulatory Visit: Payer: No Typology Code available for payment source | Attending: Internal Medicine

## 2021-02-10 ENCOUNTER — Other Ambulatory Visit: Payer: Self-pay

## 2021-02-15 ENCOUNTER — Other Ambulatory Visit: Payer: Self-pay

## 2021-02-16 ENCOUNTER — Other Ambulatory Visit: Payer: Self-pay

## 2021-02-17 ENCOUNTER — Other Ambulatory Visit: Payer: Self-pay

## 2021-03-08 ENCOUNTER — Ambulatory Visit: Payer: Self-pay | Attending: Internal Medicine | Admitting: Internal Medicine

## 2021-03-08 ENCOUNTER — Encounter: Payer: Self-pay | Admitting: Internal Medicine

## 2021-03-08 ENCOUNTER — Other Ambulatory Visit: Payer: Self-pay

## 2021-03-08 VITALS — BP 119/73 | HR 84 | Resp 16 | Wt 181.6 lb

## 2021-03-08 DIAGNOSIS — E785 Hyperlipidemia, unspecified: Secondary | ICD-10-CM

## 2021-03-08 DIAGNOSIS — E669 Obesity, unspecified: Secondary | ICD-10-CM

## 2021-03-08 DIAGNOSIS — K219 Gastro-esophageal reflux disease without esophagitis: Secondary | ICD-10-CM

## 2021-03-08 DIAGNOSIS — E1169 Type 2 diabetes mellitus with other specified complication: Secondary | ICD-10-CM

## 2021-03-08 DIAGNOSIS — Z23 Encounter for immunization: Secondary | ICD-10-CM

## 2021-03-08 LAB — POCT GLYCOSYLATED HEMOGLOBIN (HGB A1C): HbA1c, POC (controlled diabetic range): 6.3 % (ref 0.0–7.0)

## 2021-03-08 LAB — GLUCOSE, POCT (MANUAL RESULT ENTRY): POC Glucose: 136 mg/dl — AB (ref 70–99)

## 2021-03-08 MED ORDER — METFORMIN HCL 1000 MG PO TABS
1000.0000 mg | ORAL_TABLET | Freq: Every day | ORAL | 3 refills | Status: DC
Start: 2021-03-08 — End: 2022-03-01
  Filled 2021-03-08: qty 30, 30d supply, fill #0
  Filled 2021-05-11: qty 30, 30d supply, fill #1
  Filled 2021-06-18: qty 30, 30d supply, fill #2
  Filled 2021-08-08: qty 30, 30d supply, fill #3
  Filled 2021-11-25: qty 30, 30d supply, fill #4
  Filled 2022-01-18 – 2022-01-19 (×2): qty 30, 30d supply, fill #5

## 2021-03-08 MED ORDER — TRUE METRIX BLOOD GLUCOSE TEST VI STRP
ORAL_STRIP | 10 refills | Status: DC
  Filled 2021-03-08: qty 100, 25d supply, fill #0
  Filled 2021-05-23: qty 100, 25d supply, fill #1
  Filled 2021-06-18: qty 100, 25d supply, fill #2
  Filled 2021-11-01: qty 100, 25d supply, fill #3

## 2021-03-08 MED ORDER — TRUE METRIX METER W/DEVICE KIT
PACK | 0 refills | Status: AC
  Filled 2021-03-08: qty 1, 365d supply, fill #0

## 2021-03-08 MED ORDER — TRUEPLUS LANCETS 28G MISC
1 refills | Status: DC
  Filled 2021-03-08: qty 100, 25d supply, fill #0
  Filled 2021-11-01: qty 100, 25d supply, fill #1

## 2021-03-08 MED ORDER — OMEPRAZOLE 20 MG PO CPDR
DELAYED_RELEASE_CAPSULE | Freq: Every day | ORAL | 5 refills | Status: DC
  Filled 2021-03-08: qty 30, 30d supply, fill #0
  Filled 2021-08-08: qty 30, 30d supply, fill #1
  Filled 2021-09-13: qty 30, 30d supply, fill #2

## 2021-03-08 NOTE — Progress Notes (Signed)
Patient ID: [name] [name], female    DOB: Feb 27, 1960  MRN: [id number]  CC: chronic ds management  Subjective: [name] [name] is a 61 y.o. female who presents for chronic ds management Her concerns today include:  Pt with hx of DM type 2, obesity, NAFLD, HL, GERD.  DIABETES TYPE 2 Last A1C:   Results for orders placed or performed in visit on 03/08/21  POCT glucose (manual entry)  Result Value Ref Range   POC Glucose 136 (A) 70 - 99 mg/dl  POCT glycosylated hemoglobin (Hb A1C)  Result Value Ref Range   Hemoglobin A1C     HbA1c POC (<> result, manual entry)     HbA1c, POC (prediabetic range)     HbA1c, POC (controlled diabetic range) 6.3 0.0 - 7.0 %    Med Adherence:  [x]  Yes _trulicity and Metformin    Medication side effects:  []  Yes    [x]  No Home Monitoring?  [x]  Yes  -having to use her husband's glucometer as her own stopped working Home glucose results range: 85-150 Diet Adherence: [x]  Yes    []  No Exercise: [x]  Yes - walking daily.  Weight is down about 17 to 18 pounds since being on Trulicity.    []  No Hypoglycemic episodes?: []  Yes    [x]  No Numbness of the feet? []  Yes    []  No Retinopathy hx? []  Yes    []  No Last eye exam: over due for eye exam.  No insurance Comments:  HL taking and tolerating Lipitor GERD: Doing well on omeprazole.  Requests refill.  HM:  due for Shingrix and flu shot.  Agrees to receive both today.   Patient Active Problem List   Diagnosis Date Noted   Hyperlipidemia associated with type 2 diabetes mellitus ([place]) 06/06/2019   Screening breast examination 09/27/2018   Obesity (BMI 30-39.9) 09/29/2016   NAFL (nonalcoholic fatty liver) 28/41/3244   Irritant contact dermatitis due to other chemical products 05/20/2016   Uncontrolled type 2 diabetes mellitus 10/21/2014   Hyperlipidemia 11/04/2013   Environmental allergies 07/12/2013   Bell's palsy 11/06/2012   GERD 11/04/2005     Current Outpatient Medications on  File Prior to Visit  Medication Sig Dispense Refill   aspirin 325 MG tablet Take 1 tablet (325 mg total) by mouth daily. 30 tablet 0   atorvastatin (LIPITOR) 40 MG tablet Take 1 tablet (40 mg total) by mouth daily. 90 tablet 3   atorvastatin (LIPITOR) 40 MG tablet Take 1 tablet (40 mg total) by mouth daily. 90 tablet 3   Blood Glucose Monitoring Suppl (TRUE METRIX METER) w/Device KIT Use as directed 1 kit 0   cetirizine (ZYRTEC) 10 MG tablet Take 1 tablet (10 mg total) by mouth 2 (two) times daily. 60 tablet 5   Dulaglutide (TRULICITY) 1.5 WN/0.2VO SOPN INJECT 1.5 MG INTO THE SKIN ONCE A WEEK. 2 mL 6   glucose blood (TRUE METRIX BLOOD GLUCOSE TEST) test strip Use as instructed 100 each 10   ketotifen (ZADITOR) 0.025 % ophthalmic solution Place 1 drop into the right eye 2 (two) times daily. 10 mL 1   meloxicam (MOBIC) 7.5 MG tablet Take 1 tablet (7.5 mg total) by mouth daily. 30 tablet 4   metFORMIN (GLUCOPHAGE) 1000 MG tablet Take 1 tablet (1,000 mg total) by mouth daily with breakfast. 90 tablet 3   omeprazole (PRILOSEC) 20 MG capsule Take 1 capsule by mouth daily. 30 capsule 1   TRUEPLUS LANCETS 28G MISC Use as directed  100 each 1   [DISCONTINUED] sitaGLIPtin (JANUVIA) 100 MG tablet Take 1 tablet (100 mg total) by mouth daily. 30 tablet 6   No current facility-administered medications on file prior to visit.    No Known Allergies  Social History   Socioeconomic History   Marital status: Married    Spouse name: Not on file   Number of children: 3   Years of education: Not on file   Highest education level: 9th grade  Occupational History   Not on file  Tobacco Use   Smoking status: Never   Smokeless tobacco: Never  Vaping Use   Vaping Use: Never used  Substance and Sexual Activity   Alcohol use: No   Drug use: No   Sexual activity: Yes    Birth control/protection: None, Surgical  Other Topics Concern   Not on file  Social History Narrative   Not on file   Social  Determinants of Health   Financial Resource Strain: Not on file  Food Insecurity: Not on file  Transportation Needs: Not on file  Physical Activity: Not on file  Stress: Not on file  Social Connections: Not on file  Intimate Partner Violence: Not on file    Family History  Problem Relation Age of Onset   Hypertension Mother    Diabetes Mother    Diabetes Father    Diabetes Sister    Diabetes Sister    Diabetes Sister    Diabetes Sister    Diabetes Brother    Colon cancer Neg Hx    Breast cancer Neg Hx     Past Surgical History:  Procedure Laterality Date   TUBAL LIGATION  1995    ROS: Review of Systems Negative except as stated above  PHYSICAL EXAM: BP 119/73    Pulse 84    Resp 16    Wt 181 lb 9.6 oz (82.4 kg)    SpO2 97%    BMI 34.31 kg/m   Wt Readings from Last 3 Encounters:  03/08/21 181 lb 9.6 oz (82.4 kg)  09/04/20 182 lb 12.8 oz (82.9 kg)  05/05/20 189 lb 4 oz (85.8 kg)    Physical Exam  General appearance - alert, well appearing, and in no distress Mental status - normal mood, behavior, speech, dress, motor activity, and thought processes Neck - supple, no significant adenopathy Chest - clear to auscultation, no wheezes, rales or rhonchi, symmetric air entry Heart - normal rate, regular rhythm, normal S1, S2, no murmurs, rubs, clicks or gallops Extremities - peripheral pulses normal, no pedal edema, no clubbing or cyanosis  Results for orders placed or performed in visit on 03/08/21  POCT glucose (manual entry)  Result Value Ref Range   POC Glucose 136 (A) 70 - 99 mg/dl  POCT glycosylated hemoglobin (Hb A1C)  Result Value Ref Range   Hemoglobin A1C     HbA1c POC (<> result, manual entry)     HbA1c, POC (prediabetic range)     HbA1c, POC (controlled diabetic range) 6.3 0.0 - 7.0 %    CMP Latest Ref Rng & Units 05/05/2020 06/06/2019 08/27/2018  Glucose 65 - 99 mg/dL 127(H) 294(H) 133(H)  BUN 6 - 24 mg/dL $Remove'11 11 11  'tIYLoOV$ Creatinine 0.57 - 1.00 mg/dL 0.60  0.64 0.58  Sodium 134 - 144 mmol/L 142 139 141  Potassium 3.5 - 5.2 mmol/L 4.2 4.0 4.5  Chloride 96 - 106 mmol/L 102 98 104  CO2 20 - 29 mmol/L $RemoveB'23 23 24  'ZtdNfpjF$ Calcium 8.7 -  10.2 mg/dL 9.6 9.6 9.5  Total Protein 6.0 - 8.5 g/dL 7.2 7.2 -  Total Bilirubin 0.0 - 1.2 mg/dL 0.7 0.7 -  Alkaline Phos 44 - 121 IU/L 109 108 -  AST 0 - 40 IU/L 17 35 -  ALT 0 - 32 IU/L 22 42(H) -   Lipid Panel     Component Value Date/Time   CHOL 254 (H) 05/05/2020 1215   TRIG 282 (H) 05/05/2020 1215   HDL 48 05/05/2020 1215   CHOLHDL 5.3 (H) 05/05/2020 1215   CHOLHDL 4.0 06/04/2014 1055   VLDL 40 06/04/2014 1055   LDLCALC 154 (H) 05/05/2020 1215    CBC    Component Value Date/Time   WBC 5.9 05/05/2020 1215   WBC 6.2 07/12/2013 1253   RBC 4.66 05/05/2020 1215   RBC 4.72 07/12/2013 1253   HGB 13.8 05/05/2020 1215   HCT 40.5 05/05/2020 1215   PLT 287 05/05/2020 1215   MCV 87 05/05/2020 1215   MCH 29.6 05/05/2020 1215   MCH 28.8 07/12/2013 1253   MCHC 34.1 05/05/2020 1215   MCHC 34.6 07/12/2013 1253   RDW 13.0 05/05/2020 1215   LYMPHSABS 2.3 07/12/2013 1253   MONOABS 0.6 07/12/2013 1253   EOSABS 0.2 07/12/2013 1253   BASOSABS 0.0 07/12/2013 1253    ASSESSMENT AND PLAN: 1. Type 2 diabetes mellitus with obesity ([place]) Continue metformin and Trulicity.  Commended her on weight loss.  Continue healthy eating habits and regular exercise.  Advised to get eye exam at least once a year when she can afford. - POCT glucose (manual entry) - POCT glycosylated hemoglobin (Hb A1C) - CBC - Comprehensive metabolic panel - Lipid panel - Blood Glucose Monitoring Suppl (TRUE METRIX METER) w/Device KIT; Use as directed  Dispense: 1 kit; Refill: 0 - glucose blood (TRUE METRIX BLOOD GLUCOSE TEST) test strip; Use as instructed  Dispense: 100 each; Refill: 10 - TRUEplus Lancets 28G MISC; Use as directed  Dispense: 100 each; Refill: 1 - metFORMIN (GLUCOPHAGE) 1000 MG tablet; Take 1 tablet (1,000 mg total) by mouth daily  with breakfast.  Dispense: 90 tablet; Refill: 3  2. Hyperlipidemia associated with type 2 diabetes mellitus (HCC) Continue atorvastatin.  3. Need for influenza vaccination Given today.  4. Need for shingles vaccine Patient prefers to come back in 2 weeks to have the shingles vaccine.  We will have her schedule with the clinical pharmacist.  5. GERD without esophagitis - omeprazole (PRILOSEC) 20 MG capsule; Take 1 capsule by mouth daily.  Dispense: 30 capsule; Refill: 5  6. Need for immunization against influenza - Flu Vaccine QUAD 71mo+IM (Fluarix, Fluzone & Alfiuria Quad PF)    [employer] Language interpreter used during this encounter. # Wynetta Fines, [phone]  Patient was given the opportunity to ask questions.  Patient verbalized understanding of the plan and was able to repeat key elements of the plan.   Orders Placed This Encounter  Procedures   POCT glucose (manual entry)   POCT glycosylated hemoglobin (Hb A1C)     Requested Prescriptions    No prescriptions requested or ordered in this encounter    No follow-ups on file.  Karle Plumber, MD, FACP

## 2021-03-09 ENCOUNTER — Other Ambulatory Visit: Payer: Self-pay

## 2021-03-09 LAB — COMPREHENSIVE METABOLIC PANEL
ALT: 22 IU/L (ref 0–32)
AST: 17 IU/L (ref 0–40)
Albumin/Globulin Ratio: 2 (ref 1.2–2.2)
Albumin: 4.7 g/dL (ref 3.8–4.9)
Alkaline Phosphatase: 106 IU/L (ref 44–121)
BUN/Creatinine Ratio: 23 (ref 12–28)
BUN: 15 mg/dL (ref 8–27)
Bilirubin Total: 0.9 mg/dL (ref 0.0–1.2)
CO2: 26 mmol/L (ref 20–29)
Calcium: 9.8 mg/dL (ref 8.7–10.3)
Chloride: 103 mmol/L (ref 96–106)
Creatinine, Ser: 0.64 mg/dL (ref 0.57–1.00)
Globulin, Total: 2.4 g/dL (ref 1.5–4.5)
Glucose: 107 mg/dL — ABNORMAL HIGH (ref 70–99)
Potassium: 4.3 mmol/L (ref 3.5–5.2)
Sodium: 142 mmol/L (ref 134–144)
Total Protein: 7.1 g/dL (ref 6.0–8.5)
eGFR: 101 mL/min/{1.73_m2} (ref >59)

## 2021-03-09 LAB — CBC
Hematocrit: 41 % (ref 34.0–46.6)
Hemoglobin: 13.7 g/dL (ref 11.1–15.9)
MCH: 29.2 pg (ref 26.6–33.0)
MCHC: 33.4 g/dL (ref 31.5–35.7)
MCV: 87 fL (ref 79–97)
Platelets: 269 10*3/uL (ref 150–450)
RBC: 4.69 x10E6/uL (ref 3.77–5.28)
RDW: 13.1 % (ref 11.7–15.4)
WBC: 7.2 10*3/uL (ref 3.4–10.8)

## 2021-03-09 LAB — LIPID PANEL
Chol/HDL Ratio: 3.1 ratio (ref 0.0–4.4)
Cholesterol, Total: 134 mg/dL (ref 100–199)
HDL: 43 mg/dL (ref >39)
LDL Chol Calc (NIH): 62 mg/dL (ref 0–99)
Triglycerides: 174 mg/dL — ABNORMAL HIGH (ref 0–149)
VLDL Cholesterol Cal: 29 mg/dL (ref 5–40)

## 2021-03-11 ENCOUNTER — Telehealth: Payer: Self-pay

## 2021-03-11 NOTE — Telephone Encounter (Signed)
Contacted pt to go over lab results pt didn't answer lvm  [employer] interpre[employer] [place]  Id# [phone]    Sent a CRM and forward labs to NT to give pt labs when they call back

## 2021-03-16 ENCOUNTER — Other Ambulatory Visit: Payer: Self-pay

## 2021-03-16 ENCOUNTER — Ambulatory Visit: Payer: Self-pay | Attending: Internal Medicine

## 2021-03-24 ENCOUNTER — Other Ambulatory Visit: Payer: Self-pay

## 2021-03-24 ENCOUNTER — Ambulatory Visit (HOSPITAL_COMMUNITY)
Admission: RE | Admit: 2021-03-24 | Discharge: 2021-03-24 | Disposition: A | Discharge status: Home / Self Care | Payer: No Typology Code available for payment source | Source: Ambulatory Visit | Attending: Physician Assistant | Admitting: Physician Assistant

## 2021-03-24 ENCOUNTER — Ambulatory Visit: Payer: Self-pay | Admitting: Physician Assistant

## 2021-03-24 ENCOUNTER — Ambulatory Visit: Payer: Self-pay | Admitting: *Deleted

## 2021-03-24 VITALS — BP 133/66 | HR 79 | Resp 18 | Ht 61.02 in | Wt 179.4 lb

## 2021-03-24 DIAGNOSIS — S92352A Displaced fracture of fifth metatarsal bone, left foot, initial encounter for closed fracture: Secondary | ICD-10-CM

## 2021-03-24 DIAGNOSIS — M79672 Pain in left foot: Secondary | ICD-10-CM

## 2021-03-24 DIAGNOSIS — M25572 Pain in left ankle and joints of left foot: Secondary | ICD-10-CM

## 2021-03-24 NOTE — Progress Notes (Signed)
Patient reports rolling her ankle to the left while walking yesterday. Patient presents with swelling and pain. Patient has used tylenol/ibuprofen with minimum relief. ?Patient has eaten and taken medication today. ? ?

## 2021-03-24 NOTE — Telephone Encounter (Signed)
?  Chief Complaint: Foot pain ?Symptoms: twisted ankle yesterday. No swelling 9/10 pain with weight bearing ?Frequency: yesterday ?Pertinent Negatives: Patient denies swelling, bruising ?Disposition: [] ED /[x] Urgent Care (no appt availability in office) / [] Appointment(In office/virtual)/ []  [place] [facility]/ [] Home Care/ [] Refused Recommended Disposition /[x] [place] [facility]/ []  Follow-up with PCP ?Additional Notes: states will follow disposition. ?Reason for Disposition ? [1] SEVERE pain (e.g., excruciating, unable to do any normal activities) AND [2] not improved after 2 hours of pain medicine ? ?Answer Assessment - Initial Assessment Questions ?1. ONSET: "When did the pain start?"  ?    Yesterday ?2. LOCATION: "Where is the pain located?"  ?    Left foot ?3. PAIN: "How bad is the pain?"    (Scale 1-10; or mild, moderate, severe) ? - MILD (1-3): doesn't interfere with normal activities.  ? - MODERATE (4-7): interferes with normal activities (e.g., work or school) or awakens from sleep, limping.  ? - SEVERE (8-10): excruciating pain, unable to do any normal activities, unable to walk.  ?    Painful to walk,9/10 ?4. WORK OR EXERCISE: "Has there been any recent work or exercise that involved this part of the body?"  ?    Twisted ankle when walking yesterday ?5. CAUSE: "What do you think is causing the foot pain?" ?    Twisted ?6. OTHER SYMPTOMS: "Do you have any other symptoms?" (e.g., leg pain, rash, fever, numbness) ? ?Protocols used: Foot Pain-A-AH ? ?

## 2021-03-24 NOTE — Progress Notes (Signed)
? ?Established Patient Office Visit ? ?Subjective:  ?Patient ID: [name] [name], female    DOB: 1960/02/10  Age: 61 y.o. MRN: [id number] ? ?CC:  ?Chief Complaint  ?Patient presents with  ? Foot Injury  ?   ?  ? ? ?HPI ?[name] [name] states that she "stepped on her left foot wrong "yesterday and twisted her ankle.  States that she has been having pain in the top of her left foot and along the outside, states that she has had swelling and color change.  States that she has been using a cane for assistance with walking.  Reports that she has been taking her already prescribed meloxicam and using ice and pain rubs with moderate relief.   ? ?Due to language barrier, an interpreter was present during the history-taking and subsequent discussion (and for part of the physical exam) with this patient. ? ? ?Past Medical History:  ?Diagnosis Date  ? Diabetes mellitus without complication ([place])   ? Hyperlipidemia   ? Medical history non-contributory   ? ? ?Past Surgical History:  ?Procedure Laterality Date  ? TUBAL LIGATION  1995  ? ? ?Family History  ?Problem Relation Age of Onset  ? Hypertension Mother   ? Diabetes Mother   ? Diabetes Father   ? Diabetes Sister   ? Diabetes Sister   ? Diabetes Sister   ? Diabetes Sister   ? Diabetes Brother   ? Colon cancer Neg Hx   ? Breast cancer Neg Hx   ? ? ?Social History  ? ?Socioeconomic History  ? Marital status: Married  ?  Spouse name: Not on file  ? Number of children: 3  ? Years of education: Not on file  ? Highest education level: 9th grade  ?Occupational History  ? Not on file  ?Tobacco Use  ? Smoking status: Never  ? Smokeless tobacco: Never  ?Vaping Use  ? Vaping Use: Never used  ?Substance and Sexual Activity  ? Alcohol use: No  ? Drug use: No  ? Sexual activity: Yes  ?  Birth control/protection: None, Surgical  ?Other Topics Concern  ? Not on file  ?Social History Narrative  ? Not on file  ? ?Social Determinants of Health  ? ?Financial Resource Strain:  Not on file  ?Food Insecurity: Not on file  ?Transportation Needs: Not on file  ?Physical Activity: Not on file  ?Stress: Not on file  ?Social Connections: Not on file  ?Intimate Partner Violence: Not on file  ? ? ?Outpatient Medications Prior to Visit  ?Medication Sig Dispense Refill  ? aspirin 325 MG tablet Take 1 tablet (325 mg total) by mouth daily. 30 tablet 0  ? atorvastatin (LIPITOR) 40 MG tablet Take 1 tablet (40 mg total) by mouth daily. 90 tablet 3  ? atorvastatin (LIPITOR) 40 MG tablet Take 1 tablet (40 mg total) by mouth daily. 90 tablet 3  ? Blood Glucose Monitoring Suppl (TRUE METRIX METER) w/Device KIT Use as directed 1 kit 0  ? cetirizine (ZYRTEC) 10 MG tablet Take 1 tablet (10 mg total) by mouth 2 (two) times daily. 60 tablet 5  ? Dulaglutide (TRULICITY) 1.5 GS/8.1JS SOPN INJECT 1.5 MG INTO THE SKIN ONCE A WEEK. 2 mL 6  ? glucose blood (TRUE METRIX BLOOD GLUCOSE TEST) test strip Use as instructed 100 each 10  ? ketotifen (ZADITOR) 0.025 % ophthalmic solution Place 1 drop into the right eye 2 (two) times daily. 10 mL 1  ? meloxicam (MOBIC) 7.5 MG tablet Take  1 tablet (7.5 mg total) by mouth daily. 30 tablet 4  ? metFORMIN (GLUCOPHAGE) 1000 MG tablet Take 1 tablet (1,000 mg total) by mouth daily with breakfast. 90 tablet 3  ? omeprazole (PRILOSEC) 20 MG capsule Take 1 capsule by mouth daily. 30 capsule 5  ? TRUEplus Lancets 28G MISC Use as directed 100 each 1  ? ?No facility-administered medications prior to visit.  ? ? ?No Known Allergies ? ?ROS ?Review of Systems  ?Constitutional: Negative.   ?HENT: Negative.    ?Eyes: Negative.   ?Respiratory:  Negative for shortness of breath.   ?Cardiovascular:  Negative for chest pain.  ?Endocrine: Negative.   ?Genitourinary: Negative.   ?Musculoskeletal:  Positive for gait problem.  ?Skin:  Positive for color change.  ?Allergic/Immunologic: Negative.   ?Neurological:  Negative for headaches.  ?Hematological: Negative.   ?Psychiatric/Behavioral: Negative.    ? ?   ?Objective:  ?  ?Physical Exam ?Vitals and nursing note reviewed.  ?Constitutional:   ?   Appearance: Normal appearance.  ?HENT:  ?   Head: Normocephalic and atraumatic.  ?   Right Ear: External ear normal.  ?   Left Ear: External ear normal.  ?   Nose: Nose normal.  ?   Mouth/Throat:  ?   Mouth: Mucous membranes are moist.  ?   Pharynx: Oropharynx is clear.  ?Eyes:  ?   Extraocular Movements: Extraocular movements intact.  ?   Conjunctiva/sclera: Conjunctivae normal.  ?   Pupils: Pupils are equal, round, and reactive to light.  ?Cardiovascular:  ?   Rate and Rhythm: Normal rate and regular rhythm.  ?   Pulses: Normal pulses.  ?   Heart sounds: Normal heart sounds.  ?Pulmonary:  ?   Effort: Pulmonary effort is normal.  ?   Breath sounds: Normal breath sounds.  ?Musculoskeletal:  ?   Cervical back: Normal range of motion and neck supple.  ?   Right foot: Normal.  ?   Left foot: Decreased range of motion. Normal capillary refill. Swelling present. Normal pulse.  ?   Comments: See photo  ?Skin: ?   General: Skin is warm and dry.  ?Neurological:  ?   General: No focal deficit present.  ?   Mental Status: She is alert and oriented to person, place, and time.  ?Psychiatric:     ?   Mood and Affect: Mood normal.     ?   Behavior: Behavior normal.     ?   Thought Content: Thought content normal.     ?   Judgment: Judgment normal.  ? ? ? ? ?Verbal consent given by patient to have photo included in chart ? ?BP 133/66 (BP Location: Left Arm, Patient Position: Sitting, Cuff Size: Normal)   Pulse 79   Resp 18   Ht 5' 1.02" (1.55 m)   Wt 179 lb 6.4 oz (81.4 kg)   SpO2 99%   BMI 33.87 kg/m?  ?Wt Readings from Last 3 Encounters:  ?03/24/21 179 lb 6.4 oz (81.4 kg)  ?03/08/21 181 lb 9.6 oz (82.4 kg)  ?09/04/20 182 lb 12.8 oz (82.9 kg)  ? ? ? ?Health Maintenance Due  ?Topic Date Due  ? COVID-19 Vaccine (1) Never done  ? Zoster Vaccines- Shingrix (1 of 2) Never done  ? OPHTHALMOLOGY EXAM  04/13/2018  ? URINE MICROALBUMIN   05/05/2021  ? ? ?There are no preventive care reminders to display for this patient. ? ?Lab Results  ?Component Value Date  ?  TSH 1.730 06/16/2016  ? ?Lab Results  ?Component Value Date  ? WBC 7.2 03/08/2021  ? HGB 13.7 03/08/2021  ? HCT 41.0 03/08/2021  ? MCV 87 03/08/2021  ? PLT 269 03/08/2021  ? ?Lab Results  ?Component Value Date  ? NA 142 03/08/2021  ? K 4.3 03/08/2021  ? CO2 26 03/08/2021  ? GLUCOSE 107 (H) 03/08/2021  ? BUN 15 03/08/2021  ? CREATININE 0.64 03/08/2021  ? BILITOT 0.9 03/08/2021  ? ALKPHOS 106 03/08/2021  ? AST 17 03/08/2021  ? ALT 22 03/08/2021  ? PROT 7.1 03/08/2021  ? ALBUMIN 4.7 03/08/2021  ? CALCIUM 9.8 03/08/2021  ? EGFR 101 03/08/2021  ? ?Lab Results  ?Component Value Date  ? CHOL 134 03/08/2021  ? ?Lab Results  ?Component Value Date  ? HDL 43 03/08/2021  ? ?Lab Results  ?Component Value Date  ? [place] 62 03/08/2021  ? ?Lab Results  ?Component Value Date  ? TRIG 174 (H) 03/08/2021  ? ?Lab Results  ?Component Value Date  ? CHOLHDL 3.1 03/08/2021  ? ?Lab Results  ?Component Value Date  ? HGBA1C 6.3 03/08/2021  ? ? ?  ?Assessment & Plan:  ? ?Problem List Items Addressed This Visit   ?None ?Visit Diagnoses   ? ? Acute foot pain, left    -  Primary  ? Relevant Orders  ? DG Foot Complete Left  ? Acute left ankle pain      ? Relevant Orders  ? DG Ankle Complete Left  ? ?  ? ? ?No orders of the defined types were placed in this encounter. ?1. Acute foot pain, left ?Patient education given on supportive care, continue previously prescribed meloxicam as directed.  Red flags given for prompt reevaluation ?- DG Foot Complete Left; Future ? ?2. Acute left ankle pain ? ?- DG Ankle Complete Left; Future ? ? ? ?I have reviewed the patient's medical history (PMH, PSH, Social History, Family History, Medications, and allergies) , and have been updated if relevant. I spent 25 minutes reviewing chart and  face to face time with patient. ? ? ? ?Follow-up: Return if symptoms worsen or fail to improve.   ? ? ?Zaya Kessenich S Mayers, PA-C ?

## 2021-03-24 NOTE — Patient Instructions (Signed)
Please have xrays completed, we will call you with the results ? ?Roney Jaffe, PA-C ?Physician Assistant ?[facility] ?[email or web address] ? ? ?Dolor del pie ?Foot Pain ?El dolor del pie puede tener muchas causas. Algunas causas frecuentes son las siguientes: ?Una lesi?n. ?Un esguince. ?Artritis. ?A[place]ollas. ?Juanetes. ?Siga estas instrucciones en su casa: ?Control del dolor, la rigidez y la hinchaz?n ?Si se lo indican, aplique hielo sobre la zona del dolor: ?Ponga el hielo en una bolsa pl?stica. ?Coloque una [employer] piel y Copy. ?Aplique el hielo durante 20 minutos, 2 o 3 veces por d?a. ? ?Actividad ?No permanezca de pie ni camine durante largos per?odos. ?Retome sus actividades normales seg?n lo indicado por el m?dico. Preg?ntele al m?dico qu? actividades son seguras para usted. ?Haga ejercicios de estiramiento para Engineer, materials del pie y la rigidez como se lo haya indicado el m?dico. ?No levante ning?n objeto que pese m?s de 10 libras (4.5 kg) o que supere el l?mite de [employer] hayan indicado, hasta que el m?dico le diga que puede [place]. Levantar mucho peso puede ejercer presi?n [employer]. ?Estilo de vida ?Use zapatos c?modos y anat?micos que tengan buen calce. No use zapatos con tacones altos. ?Mantenga los pies limpios y secos. ?Instrucciones generales ?Use los medicamentos de venta libre y los recetados solamente como se lo haya indicado el m?dico. ?H?gase masajes suaves en el pie. ?Est? atento a cualquier cambio en los s?ntomas. ?Concurra a todas las visitas de seguimiento como se lo haya indicado el m?dico. Esto es importante. ?Comun?quese con un m?dico si: ?El dolor no mejora despu?s de unos d?as de cuidados personales. ?El Product/process development scientist. ?No puede apoyar el peso en el pie. ?Solicite ayuda de inmediato si: ?El pie se le adormece o tiene hormigueo. ?El pie o los dedos de ese pie se le hinchan. ?El pie o los dedos de  ese pie se tornan de color blanco o [place]. ?Tiene el pie enrojecido y caliente al tacto. ?Resumen ?Las causas frecuentes del dolor de pie son [name] lesi?n, un esguince, artritis, ampollas o juanetes. ?El hielo, los medicamentos y los zapatos c?modos pueden Engineer, materials. ?Comun?quese con el m?dico si el dolor no mejora despu?s de unos d?as de cuidados personales. ?Esta informaci?n no tiene Theme park manager el consejo del m?dico. Aseg?rese de hacerle al m?dico cualquier pregunta que tenga. ?Document Revised: 05/05/2020 Document Reviewed: 05/05/2020 ?Elsevier Patient Education ? 2022 Elsevier Inc. ? ?

## 2021-03-25 ENCOUNTER — Telehealth: Payer: Self-pay

## 2021-03-25 NOTE — Addendum Note (Signed)
Addended by: Kennieth Rad on: 03/25/2021 09:12 AM ? ? Modules accepted: Orders ? ?

## 2021-03-29 ENCOUNTER — Other Ambulatory Visit: Payer: Self-pay

## 2021-04-01 ENCOUNTER — Ambulatory Visit (INDEPENDENT_AMBULATORY_CARE_PROVIDER_SITE_OTHER): Payer: Self-pay | Admitting: Orthopedic Surgery

## 2021-04-01 DIAGNOSIS — S92352A Displaced fracture of fifth metatarsal bone, left foot, initial encounter for closed fracture: Secondary | ICD-10-CM

## 2021-04-04 ENCOUNTER — Encounter: Payer: Self-pay | Admitting: Orthopedic Surgery

## 2021-04-04 NOTE — Progress Notes (Signed)
? ?Office Visit Note ?  ?Patient: [name] [name]           ?Date of Birth: Mar 03, 1960           ?MRN: [id number] ?Visit Date: 04/01/2021 ?             ?Requested by: Mayers, Kasandra Knudsen, PA-C ?[id number] ?[address] ?[place],  [place] [address] ?PCP: Marcine Matar, MD ? ?Chief Complaint  ?Patient presents with  ? Left Foot - Pain  ?  Left foot closed fx base 5th MT DOI 03/23/21  ? ? ? ? ?HPI: ?Patient is a 61 year old presents for initial evaluation for a metaphyseal fracture base of the fifth metatarsal left foot.  Injury occurred about 2 weeks ago.  Patient states she sustained an injury while working with a twisting injury to her left foot. ? ?Assessment & Plan: ?Visit Diagnoses:  ?1. Closed fracture of base of fifth metatarsal bone of left foot, initial encounter   ? ? ?Plan: Patient is given a postoperative shoe weightbearing as tolerated she is given a note to be out of work for 2 weeks.  Plan to follow-up in 4 weeks with three-view radiographs of the left foot. ? ?Follow-Up Instructions: Return in about 4 weeks (around 04/29/2021).  ? ?Ortho Exam ? ?Patient is alert, oriented, no adenopathy, well-dressed, normal affect, normal respiratory effort. ?Examination patient has a good dorsalis pedis pulse she has good ankle and subtalar motion she is tender to palpation over the base of the fifth metatarsal.  Radiographs show a nondisplaced metaphyseal fracture of the base of the fifth metatarsal. ? ?Imaging: ?No results found. ?No images are attached to the encounter. ? ?Labs: ?Lab Results  ?Component Value Date  ? HGBA1C 6.3 03/08/2021  ? HGBA1C 6.6 05/05/2020  ? HGBA1C 8.5 (A) 01/07/2020  ? ? ? ?Lab Results  ?Component Value Date  ? ALBUMIN 4.7 03/08/2021  ? ALBUMIN 4.8 05/05/2020  ? ALBUMIN 4.7 06/06/2019  ? ? ?No results found for: MG ?Lab Results  ?Component Value Date  ? VD25OH 24 (L) 07/12/2013  ? ? ?No results found for: PREALBUMIN ?CBC EXTENDED Latest Ref Rng & Units 03/08/2021 05/05/2020 06/06/2019   ?WBC 3.4 - 10.8 x10E3/uL 7.2 5.9 5.6  ?RBC 3.77 - 5.28 x10E6/uL 4.69 4.66 4.88  ?HGB 11.1 - 15.9 g/dL 35.5 73.2 20.2  ?HCT 34.0 - 46.6 % 41.0 40.5 42.9  ?PLT 150 - 450 x10E3/uL 269 287 263  ?NEUTROABS 1.7 - 7.7 K/uL - - -  ?LYMPHSABS 0.7 - 4.0 K/uL - - -  ? ? ? ?There is no height or weight on file to calculate BMI. ? ?Orders:  ?No orders of the defined types were placed in this encounter. ? ?No orders of the defined types were placed in this encounter. ? ? ? Procedures: ?No procedures performed ? ?Clinical Data: ?No additional findings. ? ?ROS: ? ?All other systems negative, except as noted in the HPI. ?Review of Systems ? ?Objective: ?Vital Signs: There were no vitals taken for this visit. ? ?Specialty Comments:  ?No specialty comments available. ? ?PMFS History: ?Patient Active Problem List  ? Diagnosis Date Noted  ? Hyperlipidemia associated with type 2 diabetes mellitus ([facility]) 06/06/2019  ? Screening breast examination 09/27/2018  ? Obesity (BMI 30-39.9) 09/29/2016  ? NAFL (nonalcoholic fatty liver) 06/16/2016  ? Irritant contact dermatitis due to other chemical products 05/20/2016  ? Uncontrolled type 2 diabetes mellitus 10/21/2014  ? Hyperlipidemia 11/04/2013  ? Environmental allergies 07/12/2013  ?  Bell's palsy 11/06/2012  ? GERD 11/04/2005  ? ?Past Medical History:  ?Diagnosis Date  ? Diabetes mellitus without complication ([facility])   ? Hyperlipidemia   ? Medical history non-contributory   ?  ?Family History  ?Problem Relation Age of Onset  ? Hypertension Mother   ? Diabetes Mother   ? Diabetes Father   ? Diabetes Sister   ? Diabetes Sister   ? Diabetes Sister   ? Diabetes Sister   ? Diabetes Brother   ? Colon cancer Neg Hx   ? Breast cancer Neg Hx   ?  ?Past Surgical History:  ?Procedure Laterality Date  ? TUBAL LIGATION  1995  ? ?Social History  ? ?Occupational History  ? Not on file  ?Tobacco Use  ? Smoking status: Never  ? Smokeless tobacco: Never  ?Vaping Use  ? Vaping Use: Never used  ?Substance and  Sexual Activity  ? Alcohol use: No  ? Drug use: No  ? Sexual activity: Yes  ?  Birth control/protection: None, Surgical  ? ? ? ? ? ?

## 2021-04-05 ENCOUNTER — Ambulatory Visit: Payer: Self-pay | Attending: Internal Medicine | Admitting: Pharmacist

## 2021-04-05 DIAGNOSIS — Z23 Encounter for immunization: Secondary | ICD-10-CM

## 2021-04-05 NOTE — Progress Notes (Signed)
Patient presents for vaccination against zoster per orders of Dr. Johnson. Consent given. Counseling provided. No contraindications exists. Vaccine administered without incident.   Luke Van Ausdall, PharmD, BCACP, CPP Clinical Pharmacist [facility] [phone]  

## 2021-04-14 ENCOUNTER — Other Ambulatory Visit: Payer: Self-pay

## 2021-04-29 ENCOUNTER — Ambulatory Visit (INDEPENDENT_AMBULATORY_CARE_PROVIDER_SITE_OTHER): Payer: Self-pay

## 2021-04-29 ENCOUNTER — Ambulatory Visit (INDEPENDENT_AMBULATORY_CARE_PROVIDER_SITE_OTHER): Payer: No Typology Code available for payment source | Admitting: Orthopedic Surgery

## 2021-04-29 DIAGNOSIS — S92352A Displaced fracture of fifth metatarsal bone, left foot, initial encounter for closed fracture: Secondary | ICD-10-CM

## 2021-05-09 ENCOUNTER — Encounter: Payer: Self-pay | Admitting: Orthopedic Surgery

## 2021-05-09 NOTE — Progress Notes (Signed)
? ?Office Visit Note ?  ?Patient: [name] [name]           ?Date of Birth: 13-Sep-1960           ?MRN: [id number] ?Visit Date: 04/29/2021 ?             ?Requested by: Marcine Matar, MD ?[address] ?[address] ?[place],  [place] [address] ?PCP: Marcine Matar, MD ? ?Chief Complaint  ?Patient presents with  ? Left Foot - Fracture, Follow-up  ?  Closed fracture at base of 5th MT  ? ? ? ? ?HPI: ?Patient is a 61 year old woman who presents 5 weeks status post closed avulsion fracture off the base of the fifth metatarsal.  She is been in a postop shoe weightbearing as tolerated. ? ?Assessment & Plan: ?Visit Diagnoses:  ?1. Closed fracture of base of fifth metatarsal bone of left foot, initial encounter   ? ? ?Plan: Patient is given a note for light duty work for 2 weeks and then advance her activities as tolerated. ? ?Follow-Up Instructions: Return if symptoms worsen or fail to improve.  ? ?Ortho Exam ? ?Patient is alert, oriented, no adenopathy, well-dressed, normal affect, normal respiratory effort. ?Examination the fracture site is not tender to palpation.  There is no redness no cellulitis no bruising.  She has a good pulse.  Patient will stay in her postoperative shoe for 2 more weeks. ? ?Imaging: ?No results found. ?No images are attached to the encounter. ? ?Labs: ?Lab Results  ?Component Value Date  ? HGBA1C 6.3 03/08/2021  ? HGBA1C 6.6 05/05/2020  ? HGBA1C 8.5 (A) 01/07/2020  ? ? ? ?Lab Results  ?Component Value Date  ? ALBUMIN 4.7 03/08/2021  ? ALBUMIN 4.8 05/05/2020  ? ALBUMIN 4.7 06/06/2019  ? ? ?No results found for: MG ?Lab Results  ?Component Value Date  ? VD25OH 24 (L) 07/12/2013  ? ? ?No results found for: PREALBUMIN ? ?  Latest Ref Rng & Units 03/08/2021  ?  4:57 PM 05/05/2020  ? 12:15 PM 06/06/2019  ? 11:12 AM  ?CBC EXTENDED  ?WBC 3.4 - 10.8 x10E3/uL 7.2   5.9   5.6    ?RBC 3.77 - 5.28 x10E6/uL 4.69   4.66   4.88    ?Hemoglobin 11.1 - 15.9 g/dL 43.3   29.5   18.8    ?HCT 34.0 - 46.6 % 41.0    40.5   42.9    ?Platelets 150 - 450 x10E3/uL 269   287   263    ? ? ? ?There is no height or weight on file to calculate BMI. ? ?Orders:  ?Orders Placed This Encounter  ?Procedures  ? XR Foot Complete Left  ? ?No orders of the defined types were placed in this encounter. ? ? ? Procedures: ?No procedures performed ? ?Clinical Data: ?No additional findings. ? ?ROS: ? ?All other systems negative, except as noted in the HPI. ?Review of Systems ? ?Objective: ?Vital Signs: There were no vitals taken for this visit. ? ?Specialty Comments:  ?No specialty comments available. ? ?PMFS History: ?Patient Active Problem List  ? Diagnosis Date Noted  ? Hyperlipidemia associated with type 2 diabetes mellitus (HCC) 06/06/2019  ? Screening breast examination 09/27/2018  ? Obesity (BMI 30-39.9) 09/29/2016  ? NAFL (nonalcoholic fatty liver) 06/16/2016  ? Irritant contact dermatitis due to other chemical products 05/20/2016  ? Uncontrolled type 2 diabetes mellitus 10/21/2014  ? Hyperlipidemia 11/04/2013  ? Environmental allergies 07/12/2013  ? Bell's  palsy 11/06/2012  ? GERD 11/04/2005  ? ?Past Medical History:  ?Diagnosis Date  ? Diabetes mellitus without complication (HCC)   ? Hyperlipidemia   ? Medical history non-contributory   ?  ?Family History  ?Problem Relation Age of Onset  ? Hypertension Mother   ? Diabetes Mother   ? Diabetes Father   ? Diabetes Sister   ? Diabetes Sister   ? Diabetes Sister   ? Diabetes Sister   ? Diabetes Brother   ? Colon cancer Neg Hx   ? Breast cancer Neg Hx   ?  ?Past Surgical History:  ?Procedure Laterality Date  ? TUBAL LIGATION  1995  ? ?Social History  ? ?Occupational History  ? Not on file  ?Tobacco Use  ? Smoking status: Never  ? Smokeless tobacco: Never  ?Vaping Use  ? Vaping Use: Never used  ?Substance and Sexual Activity  ? Alcohol use: No  ? Drug use: No  ? Sexual activity: Yes  ?  Birth control/protection: None, Surgical  ? ? ? ? ? ?

## 2021-05-11 ENCOUNTER — Other Ambulatory Visit: Payer: Self-pay | Admitting: Internal Medicine

## 2021-05-11 DIAGNOSIS — M5416 Radiculopathy, lumbar region: Secondary | ICD-10-CM

## 2021-05-12 ENCOUNTER — Other Ambulatory Visit: Payer: Self-pay

## 2021-05-12 MED ORDER — MELOXICAM 7.5 MG PO TABS
7.5000 mg | ORAL_TABLET | Freq: Every day | ORAL | 2 refills | Status: DC
  Filled 2021-05-12: qty 30, 30d supply, fill #0
  Filled 2021-06-18: qty 30, 30d supply, fill #1
  Filled 2021-11-25: qty 30, 30d supply, fill #2

## 2021-05-12 MED ORDER — ATORVASTATIN CALCIUM 40 MG PO TABS
40.0000 mg | ORAL_TABLET | Freq: Every day | ORAL | 2 refills | Status: DC
  Filled 2021-05-12: qty 30, 30d supply, fill #0
  Filled 2021-06-18: qty 30, 30d supply, fill #1
  Filled 2021-08-08: qty 30, 30d supply, fill #2

## 2021-05-13 ENCOUNTER — Other Ambulatory Visit: Payer: Self-pay

## 2021-05-24 ENCOUNTER — Other Ambulatory Visit: Payer: Self-pay

## 2021-05-25 ENCOUNTER — Ambulatory Visit: Payer: Self-pay | Attending: Internal Medicine | Admitting: Pharmacist

## 2021-05-25 DIAGNOSIS — Z23 Encounter for immunization: Secondary | ICD-10-CM

## 2021-05-25 NOTE — Progress Notes (Signed)
Patient presents for vaccination against zoster per orders of Dr. Johnson. Consent given. Counseling provided. No contraindications exists. Vaccine administered without incident.   Luke Van Ausdall, PharmD, BCACP, CPP Clinical Pharmacist [facility] [phone]  

## 2021-06-10 ENCOUNTER — Other Ambulatory Visit: Payer: Self-pay | Admitting: Internal Medicine

## 2021-06-10 DIAGNOSIS — E1169 Type 2 diabetes mellitus with other specified complication: Secondary | ICD-10-CM

## 2021-06-10 DIAGNOSIS — E669 Obesity, unspecified: Secondary | ICD-10-CM

## 2021-06-11 ENCOUNTER — Other Ambulatory Visit: Payer: Self-pay

## 2021-06-11 MED ORDER — TRULICITY 1.5 MG/0.5ML ~~LOC~~ SOAJ
1.5000 mg | SUBCUTANEOUS | 0 refills | Status: DC
  Filled 2021-06-11 – 2021-06-18 (×2): qty 2, 28d supply, fill #0

## 2021-06-18 ENCOUNTER — Other Ambulatory Visit: Payer: Self-pay

## 2021-06-22 ENCOUNTER — Other Ambulatory Visit: Payer: Self-pay

## 2021-06-28 ENCOUNTER — Encounter: Payer: Self-pay | Admitting: Internal Medicine

## 2021-06-28 ENCOUNTER — Ambulatory Visit: Payer: Self-pay | Attending: Internal Medicine | Admitting: Internal Medicine

## 2021-06-28 ENCOUNTER — Other Ambulatory Visit: Payer: Self-pay

## 2021-06-28 VITALS — BP 101/66 | HR 84 | Temp 98.1°F | Resp 16 | Wt 188.8 lb

## 2021-06-28 DIAGNOSIS — M545 Low back pain, unspecified: Secondary | ICD-10-CM

## 2021-06-28 DIAGNOSIS — E669 Obesity, unspecified: Secondary | ICD-10-CM

## 2021-06-28 DIAGNOSIS — E1169 Type 2 diabetes mellitus with other specified complication: Secondary | ICD-10-CM

## 2021-06-28 DIAGNOSIS — E785 Hyperlipidemia, unspecified: Secondary | ICD-10-CM

## 2021-06-28 LAB — POCT GLYCOSYLATED HEMOGLOBIN (HGB A1C): Hemoglobin A1C: 6.1 % — AB (ref 4.0–5.6)

## 2021-06-28 LAB — GLUCOSE, POCT (MANUAL RESULT ENTRY): POC Glucose: 132 mg/dl — AB (ref 70–99)

## 2021-06-28 MED ORDER — TRULICITY 1.5 MG/0.5ML ~~LOC~~ SOAJ
1.5000 mg | SUBCUTANEOUS | 6 refills | Status: DC
  Filled 2021-06-28 – 2021-07-11 (×2): qty 2, 28d supply, fill #0
  Filled 2021-08-08: qty 2, 28d supply, fill #1
  Filled 2021-09-05: qty 2, 28d supply, fill #2
  Filled 2021-10-05: qty 2, 28d supply, fill #3
  Filled 2021-10-28: qty 2, 28d supply, fill #4
  Filled 2021-11-25: qty 2, 28d supply, fill #5
  Filled 2021-12-30: qty 2, 28d supply, fill #6

## 2021-06-28 NOTE — Progress Notes (Signed)
Patient ID: [name] [name], female    DOB: Nov 08, 1960  MRN: [id number]  CC: Chronic disease management  Subjective: [name] [name] is a 61 y.o. female who presents for chronic ds management Her concerns today include:  Pt with hx of DM type 2, obesity, NAFLD, HL, GERD.   DM: Results for orders placed or performed in visit on 06/28/21  POCT glucose (manual entry)  Result Value Ref Range   POC Glucose 132 (A) 70 - 99 mg/dl  POCT glycosylated hemoglobin (Hb A1C)  Result Value Ref Range   Hemoglobin A1C 6.1 (A) 4.0 - 5.6 %   HbA1c POC (<> result, manual entry)     HbA1c, POC (prediabetic range)     HbA1c, POC (controlled diabetic range)    Taking Metformin and Trulicity Checks BS daily before BF.  Range 120-130s.  Highest was 160. Gained 7 lbs since last seen 02/2021.  She attributes this to taking a break from walking when she fractured toe LT foot 03/2021.  Just restarted walking QOD for 10 mins 2 weeks ago.  Feels she does okay with her eating habits without any major changes. Over due for DM eye exam.  No insurance   Taking and tolerating Lipitor for cholesterol.   C/o pain across mid to lower back x 1 wk No known initiating factors Intermittent Feels it when she bends over or stretch Endorses a little dysuria at times that goes away when she drinks water.   No fever Works in [employer].  Endorses job involves a lot of lifting and bending    Patient Active Problem List   Diagnosis Date Noted   Hyperlipidemia associated with type 2 diabetes mellitus ([place]) 06/06/2019   Screening breast examination 09/27/2018   Obesity (BMI 30-39.9) 09/29/2016   NAFL (nonalcoholic fatty liver) 93/79/0240   Irritant contact dermatitis due to other chemical products 05/20/2016   Uncontrolled type 2 diabetes mellitus 10/21/2014   Hyperlipidemia 11/04/2013   Environmental allergies 07/12/2013   Bell's palsy 11/06/2012   GERD 11/04/2005     Current  Outpatient Medications on File Prior to Visit  Medication Sig Dispense Refill   aspirin 325 MG tablet Take 1 tablet (325 mg total) by mouth daily. 30 tablet 0   atorvastatin (LIPITOR) 40 MG tablet Take 1 tablet (40 mg total) by mouth daily. 30 tablet 2   Blood Glucose Monitoring Suppl (TRUE METRIX METER) w/Device KIT Use as directed 1 kit 0   cetirizine (ZYRTEC) 10 MG tablet Take 1 tablet (10 mg total) by mouth 2 (two) times daily. 60 tablet 5   glucose blood (TRUE METRIX BLOOD GLUCOSE TEST) test strip Use as instructed 100 each 10   ketotifen (ZADITOR) 0.025 % ophthalmic solution Place 1 drop into the right eye 2 (two) times daily. 10 mL 1   meloxicam (MOBIC) 7.5 MG tablet Take 1 tablet (7.5 mg total) by mouth daily. 30 tablet 2   metFORMIN (GLUCOPHAGE) 1000 MG tablet Take 1 tablet (1,000 mg total) by mouth daily with breakfast. 90 tablet 3   omeprazole (PRILOSEC) 20 MG capsule Take 1 capsule by mouth daily. 30 capsule 5   TRUEplus Lancets 28G MISC Use as directed 100 each 1   [DISCONTINUED] sitaGLIPtin (JANUVIA) 100 MG tablet Take 1 tablet (100 mg total) by mouth daily. 30 tablet 6   No current facility-administered medications on file prior to visit.    No Known Allergies  Social History   Socioeconomic History   Marital status: Married  Spouse name: Not on file   Number of children: 3   Years of education: Not on file   Highest education level: 9th grade  Occupational History   Not on file  Tobacco Use   Smoking status: Never   Smokeless tobacco: Never  Vaping Use   Vaping Use: Never used  Substance and Sexual Activity   Alcohol use: No   Drug use: No   Sexual activity: Yes    Birth control/protection: None, Surgical  Other Topics Concern   Not on file  Social History Narrative   Not on file   Social Determinants of Health   Financial Resource Strain: Not on file  Food Insecurity: Not on file  Transportation Needs: No Transportation Needs (11/28/2019)   PRAPARE  - Hydrologist (Medical): No    Lack of Transportation (Non-Medical): No  Physical Activity: Not on file  Stress: Not on file  Social Connections: Not on file  Intimate Partner Violence: Not on file    Family History  Problem Relation Age of Onset   Hypertension Mother    Diabetes Mother    Diabetes Father    Diabetes Sister    Diabetes Sister    Diabetes Sister    Diabetes Sister    Diabetes Brother    Colon cancer Neg Hx    Breast cancer Neg Hx     Past Surgical History:  Procedure Laterality Date   TUBAL LIGATION  1995    ROS: Review of Systems Negative except as stated above  PHYSICAL EXAM: BP 101/66   Pulse 84   Temp 98.1 F (36.7 C) (Oral)   Resp 16   Wt 188 lb 12.8 oz (85.6 kg)   SpO2 98%   BMI 35.65 kg/m   Wt Readings from Last 3 Encounters:  06/28/21 188 lb 12.8 oz (85.6 kg)  03/24/21 179 lb 6.4 oz (81.4 kg)  03/08/21 181 lb 9.6 oz (82.4 kg)    Physical Exam  General appearance - alert, well appearing, and in no distress Mental status - normal mood, behavior, speech, dress, motor activity, and thought processes Neck - supple, no significant adenopathy Chest - clear to auscultation, no wheezes, rales or rhonchi, symmetric air entry Heart - normal rate, regular rhythm, normal S1, S2, no murmurs, rubs, clicks or gallops Musculoskeletal -no tenderness on palpation of both flanks and the thoracic and lumbar spines. Extremities - peripheral pulses normal, no pedal edema, no clubbing or cyanosis      Latest Ref Rng & Units 03/08/2021    4:57 PM 05/05/2020   12:15 PM 06/06/2019   11:12 AM  CMP  Glucose 70 - 99 mg/dL 107  127  294   BUN 8 - 27 mg/dL _0 Creatinine 0.57 - 1.00 mg/dL 0.64  0.60  0.64   Sodium 134 - 144 mmol/L 142  142  139   Potassium 3.5 - 5.2 mmol/L 4.3  4.2  4.0   Chloride 96 - 106 mmol/L 103  102  98   CO2 20 - 29 mmol/L _1 Calcium 8.7 - 10.3 mg/dL 9.8  9.6  9.6   Total Protein 6.0  - 8.5 g/dL 7.1  7.2  7.2   Total Bilirubin 0.0 - 1.2 mg/dL 0.9  0.7  0.7   Alkaline Phos 44 - 121 IU/L 106  109  108   AST 0 - 40 IU/L 17  17  35   ALT 0 - 32 IU/L 22  22  42    Lipid Panel     Component Value Date/Time   CHOL 134 03/08/2021 1657   TRIG 174 (H) 03/08/2021 1657   HDL 43 03/08/2021 1657   CHOLHDL 3.1 03/08/2021 1657   CHOLHDL 4.0 06/04/2014 1055   VLDL 40 06/04/2014 1055   LDLCALC 62 03/08/2021 1657    CBC    Component Value Date/Time   WBC 7.2 03/08/2021 1657   WBC 6.2 07/12/2013 1253   RBC 4.69 03/08/2021 1657   RBC 4.72 07/12/2013 1253   HGB 13.7 03/08/2021 1657   HCT 41.0 03/08/2021 1657   PLT 269 03/08/2021 1657   MCV 87 03/08/2021 1657   MCH 29.2 03/08/2021 1657   MCH 28.8 07/12/2013 1253   MCHC 33.4 03/08/2021 1657   MCHC 34.6 07/12/2013 1253   RDW 13.1 03/08/2021 1657   LYMPHSABS 2.3 07/12/2013 1253   MONOABS 0.6 07/12/2013 1253   EOSABS 0.2 07/12/2013 1253   BASOSABS 0.0 07/12/2013 1253    ASSESSMENT AND PLAN: 1. Type 2 diabetes mellitus with obesity (HCC) At goal.  Continue Trulicity and metformin. Continue healthy eating habits. Encouraged her to continue trying to ease back into her exercise given the recent setback she had with a fractured toe. Encouraged her to try to get an eye exam when she is able to afford. - POCT glucose (manual entry) - POCT glycosylated hemoglobin (Hb A1C) - Dulaglutide (TRULICITY) 1.60 YO/1.1WA SOPN; INJECT 1.5 MG INTO THE SKIN ONCE A WEEK.  Dispense: 2 mL; Refill: 6 - Microalbumin / creatinine urine ratio  2. Hyperlipidemia associated with type 2 diabetes mellitus (HCC) Continue atorvastatin.  Last A1c was at goal of 62.  3. Acute bilateral low back pain without sciatica This seems to be musculoskeletal in nature.  I recommend using some IcyHot rub over-the-counter along with Tylenol.  Also advise using IcyHot rub.  Try to avoid any heavy lifting for the next few weeks.    [employer] Language interpreter used  during this encounter. #[id number] Patient was given the opportunity to ask questions.  Patient verbalized understanding of the plan and was able to repeat key elements of the plan.   This documentation was completed using Radio producer.  Any transcriptional errors are unintentional.  Orders Placed This Encounter  Procedures   Microalbumin / creatinine urine ratio   POCT glucose (manual entry)   POCT glycosylated hemoglobin (Hb A1C)     Requested Prescriptions   Signed Prescriptions Disp Refills   Dulaglutide (TRULICITY) 1.5 GK/8.1PT SOPN 2 mL 6    Sig: INJECT 1.5 MG INTO THE SKIN ONCE A WEEK.    Return in about 4 months (around 10/28/2021).  Karle Plumber, MD, FACP

## 2021-06-29 LAB — MICROALBUMIN / CREATININE URINE RATIO
Creatinine, Urine: 21.1 mg/dL
Microalb/Creat Ratio: <14 mg/g creat (ref 0–29)
Microalbumin, Urine: <3 ug/mL

## 2021-07-12 ENCOUNTER — Other Ambulatory Visit: Payer: Self-pay

## 2021-08-09 ENCOUNTER — Other Ambulatory Visit: Payer: Self-pay

## 2021-09-06 ENCOUNTER — Other Ambulatory Visit: Payer: Self-pay

## 2021-09-13 ENCOUNTER — Other Ambulatory Visit: Payer: Self-pay | Admitting: Internal Medicine

## 2021-09-13 ENCOUNTER — Other Ambulatory Visit: Payer: Self-pay

## 2021-09-14 ENCOUNTER — Other Ambulatory Visit: Payer: Self-pay

## 2021-09-14 MED ORDER — ATORVASTATIN CALCIUM 40 MG PO TABS
40.0000 mg | ORAL_TABLET | Freq: Every day | ORAL | 2 refills | Status: DC
  Filled 2021-09-14: qty 30, 30d supply, fill #0

## 2021-09-14 NOTE — Telephone Encounter (Signed)
Requested Prescriptions  Pending Prescriptions Disp Refills  . atorvastatin (LIPITOR) 40 MG tablet 30 tablet 2    Sig: Take 1 tablet (40 mg total) by mouth daily.     Cardiovascular:  Antilipid - Statins Failed - 09/13/2021  4:13 PM      Failed - Lipid Panel in normal range within the last 12 months    Cholesterol, Total  Date Value Ref Range Status  03/08/2021 134 100 - 199 mg/dL Final   LDL Chol Calc (NIH)  Date Value Ref Range Status  03/08/2021 62 0 - 99 mg/dL Final   HDL  Date Value Ref Range Status  03/08/2021 43 >39 mg/dL Final   Triglycerides  Date Value Ref Range Status  03/08/2021 174 (H) 0 - 149 mg/dL Final         Passed - Patient is not pregnant      Passed - Valid encounter within last 12 months    Recent Outpatient Visits          2 months ago Type 2 diabetes mellitus with obesity (HCC)   [facility] Marcine Matar, MD   3 months ago Need for shingles vaccine   [facility] Hooper, Cornelius Moras, RPH-CPP   5 months ago Need for shingles vaccine   [facility] Drucilla Chalet, RPH-CPP   6 months ago Type 2 diabetes mellitus with obesity [facility])    [facility] Marcine Matar, MD   1 year ago Type 2 diabetes mellitus with obesity [facility])    [facility] Marcine Matar, MD      Future Appointments            In 1 month Laural Benes, Binnie Rail, MD [facility]

## 2021-09-16 ENCOUNTER — Other Ambulatory Visit: Payer: Self-pay

## 2021-10-05 ENCOUNTER — Other Ambulatory Visit: Payer: Self-pay

## 2021-10-07 ENCOUNTER — Other Ambulatory Visit: Payer: Self-pay

## 2021-10-12 ENCOUNTER — Other Ambulatory Visit: Payer: Self-pay

## 2021-10-28 ENCOUNTER — Encounter: Payer: Self-pay | Admitting: Internal Medicine

## 2021-10-28 ENCOUNTER — Ambulatory Visit: Payer: Self-pay | Attending: Internal Medicine | Admitting: Internal Medicine

## 2021-10-28 ENCOUNTER — Other Ambulatory Visit: Payer: Self-pay

## 2021-10-28 VITALS — BP 116/64 | HR 79 | Ht 61.0 in | Wt 186.8 lb

## 2021-10-28 DIAGNOSIS — E669 Obesity, unspecified: Secondary | ICD-10-CM

## 2021-10-28 DIAGNOSIS — K219 Gastro-esophageal reflux disease without esophagitis: Secondary | ICD-10-CM

## 2021-10-28 DIAGNOSIS — Z23 Encounter for immunization: Secondary | ICD-10-CM

## 2021-10-28 DIAGNOSIS — E1169 Type 2 diabetes mellitus with other specified complication: Secondary | ICD-10-CM

## 2021-10-28 DIAGNOSIS — E785 Hyperlipidemia, unspecified: Secondary | ICD-10-CM

## 2021-10-28 LAB — POCT GLYCOSYLATED HEMOGLOBIN (HGB A1C): HbA1c, POC (controlled diabetic range): 6.4 % (ref 0.0–7.0)

## 2021-10-28 MED ORDER — OMEPRAZOLE 20 MG PO CPDR
DELAYED_RELEASE_CAPSULE | Freq: Every day | ORAL | 5 refills | Status: DC
  Filled 2021-10-28: qty 30, 30d supply, fill #0
  Filled 2022-06-07 (×2): qty 30, 30d supply, fill #1
  Filled 2022-07-13 – 2022-09-08 (×2): qty 30, 30d supply, fill #2

## 2021-10-28 MED ORDER — ATORVASTATIN CALCIUM 40 MG PO TABS
40.0000 mg | ORAL_TABLET | Freq: Every day | ORAL | 5 refills | Status: DC
  Filled 2021-10-28: qty 30, 30d supply, fill #0
  Filled 2021-11-25: qty 30, 30d supply, fill #1
  Filled 2022-01-18 – 2022-01-19 (×2): qty 30, 30d supply, fill #2
  Filled 2022-03-01: qty 30, 30d supply, fill #3
  Filled 2022-04-17 – 2022-04-18 (×2): qty 30, 30d supply, fill #4
  Filled 2022-06-07 (×2): qty 30, 30d supply, fill #5

## 2021-10-28 NOTE — Progress Notes (Signed)
Patient ID: [name] [name], female    DOB: [birth year]  MRN: [id number]  CC: Diabetes   Subjective: [name] [name] is a 61 y.o. female who presents for chronic ds management.  Son is with her Her concerns today include:  Pt with hx of DM type 2, obesity, NAFLD, HL, GERD.   HM: due for flu vaccine  DM: Results for orders placed or performed in visit on 10/28/21  POCT glycosylated hemoglobin (Hb A1C)  Result Value Ref Range   Hemoglobin A1C     HbA1c POC (<> result, manual entry)     HbA1c, POC (prediabetic range)     HbA1c, POC (controlled diabetic range) 6.4 0.0 - 7.0 %  Taking Metformin and Trulicity Checking BS every morning and has glucometer with her.  Checks in mornings with range 104-150. 30 day average 127. Doing good with eating habits Walks daily for 20-30 mins.  No chest pains or shortness of breath at rest on exertion. Had eye exam 10/22/2021 by Dr. Danella Maiers.  No retinopathy.  She has report for me today for my records Taking and tolerating lipitor Doing well on omeprazole for acid reflux.  Requests refill on omeprazole. Patient Active Problem List   Diagnosis Date Noted   Hyperlipidemia associated with type 2 diabetes mellitus ([place]) 06/06/2019   Screening breast examination 09/27/2018   Obesity (BMI 30-39.9) 09/29/2016   NAFL (nonalcoholic fatty liver) 79/03/8331   Irritant contact dermatitis due to other chemical products 05/20/2016   Uncontrolled type 2 diabetes mellitus 10/21/2014   Hyperlipidemia 11/04/2013   Environmental allergies 07/12/2013   Bell's palsy 11/06/2012   GERD 11/04/2005     Current Outpatient Medications on File Prior to Visit  Medication Sig Dispense Refill   aspirin 325 MG tablet Take 1 tablet (325 mg total) by mouth daily. 30 tablet 0   atorvastatin (LIPITOR) 40 MG tablet Take 1 tablet (40 mg total) by mouth daily. 30 tablet 2   Blood Glucose Monitoring Suppl (TRUE METRIX METER) w/Device KIT Use as directed 1 kit 0    cetirizine (ZYRTEC) 10 MG tablet Take 1 tablet (10 mg total) by mouth 2 (two) times daily. 60 tablet 5   Dulaglutide (TRULICITY) 1.5 OV/2.9VB SOPN INJECT 1.5 MG INTO THE SKIN ONCE A WEEK. 2 mL 6   glucose blood (TRUE METRIX BLOOD GLUCOSE TEST) test strip Use as instructed 100 each 10   ketotifen (ZADITOR) 0.025 % ophthalmic solution Place 1 drop into the right eye 2 (two) times daily. 10 mL 1   meloxicam (MOBIC) 7.5 MG tablet Take 1 tablet (7.5 mg total) by mouth daily. 30 tablet 2   metFORMIN (GLUCOPHAGE) 1000 MG tablet Take 1 tablet (1,000 mg total) by mouth daily with breakfast. 90 tablet 3   omeprazole (PRILOSEC) 20 MG capsule Take 1 capsule by mouth daily. 30 capsule 5   TRUEplus Lancets 28G MISC Use as directed 100 each 1   [DISCONTINUED] sitaGLIPtin (JANUVIA) 100 MG tablet Take 1 tablet (100 mg total) by mouth daily. 30 tablet 6   No current facility-administered medications on file prior to visit.    No Known Allergies  Social History   Socioeconomic History   Marital status: Married    Spouse name: Not on file   Number of children: 3   Years of education: Not on file   Highest education level: 9th grade  Occupational History   Not on file  Tobacco Use   Smoking status: Never   Smokeless tobacco: Never  Vaping Use   Vaping Use: Never used  Substance and Sexual Activity   Alcohol use: No   Drug use: No   Sexual activity: Yes    Birth control/protection: None, Surgical  Other Topics Concern   Not on file  Social History Narrative   Not on file   Social Determinants of Health   Financial Resource Strain: Not on file  Food Insecurity: Not on file  Transportation Needs: No Transportation Needs (11/28/2019)   PRAPARE - Hydrologist (Medical): No    Lack of Transportation (Non-Medical): No  Physical Activity: Not on file  Stress: Not on file  Social Connections: Not on file  Intimate Partner Violence: Not on file    Family History   Problem Relation Age of Onset   Hypertension Mother    Diabetes Mother    Diabetes Father    Diabetes Sister    Diabetes Sister    Diabetes Sister    Diabetes Sister    Diabetes Brother    Colon cancer Neg Hx    Breast cancer Neg Hx     Past Surgical History:  Procedure Laterality Date   TUBAL LIGATION  1995    ROS: Review of Systems Negative except as stated above  PHYSICAL EXAM: BP 116/64   Pulse 79   Ht $R'5\' 1"'JS$  (1.549 m)   Wt 186 lb 12.8 oz (84.7 kg)   SpO2 96%   BMI 35.30 kg/m   Wt Readings from Last 3 Encounters:  10/28/21 186 lb 12.8 oz (84.7 kg)  06/28/21 188 lb 12.8 oz (85.6 kg)  03/24/21 179 lb 6.4 oz (81.4 kg)    Physical Exam  General appearance - alert, well appearing, and in no distress Mental status - normal mood, behavior, speech, dress, motor activity, and thought processes Neck - supple, no significant adenopathy Chest - clear to auscultation, no wheezes, rales or rhonchi, symmetric air entry Heart - normal rate, regular rhythm, normal S1, S2, no murmurs, rubs, clicks or gallops Extremities - peripheral pulses normal, no pedal edema, no clubbing or cyanosis Diabetic Foot Exam - Simple   Simple Foot Form Diabetic Foot exam was performed with the following findings: Yes 10/28/2021 11:21 AM  Visual Inspection No deformities, no ulcerations, no other skin breakdown bilaterally: Yes Sensation Testing Intact to touch and monofilament testing bilaterally: Yes Pulse Check Posterior Tibialis and Dorsalis pulse intact bilaterally: Yes Comments          Latest Ref Rng & Units 03/08/2021    4:57 PM 05/05/2020   12:15 PM 06/06/2019   11:12 AM  CMP  Glucose 70 - 99 mg/dL 107  127  294   BUN 8 - 27 mg/dL $Remove'15  11  11   'BsjscBs$ Creatinine 0.57 - 1.00 mg/dL 0.64  0.60  0.64   Sodium 134 - 144 mmol/L 142  142  139   Potassium 3.5 - 5.2 mmol/L 4.3  4.2  4.0   Chloride 96 - 106 mmol/L 103  102  98   CO2 20 - 29 mmol/L $RemoveB'26  23  23   'rRYFzxIv$ Calcium 8.7 - 10.3 mg/dL 9.8   9.6  9.6   Total Protein 6.0 - 8.5 g/dL 7.1  7.2  7.2   Total Bilirubin 0.0 - 1.2 mg/dL 0.9  0.7  0.7   Alkaline Phos 44 - 121 IU/L 106  109  108   AST 0 - 40 IU/L 17  17  35   ALT 0 -  32 IU/L 22  22  42    Lipid Panel     Component Value Date/Time   CHOL 134 03/08/2021 1657   TRIG 174 (H) 03/08/2021 1657   HDL 43 03/08/2021 1657   CHOLHDL 3.1 03/08/2021 1657   CHOLHDL 4.0 06/04/2014 1055   VLDL 40 06/04/2014 1055   LDLCALC 62 03/08/2021 1657    CBC    Component Value Date/Time   WBC 7.2 03/08/2021 1657   WBC 6.2 07/12/2013 1253   RBC 4.69 03/08/2021 1657   RBC 4.72 07/12/2013 1253   HGB 13.7 03/08/2021 1657   HCT 41.0 03/08/2021 1657   PLT 269 03/08/2021 1657   MCV 87 03/08/2021 1657   MCH 29.2 03/08/2021 1657   MCH 28.8 07/12/2013 1253   MCHC 33.4 03/08/2021 1657   MCHC 34.6 07/12/2013 1253   RDW 13.1 03/08/2021 1657   LYMPHSABS 2.3 07/12/2013 1253   MONOABS 0.6 07/12/2013 1253   EOSABS 0.2 07/12/2013 1253   BASOSABS 0.0 07/12/2013 1253    ASSESSMENT AND PLAN: 1. Type 2 diabetes mellitus with obesity ([place]) At goal.  Continue metformin and Trulicity 1.5 mg once a week. Commended her on healthy eating habits and regular exercise.  Keep up the good works. - POCT glycosylated hemoglobin (Hb A1C)  2. Hyperlipidemia associated with type 2 diabetes mellitus (HCC) Continue atorvastatin 40 mg daily.  Last LDL was at goal of 62.  3. GERD without esophagitis Refill sent on omeprazole.  4. Need for immunization against influenza Given flu vaccine today.  Encouraged her to get COVID booster at any outside pharmacy.   [employer] Language interpreter used during this encounter. #[id number]  Patient was given the opportunity to ask questions.  Patient verbalized understanding of the plan and was able to repeat key elements of the plan.   This documentation was completed using Radio producer.  Any transcriptional errors are unintentional.  Orders Placed This  Encounter  Procedures   POCT glycosylated hemoglobin (Hb A1C)     Requested Prescriptions    No prescriptions requested or ordered in this encounter    Return in about 4 months (around 02/28/2022).  Karle Plumber, MD, FACP

## 2021-11-01 ENCOUNTER — Other Ambulatory Visit: Payer: Self-pay

## 2021-11-02 ENCOUNTER — Other Ambulatory Visit: Payer: Self-pay

## 2021-11-25 ENCOUNTER — Other Ambulatory Visit: Payer: Self-pay

## 2021-11-26 ENCOUNTER — Other Ambulatory Visit: Payer: Self-pay

## 2021-12-02 ENCOUNTER — Other Ambulatory Visit: Payer: Self-pay

## 2021-12-02 ENCOUNTER — Encounter: Payer: Self-pay | Admitting: Internal Medicine

## 2021-12-02 DIAGNOSIS — Z1231 Encounter for screening mammogram for malignant neoplasm of breast: Secondary | ICD-10-CM

## 2021-12-30 ENCOUNTER — Other Ambulatory Visit: Payer: Self-pay

## 2022-01-13 ENCOUNTER — Other Ambulatory Visit: Payer: Self-pay

## 2022-01-19 ENCOUNTER — Other Ambulatory Visit: Payer: Self-pay

## 2022-01-27 ENCOUNTER — Ambulatory Visit
Admission: RE | Admit: 2022-01-27 | Discharge: 2022-01-27 | Disposition: A | Discharge status: Home / Self Care | Payer: No Typology Code available for payment source | Source: Ambulatory Visit | Attending: Obstetrics and Gynecology | Admitting: Obstetrics and Gynecology

## 2022-01-27 ENCOUNTER — Ambulatory Visit: Payer: Self-pay | Admitting: Hematology and Oncology

## 2022-01-27 VITALS — BP 114/56 | Wt 188.0 lb

## 2022-01-27 DIAGNOSIS — Z1231 Encounter for screening mammogram for malignant neoplasm of breast: Secondary | ICD-10-CM

## 2022-01-27 NOTE — Progress Notes (Signed)
[name] is a 62 y.o. female who presents to [facility] clinic today with no complaints.    Pap Smear: Pap not smear completed today. Last Pap smear was 2021 and was normal. Per patient has no history of an abnormal Pap smear. Last Pap smear result is not available in Epic.   Physical exam: Breasts Breasts symmetrical. No skin abnormalities bilateral breasts. No nipple retraction bilateral breasts. No nipple discharge bilateral breasts. No lymphadenopathy. No lumps palpated bilateral breasts.     MM 3D SCREEN BREAST BILATERAL  Result Date: 12/04/2020 CLINICAL DATA:  Screening. EXAM: DIGITAL SCREENING BILATERAL MAMMOGRAM WITH TOMOSYNTHESIS AND CAD TECHNIQUE: Bilateral screening digital craniocaudal and mediolateral oblique mammograms were obtained. Bilateral screening digital breast tomosynthesis was performed. The images were evaluated with computer-aided detection. COMPARISON:  Previous exam(s). ACR Breast Density Category b: There are scattered areas of fibroglandular density. FINDINGS: There are no findings suspicious for malignancy. IMPRESSION: No mammographic evidence of malignancy. A result letter of this screening mammogram will be mailed directly to the patient. RECOMMENDATION: Screening mammogram in one year. (Code:SM-B-01Y) BI-RADS CATEGORY  1: Negative. Electronically Signed   By: Marin Olp M.D.   On: 12/04/2020 16:02   MS DIGITAL SCREENING TOMO BILATERAL  Result Date: 12/02/2019 CLINICAL DATA:  Screening. EXAM: DIGITAL SCREENING BILATERAL MAMMOGRAM WITH TOMO AND CAD COMPARISON:  Previous exam(s). ACR Breast Density Category c: The breast tissue is heterogeneously dense, which may obscure small masses. FINDINGS: There are no findings suspicious for malignancy. Images were processed with CAD. IMPRESSION: No mammographic evidence of malignancy. A result letter of this screening mammogram will be mailed directly to the patient. RECOMMENDATION: Screening mammogram in one  year. (Code:SM-B-01Y) BI-RADS CATEGORY  1: Negative. Electronically Signed   By: Claudie Revering M.D.   On: 12/02/2019 12:04   MS DIGITAL SCREENING TOMO BILATERAL  Result Date: 09/28/2018 CLINICAL DATA:  Screening. EXAM: DIGITAL SCREENING BILATERAL MAMMOGRAM WITH TOMO AND CAD COMPARISON:  Previous exam(s). ACR Breast Density Category c: The breast tissue is heterogeneously dense, which may obscure small masses. FINDINGS: There are no findings suspicious for malignancy. Images were processed with CAD. IMPRESSION: No mammographic evidence of malignancy. A result letter of this screening mammogram will be mailed directly to the patient. RECOMMENDATION: Screening mammogram in one year. (Code:SM-B-01Y) BI-RADS CATEGORY  1: Negative. Electronically Signed   By: Lajean Manes M.D.   On: 09/28/2018 13:23   MS DIGITAL SCREENING TOMO BILATERAL  Result Date: 05/09/2017 CLINICAL DATA:  Screening. EXAM: DIGITAL SCREENING BILATERAL MAMMOGRAM WITH TOMO AND CAD COMPARISON:  Previous exam(s). ACR Breast Density Category c: The breast tissue is heterogeneously dense, which may obscure small masses. FINDINGS: There are no findings suspicious for malignancy. Images were processed with CAD. IMPRESSION: No mammographic evidence of malignancy. A result letter of this screening mammogram will be mailed directly to the patient. RECOMMENDATION: Screening mammogram in one year. (Code:SM-B-01Y) BI-RADS CATEGORY  1: Negative. Electronically Signed   By: Lajean Manes M.D.   On: 05/09/2017 12:59      Pelvic/Bimanual Pap is not indicated today    Smoking History: Patient has never smoked and was not referred to quit line.    Patient Navigation: Patient education provided. Access to services provided for patient through [place] interpreter provided. No transportation provided   Colorectal Cancer Screening: Per patient has never had colonoscopy completed No complaints today.    Breast and Cervical Cancer  Risk Assessment: Patient does not have family history of breast cancer, known  genetic mutations, or radiation treatment to the chest before age 68. Patient does not have history of cervical dysplasia, immunocompromised, or DES exposure in-utero.  Risk Assessment   No risk assessment data for the current encounter  Risk Scores       11/28/2019   Last edited by: Demetrius Revel, LPN   5-year risk: 0.6 %   Lifetime risk: 3.5 %            A: [facility] exam without pap smear No complaints with benign exam.   P: Referred patient to the [facility] for a screening mammogram. Appointment scheduled 01/27/2022.  [name], NP 01/27/2022 2:04 PM

## 2022-01-27 NOTE — Patient Instructions (Signed)
Taught [name] about BSE and gave educational materials to take home. Patient did not need a Pap smear today due to last Pap smear was in 2021 per patient. Let her know [facility] will cover Pap smears every 5 years unless has a history of abnormal Pap smears. Referred patient to the [facility] for diagnostic mammogram. Appointment scheduled for 01/27/2022. Patient aware of appointment and will be there. Let patient know will follow up with her within the next couple weeks with results. [name] [name] verbalized understanding.  [name], NP 2:06 PM

## 2022-01-30 ENCOUNTER — Other Ambulatory Visit: Payer: Self-pay | Admitting: Internal Medicine

## 2022-01-30 DIAGNOSIS — E1169 Type 2 diabetes mellitus with other specified complication: Secondary | ICD-10-CM

## 2022-01-31 ENCOUNTER — Other Ambulatory Visit: Payer: Self-pay

## 2022-01-31 MED ORDER — TRULICITY 1.5 MG/0.5ML ~~LOC~~ SOAJ
1.5000 mg | SUBCUTANEOUS | 6 refills | Status: DC
  Filled 2022-01-31: qty 2, 28d supply, fill #0
  Filled 2022-03-01: qty 2, 28d supply, fill #1
  Filled 2022-04-04 (×2): qty 2, 28d supply, fill #2
  Filled 2022-05-05 (×2): qty 2, 28d supply, fill #3
  Filled 2022-06-07 (×2): qty 2, 28d supply, fill #4
  Filled 2022-07-13: qty 2, 28d supply, fill #5

## 2022-02-01 ENCOUNTER — Other Ambulatory Visit: Payer: Self-pay

## 2022-03-01 ENCOUNTER — Ambulatory Visit: Payer: Self-pay | Attending: Internal Medicine | Admitting: Internal Medicine

## 2022-03-01 ENCOUNTER — Encounter: Payer: Self-pay | Admitting: Internal Medicine

## 2022-03-01 ENCOUNTER — Other Ambulatory Visit: Payer: Self-pay

## 2022-03-01 VITALS — BP 107/70 | HR 78 | Temp 97.4°F | Ht 61.0 in | Wt 186.0 lb

## 2022-03-01 DIAGNOSIS — E1169 Type 2 diabetes mellitus with other specified complication: Secondary | ICD-10-CM

## 2022-03-01 DIAGNOSIS — E785 Hyperlipidemia, unspecified: Secondary | ICD-10-CM

## 2022-03-01 DIAGNOSIS — E669 Obesity, unspecified: Secondary | ICD-10-CM

## 2022-03-01 DIAGNOSIS — Z6835 Body mass index (BMI) 35.0-35.9, adult: Secondary | ICD-10-CM

## 2022-03-01 LAB — POCT GLYCOSYLATED HEMOGLOBIN (HGB A1C): HbA1c, POC (controlled diabetic range): 6.1 % (ref 0.0–7.0)

## 2022-03-01 MED ORDER — METFORMIN HCL 1000 MG PO TABS
1000.0000 mg | ORAL_TABLET | Freq: Every day | ORAL | 3 refills | Status: DC
  Filled 2022-03-01: qty 90, 90d supply, fill #0
  Filled 2022-06-07 (×2): qty 90, 90d supply, fill #1
  Filled 2022-10-19: qty 90, 90d supply, fill #2
  Filled 2023-02-09: qty 90, 90d supply, fill #3

## 2022-03-01 NOTE — Progress Notes (Signed)
Patient ID: [name] [name], female    DOB: [birth year]  MRN: [id number]  CC: Diabetes (DM f/u. Med refill/Already received flu vax. )   Subjective: [name] [name] is a 62 y.o. female who presents for chronic ds management Her concerns today include:  Pt with hx of DM type 2, obesity, NAFLD, HL, GERD.   DM Results for orders placed or performed in visit on 03/01/22  POCT glycosylated hemoglobin (Hb A1C)  Result Value Ref Range   Hemoglobin A1C     HbA1c POC (<> result, manual entry)     HbA1c, POC (prediabetic range)     HbA1c, POC (controlled diabetic range) 6.1 0.0 - 7.0 %  Compliant with Metformin 1 gram daily and Trulicity 1.5 mg Q wk Doing well with eating habits Exercise: walking daily for 30-60 mins Taking and tolerating Lipitor for cholesterol   Patient Active Problem List   Diagnosis Date Noted   Hyperlipidemia associated with type 2 diabetes mellitus ([place]) 06/06/2019   Screening breast examination 09/27/2018   Obesity (BMI 30-39.9) 09/29/2016   NAFL (nonalcoholic fatty liver) 99991111   Irritant contact dermatitis due to other chemical products 05/20/2016   Uncontrolled type 2 diabetes mellitus 10/21/2014   Hyperlipidemia 11/04/2013   Environmental allergies 07/12/2013   Bell's palsy 11/06/2012   GERD 11/04/2005     Current Outpatient Medications on File Prior to Visit  Medication Sig Dispense Refill   aspirin 325 MG tablet Take 1 tablet (325 mg total) by mouth daily. 30 tablet 0   atorvastatin (LIPITOR) 40 MG tablet Take 1 tablet (40 mg total) by mouth daily. 30 tablet 5   Blood Glucose Monitoring Suppl (TRUE METRIX METER) w/Device KIT Use as directed 1 kit 0   cetirizine (ZYRTEC) 10 MG tablet Take 1 tablet (10 mg total) by mouth 2 (two) times daily. 60 tablet 5   Dulaglutide (TRULICITY) 1.5 0000000 SOPN INJECT 1.5 MG INTO THE SKIN ONCE A WEEK. 2 mL 6   glucose blood (TRUE METRIX BLOOD GLUCOSE TEST) test strip Use as instructed 100  each 10   ketotifen (ZADITOR) 0.025 % ophthalmic solution Place 1 drop into the right eye 2 (two) times daily. 10 mL 1   meloxicam (MOBIC) 7.5 MG tablet Take 1 tablet (7.5 mg total) by mouth daily. 30 tablet 2   metFORMIN (GLUCOPHAGE) 1000 MG tablet Take 1 tablet (1,000 mg total) by mouth daily with breakfast. 90 tablet 3   omeprazole (PRILOSEC) 20 MG capsule Take 1 capsule by mouth daily. 30 capsule 5   TRUEplus Lancets 28G MISC Use as directed 100 each 1   [DISCONTINUED] sitaGLIPtin (JANUVIA) 100 MG tablet Take 1 tablet (100 mg total) by mouth daily. 30 tablet 6   No current facility-administered medications on file prior to visit.    No Known Allergies  Social History   Socioeconomic History   Marital status: Married    Spouse name: Not on file   Number of children: 3   Years of education: Not on file   Highest education level: 9th grade  Occupational History   Not on file  Tobacco Use   Smoking status: Never   Smokeless tobacco: Never  Vaping Use   Vaping Use: Never used  Substance and Sexual Activity   Alcohol use: No   Drug use: No   Sexual activity: Yes    Birth control/protection: None, Surgical  Other Topics Concern   Not on file  Social History Narrative   Not on file  Social Determinants of Health   Financial Resource Strain: Not on file  Food Insecurity: Food Insecurity Present (01/27/2022)   Hunger Vital Sign    Worried About Running Out of Food in the Last Year: Sometimes true    Ran Out of Food in the Last Year: Sometimes true  Transportation Needs: No Transportation Needs (01/27/2022)   PRAPARE - Hydrologist (Medical): No    Lack of Transportation (Non-Medical): No  Physical Activity: Not on file  Stress: Not on file  Social Connections: Not on file  Intimate Partner Violence: Not on file    Family History  Problem Relation Age of Onset   Hypertension Mother    Diabetes Mother    Diabetes Father    Diabetes Sister     Diabetes Sister    Diabetes Sister    Diabetes Sister    Diabetes Brother    Colon cancer Neg Hx    Breast cancer Neg Hx     Past Surgical History:  Procedure Laterality Date   TUBAL LIGATION  1995    ROS: Review of Systems Negative except as stated above  PHYSICAL EXAM: BP 107/70 (BP Location: Left Arm, Patient Position: Sitting, Cuff Size: Normal)   Pulse 78   Temp (!) 97.4 F (36.3 C) (Oral)   Ht 5' 1"$  (1.549 m)   Wt 186 lb (84.4 kg)   SpO2 99%   BMI 35.14 kg/m   Wt Readings from Last 3 Encounters:  03/01/22 186 lb (84.4 kg)  01/27/22 188 lb (85.3 kg)  10/28/21 186 lb 12.8 oz (84.7 kg)    Physical Exam  General appearance - alert, well appearing, older Hispanic female and in no distress Mental status - normal mood, behavior, speech, dress, motor activity, and thought processes Chest - clear to auscultation, no wheezes, rales or rhonchi, symmetric air entry Heart - normal rate, regular rhythm, normal S1, S2, no murmurs, rubs, clicks or gallops Extremities - peripheral pulses normal, no pedal edema, no clubbing or cyanosis      Latest Ref Rng & Units 03/08/2021    4:57 PM 05/05/2020   12:15 PM 06/06/2019   11:12 AM  CMP  Glucose 70 - 99 mg/dL 107  127  294   BUN 8 - 27 mg/dL 15  11  11   $ Creatinine 0.57 - 1.00 mg/dL 0.64  0.60  0.64   Sodium 134 - 144 mmol/L 142  142  139   Potassium 3.5 - 5.2 mmol/L 4.3  4.2  4.0   Chloride 96 - 106 mmol/L 103  102  98   CO2 20 - 29 mmol/L 26  23  23   $ Calcium 8.7 - 10.3 mg/dL 9.8  9.6  9.6   Total Protein 6.0 - 8.5 g/dL 7.1  7.2  7.2   Total Bilirubin 0.0 - 1.2 mg/dL 0.9  0.7  0.7   Alkaline Phos 44 - 121 IU/L 106  109  108   AST 0 - 40 IU/L 17  17  35   ALT 0 - 32 IU/L 22  22  42    Lipid Panel     Component Value Date/Time   CHOL 134 03/08/2021 1657   TRIG 174 (H) 03/08/2021 1657   HDL 43 03/08/2021 1657   CHOLHDL 3.1 03/08/2021 1657   CHOLHDL 4.0 06/04/2014 1055   VLDL 40 06/04/2014 1055   LDLCALC 62  03/08/2021 1657    CBC    Component Value  Date/Time   WBC 7.2 03/08/2021 1657   WBC 6.2 07/12/2013 1253   RBC 4.69 03/08/2021 1657   RBC 4.72 07/12/2013 1253   HGB 13.7 03/08/2021 1657   HCT 41.0 03/08/2021 1657   PLT 269 03/08/2021 1657   MCV 87 03/08/2021 1657   MCH 29.2 03/08/2021 1657   MCH 28.8 07/12/2013 1253   MCHC 33.4 03/08/2021 1657   MCHC 34.6 07/12/2013 1253   RDW 13.1 03/08/2021 1657   LYMPHSABS 2.3 07/12/2013 1253   MONOABS 0.6 07/12/2013 1253   EOSABS 0.2 07/12/2013 1253   BASOSABS 0.0 07/12/2013 1253    ASSESSMENT AND PLAN:  1. Type 2 diabetes mellitus with obesity ([place]) At goal.  Continue metformin and Trulicity.  Continue healthy eating habits and regular exercise. - POCT glucose (manual entry) - POCT glycosylated hemoglobin (Hb A1C) - CBC - Comprehensive metabolic panel - metFORMIN (GLUCOPHAGE) 1000 MG tablet; Take 1 tablet (1,000 mg total) by mouth daily with breakfast.  Dispense: 90 tablet; Refill: 3  2. Hyperlipidemia associated with type 2 diabetes mellitus (HCC) Continue atorvastatin.  Due for repeat lipid profile. - Lipid panel   AMN Language interpreter used during this encounter. [id number], [place]  Patient was given the opportunity to ask questions.  Patient verbalized understanding of the plan and was able to repeat key elements of the plan.   This documentation was completed using Radio producer.  Any transcriptional errors are unintentional.  Orders Placed This Encounter  Procedures   POCT glucose (manual entry)   POCT glycosylated hemoglobin (Hb A1C)     Requested Prescriptions    No prescriptions requested or ordered in this encounter    No follow-ups on file.  Karle Plumber, MD, FACP

## 2022-03-02 LAB — CBC
Hematocrit: 41.8 % (ref 34.0–46.6)
Hemoglobin: 14 g/dL (ref 11.1–15.9)
MCH: 29.2 pg (ref 26.6–33.0)
MCHC: 33.5 g/dL (ref 31.5–35.7)
MCV: 87 fL (ref 79–97)
Platelets: 249 10*3/uL (ref 150–450)
RBC: 4.79 x10E6/uL (ref 3.77–5.28)
RDW: 12.6 % (ref 11.7–15.4)
WBC: 4.8 10*3/uL (ref 3.4–10.8)

## 2022-03-02 LAB — COMPREHENSIVE METABOLIC PANEL
ALT: 20 IU/L (ref 0–32)
AST: 19 IU/L (ref 0–40)
Albumin/Globulin Ratio: 1.8 (ref 1.2–2.2)
Albumin: 4.5 g/dL (ref 3.9–4.9)
Alkaline Phosphatase: 102 IU/L (ref 44–121)
BUN/Creatinine Ratio: 14 (ref 12–28)
BUN: 8 mg/dL (ref 8–27)
Bilirubin Total: 0.9 mg/dL (ref 0.0–1.2)
CO2: 23 mmol/L (ref 20–29)
Calcium: 9.5 mg/dL (ref 8.7–10.3)
Chloride: 102 mmol/L (ref 96–106)
Creatinine, Ser: 0.59 mg/dL (ref 0.57–1.00)
Globulin, Total: 2.5 g/dL (ref 1.5–4.5)
Glucose: 108 mg/dL — ABNORMAL HIGH (ref 70–99)
Potassium: 4.2 mmol/L (ref 3.5–5.2)
Sodium: 141 mmol/L (ref 134–144)
Total Protein: 7 g/dL (ref 6.0–8.5)
eGFR: 102 mL/min/{1.73_m2} (ref >59)

## 2022-03-02 LAB — LIPID PANEL
Chol/HDL Ratio: 2.7 ratio (ref 0.0–4.4)
Cholesterol, Total: 142 mg/dL (ref 100–199)
HDL: 52 mg/dL (ref >39)
LDL Chol Calc (NIH): 71 mg/dL (ref 0–99)
Triglycerides: 107 mg/dL (ref 0–149)
VLDL Cholesterol Cal: 19 mg/dL (ref 5–40)

## 2022-04-04 ENCOUNTER — Other Ambulatory Visit: Payer: Self-pay

## 2022-04-17 ENCOUNTER — Other Ambulatory Visit: Payer: Self-pay | Admitting: Internal Medicine

## 2022-04-17 DIAGNOSIS — M5416 Radiculopathy, lumbar region: Secondary | ICD-10-CM

## 2022-04-18 ENCOUNTER — Other Ambulatory Visit: Payer: Self-pay

## 2022-04-18 MED ORDER — MELOXICAM 7.5 MG PO TABS
7.5000 mg | ORAL_TABLET | Freq: Every day | ORAL | 0 refills | Status: DC
  Filled 2022-04-18: qty 30, 30d supply, fill #0

## 2022-04-19 ENCOUNTER — Other Ambulatory Visit: Payer: Self-pay

## 2022-05-05 ENCOUNTER — Other Ambulatory Visit: Payer: Self-pay | Admitting: Internal Medicine

## 2022-05-05 ENCOUNTER — Other Ambulatory Visit: Payer: Self-pay

## 2022-05-05 DIAGNOSIS — E1169 Type 2 diabetes mellitus with other specified complication: Secondary | ICD-10-CM

## 2022-05-05 MED ORDER — TRUE METRIX BLOOD GLUCOSE TEST VI STRP
ORAL_STRIP | 3 refills | Status: AC
  Filled 2022-05-05: qty 100, 25d supply, fill #0
  Filled 2022-07-13: qty 100, 25d supply, fill #1
  Filled 2023-04-20: qty 100, 25d supply, fill #2

## 2022-05-06 ENCOUNTER — Other Ambulatory Visit: Payer: Self-pay

## 2022-06-07 ENCOUNTER — Other Ambulatory Visit: Payer: Self-pay

## 2022-06-08 ENCOUNTER — Other Ambulatory Visit: Payer: Self-pay

## 2022-06-29 ENCOUNTER — Ambulatory Visit: Payer: Self-pay | Admitting: Internal Medicine

## 2022-06-30 ENCOUNTER — Ambulatory Visit: Payer: No Typology Code available for payment source | Admitting: Internal Medicine

## 2022-07-13 ENCOUNTER — Other Ambulatory Visit: Payer: Self-pay

## 2022-07-13 ENCOUNTER — Other Ambulatory Visit: Payer: Self-pay | Admitting: Internal Medicine

## 2022-07-13 DIAGNOSIS — E1169 Type 2 diabetes mellitus with other specified complication: Secondary | ICD-10-CM

## 2022-07-13 MED ORDER — ATORVASTATIN CALCIUM 40 MG PO TABS
40.0000 mg | ORAL_TABLET | Freq: Every day | ORAL | 0 refills | Status: DC
  Filled 2022-07-13: qty 90, 90d supply, fill #0

## 2022-07-20 ENCOUNTER — Other Ambulatory Visit: Payer: Self-pay

## 2022-07-20 ENCOUNTER — Ambulatory Visit: Payer: Self-pay | Attending: Internal Medicine | Admitting: Physician Assistant

## 2022-07-20 VITALS — BP 107/62 | HR 68 | Temp 98.0°F | Ht 61.0 in | Wt 187.0 lb

## 2022-07-20 DIAGNOSIS — M5416 Radiculopathy, lumbar region: Secondary | ICD-10-CM

## 2022-07-20 DIAGNOSIS — E785 Hyperlipidemia, unspecified: Secondary | ICD-10-CM

## 2022-07-20 DIAGNOSIS — Z7985 Long-term (current) use of injectable non-insulin antidiabetic drugs: Secondary | ICD-10-CM

## 2022-07-20 DIAGNOSIS — E1169 Type 2 diabetes mellitus with other specified complication: Secondary | ICD-10-CM

## 2022-07-20 DIAGNOSIS — Z758 Other problems related to medical facilities and other health care: Secondary | ICD-10-CM

## 2022-07-20 DIAGNOSIS — K219 Gastro-esophageal reflux disease without esophagitis: Secondary | ICD-10-CM

## 2022-07-20 DIAGNOSIS — E669 Obesity, unspecified: Secondary | ICD-10-CM

## 2022-07-20 DIAGNOSIS — Z7984 Long term (current) use of oral hypoglycemic drugs: Secondary | ICD-10-CM

## 2022-07-20 DIAGNOSIS — G5603 Carpal tunnel syndrome, bilateral upper limbs: Secondary | ICD-10-CM

## 2022-07-20 DIAGNOSIS — Z603 Acculturation difficulty: Secondary | ICD-10-CM

## 2022-07-20 LAB — POCT GLYCOSYLATED HEMOGLOBIN (HGB A1C): HbA1c, POC (controlled diabetic range): 6.3 % (ref 0.0–7.0)

## 2022-07-20 LAB — GLUCOSE, POCT (MANUAL RESULT ENTRY): POC Glucose: 121 mg/dl — AB (ref 70–99)

## 2022-07-20 MED ORDER — MELOXICAM 7.5 MG PO TABS
7.5000 mg | ORAL_TABLET | Freq: Every day | ORAL | 3 refills | Status: AC
  Filled 2022-07-20: qty 30, 30d supply, fill #0
  Filled 2023-04-20: qty 30, 30d supply, fill #1

## 2022-07-20 MED ORDER — TRULICITY 1.5 MG/0.5ML ~~LOC~~ SOAJ
1.5000 mg | SUBCUTANEOUS | 6 refills | Status: DC
  Filled 2022-07-20 – 2022-08-16 (×2): qty 2, 28d supply, fill #0
  Filled 2022-09-20 (×2): qty 2, 28d supply, fill #1
  Filled 2022-10-20 (×2): qty 2, 28d supply, fill #2
  Filled 2022-11-17 – 2022-11-18 (×2): qty 2, 28d supply, fill #3
  Filled 2023-01-02 (×2): qty 2, 28d supply, fill #4
  Filled 2023-02-08 – 2023-02-09 (×2): qty 2, 28d supply, fill #5
  Filled 2023-03-13 (×2): qty 2, 28d supply, fill #6

## 2022-07-20 MED ORDER — ATORVASTATIN CALCIUM 40 MG PO TABS
40.0000 mg | ORAL_TABLET | Freq: Every day | ORAL | 1 refills | Status: DC
  Filled 2022-07-20: qty 90, 90d supply, fill #0
  Filled 2022-11-18: qty 90, 90d supply, fill #1

## 2022-07-20 NOTE — Patient Instructions (Signed)
Sndrome del tnel carpiano Carpal Tunnel Syndrome  El sndrome del tnel carpiano es una afeccin que causa dolor, debilidad y adormecimiento en la mano y los dedos. El adormecimiento es cuando no siente una zona del cuerpo. El tnel carpiano es un rea estrecha que se encuentra en el lado palmar de la mueca. Los movimientos repetidos de la mueca o determinadas enfermedades pueden causar hinchazn en el tnel. Esta hinchazn puede comprimir el nervio principal de la mueca. Este nervio se llama "nervio mediano". Cules son las causas? Esta afeccin puede ser causada por lo siguiente: Mover la mano y la mueca una y otra vez mientras realiza una tarea. Lesin en la mueca. Artritis. Un saco lleno de lquido (quiste) o un crecimiento anormal (tumor) en el tnel carpiano. Acumulacin de lquido durante el embarazo. Uso de herramientas que vibran. Algunas veces, la causa no se conoce. Qu incrementa el riesgo? Los siguientes factores pueden hacer que sea ms propenso a tener esta afeccin: Tener un trabajo en el que deba hacer estas cosas: Mover la mano una y otra vez. Trabajar con herramientas que vibran, como taladros o lijadoras. Ser mujer. Tener diabetes, obesidad, problemas de tiroides o insuficiencia renal. Cules son los signos o sntomas? Los sntomas de esta afeccin incluyen: Sensacin de hormigueo en los dedos. Hormigueo o prdida de la sensibilidad de la mano. Dolor en todo el brazo. Este dolor puede empeorar al flexionar la mueca y el codo durante mucho tiempo. Dolor en la mueca que sube por el brazo hasta el hombro. Dolor que baja hasta la palma de la mano o los dedos. Debilidad en las manos. Puede resultarle difcil tomar y sostener objetos. Es posible que se sienta peor por la noche. Cmo se trata? El tratamiento de esta afeccin puede incluir: Cambios en el estilo de vida. Se le pedir que deje o cambie la actividad que caus el problema. Hacer ejercicios y  actividades para que los huesos, los msculos y los tendones se vuelvan ms fuertes (fisioterapia). Aprender a usar la mano nuevamente (terapia ocupacional). Medicamentos para el dolor y la hinchazn. Es posible que le apliquen inyecciones en la mueca. Una frula o un dispositivo ortopdico para la mueca. Ciruga. Siga estas instrucciones en su casa: Si tiene una frula o un dispositivo ortopdico: Use la frula o el dispositivo ortopdico como se lo haya indicado el mdico. Quteselos solamente como se lo haya indicado el mdico. Afloje la frula si los dedos: Hormiguean. Se adormecen. Se tornan fros y de color azul. Mantenga la frula o el dispositivo ortopdico limpios. Si la frula o el dispositivo ortopdico no son impermeables: No deje que se mojen. Cbralos con un envoltorio hermtico cuando tome un bao de inmersin o una ducha. Control del dolor, la rigidez y la hinchazn Si se lo indican, aplique hielo sobre la zona dolorida: Si tiene un dispositivo ortopdico o una frula desmontable, quteselos como se lo haya indicado el mdico. Ponga el hielo en una bolsa plstica. Coloque una toalla entre la piel y la bolsa. Coloque el hielo durante 20 minutos, 2 a 3 veces al da. No se quede dormido con la bolsa de hielo sobre la piel. Retire el hielo si la piel se le pone de color rojo brillante. Esto es muy importante. Si no puede sentir dolor, calor o fro, tiene un mayor riesgo de que se dae la zona. Mueva los dedos con frecuencia para reducir la rigidez y la hinchazn. Instrucciones generales Tome los medicamentos de venta libre y los recetados solamente   como se lo haya indicado el mdico. Descanse la mueca de cualquier actividad que le cause dolor. Si es necesario, hable con su jefe en el trabajo sobre los cambios que pueden ayudar a la curacin de la mueca. Haga los ejercicios que le hayan indicado el mdico, el fisioterapeuta o el terapeuta ocupacional. Cumpla con todas las  visitas de seguimiento. Comunquese con un mdico si: Aparecen nuevos sntomas. Los medicamentos no le alivian el dolor. Sus sntomas empeoran. Solicite ayuda de inmediato si: Tiene adormecimiento u hormigueo muy intensos en la mueca o la mano. Resumen El sndrome del tnel carpiano es una afeccin que causa dolor en la mano y en el brazo. Suele deberse a movimientos repetidos de la mueca. Este problema se trata mediante cambios en el estilo de vida y medicamentos. La ciruga puede ser necesaria en casos muy graves. Siga las instrucciones del mdico sobre el uso de una frula, el reposo de la mueca, la asistencia a las consultas de seguimiento y llamar para pedir ayuda. Esta informacin no tiene como fin reemplazar el consejo del mdico. Asegrese de hacerle al mdico cualquier pregunta que tenga. Document Revised: 06/21/2019 Document Reviewed: 06/21/2019 Elsevier Patient Education  2024 Elsevier Inc.  

## 2022-07-20 NOTE — Progress Notes (Signed)
Patient ID: [name] [name], female   DOB: 01-Apr-1960, 62 y.o.   MRN: [id number]   [name] [name], is a 62 y.o. female  [id number]  [id number]  DOB - 20-Sep-1960  Chief Complaint  Patient presents with   Diabetes    DM f/u. Med refills.  Numbness on fingers of R & L hand X1 week       Subjective:   [name] [name] is a 62 y.o. female here today for RF on some meds.  Recheck A1C.  C/o B hands tingling worse at night and also bothers her in the daytime.  She does a lot of repetitive work with her hands.  This has been bother ing her a couple weeks.  NKI.  Has RF on some meds.    No problems updated.  ALLERGIES: No Known Allergies  PAST MEDICAL HISTORY: Past Medical History:  Diagnosis Date   Diabetes mellitus without complication (HCC)    Hyperlipidemia    Medical history non-contributory     MEDICATIONS AT HOME: Prior to Admission medications   Medication Sig Start Date End Date Taking? Authorizing Provider  aspirin 325 MG tablet Take 1 tablet (325 mg total) by mouth daily. 11/07/12  Yes Drema Dallas, MD  Blood Glucose Monitoring Suppl (TRUE METRIX METER) w/Device KIT Use as directed 03/08/21  Yes Marcine Matar, MD  cetirizine (ZYRTEC) 10 MG tablet Take 1 tablet (10 mg total) by mouth 2 (two) times daily. 06/28/16  Yes Funches, Josalyn, MD  glucose blood (TRUE METRIX BLOOD GLUCOSE TEST) test strip Use as instructed 05/05/22  Yes Marcine Matar, MD  ketotifen (ZADITOR) 0.025 % ophthalmic solution Place 1 drop into the right eye 2 (two) times daily. 08/25/15  Yes Funches, Gerilyn Nestle, MD  metFORMIN (GLUCOPHAGE) 1000 MG tablet Take 1 tablet (1,000 mg total) by mouth daily with breakfast. 03/01/22 03/01/23 Yes Marcine Matar, MD  omeprazole (PRILOSEC) 20 MG capsule Take 1 capsule by mouth daily. 10/28/21 10/28/22 Yes Marcine Matar, MD  TRUEplus Lancets 28G MISC Use as directed 03/08/21  Yes Marcine Matar, MD  atorvastatin (LIPITOR)  40 MG tablet Take 1 tablet (40 mg total) by mouth daily. 07/20/22   Anders Simmonds, PA-C  Dulaglutide (TRULICITY) 1.5 MG/0.5ML SOPN INJECT 1.5 MG INTO THE SKIN ONCE A WEEK. 07/20/22   Anders Simmonds, PA-C  meloxicam (MOBIC) 7.5 MG tablet Take 1 tablet (7.5 mg total) by mouth daily. 07/20/22   Anders Simmonds, PA-C  sitaGLIPtin (JANUVIA) 100 MG tablet Take 1 tablet (100 mg total) by mouth daily. 06/06/19 01/07/20  Marcine Matar, MD    ROS: Neg HEENT Neg resp Neg cardiac Neg GI Neg GU Neg psych Neg neuro  Objective:   Vitals:   07/20/22 1000  BP: 107/62  Pulse: 68  Temp: 98 F (36.7 C)  TempSrc: Oral  SpO2: 98%  Weight: 187 lb (84.8 kg)  Height: 5\' 1"  (1.549 m)   Exam General appearance : Awake, alert, not in any distress. Speech Clear. Not toxic looking HEENT: Atraumatic and NormocephalicNeck: Supple, no JVD. No cervical lymphadenopathy.  Chest: Good air entry bilaterally, CTAB.  No rales/rhonchi/wheezing CVS: S1 S2 regular, no murmurs.  Extremities: B/L Lower Ext shows no edema, both legs are warm to touch B hands with full S&ROM without any gross abnormality.  +phalen's B.  Neg tinels B Neurology: Awake alert, and oriented X 3, CN II-XII intact, Non focal Skin: No Rash  Data Review Lab Results  Component Value Date  HGBA1C 6.3 07/20/2022   HGBA1C 6.1 03/01/2022   HGBA1C 6.4 10/28/2021    Assessment & Plan   1. Type 2 diabetes mellitus with obesity (HCC) A1C from 6.1 to 6.3.  work on diabetic diet - Glucose (CBG) - HgB A1c - Dulaglutide (TRULICITY) 1.5 MG/0.5ML SOPN; INJECT 1.5 MG INTO THE SKIN ONCE A WEEK.  Dispense: 2 mL; Refill: 6  2. GERD without esophagitis Stable on omeprazole  3. Hyperlipidemia associated with type 2 diabetes mellitus (HCC) - atorvastatin (LIPITOR) 40 MG tablet; Take 1 tablet (40 mg total) by mouth daily.  Dispense: 90 tablet; Refill: 1  4. Lumbar radiculopathy, chronic - meloxicam (MOBIC) 7.5 MG tablet; Take 1 tablet (7.5 mg  total) by mouth daily.  Dispense: 30 tablet; Refill: 3  5. Bilateral carpal tunnel syndrome Wrist splints 24/7 x 1 week then at night.  Will refer if no improvement.  - meloxicam (MOBIC) 7.5 MG tablet; Take 1 tablet (7.5 mg total) by mouth daily.  Dispense: 30 tablet; Refill: 3  6. Language barrier AMN interpreters used and additional time performing visit was required. Mort Sawyers [id number]     Return in about 4 months (around 11/20/2022) for Dr Laural Benes for chronic conditions.  The patient was given clear instructions to go to ER or return to medical center if symptoms don't improve, worsen or new problems develop. The patient verbalized understanding. The patient was told to call to get lab results if they haven't heard anything in the next week.      Georgian Co, PA-C [facility] [place], [place] [phone]   07/20/2022, 10:53 AM

## 2022-08-17 ENCOUNTER — Other Ambulatory Visit: Payer: Self-pay

## 2022-09-01 ENCOUNTER — Ambulatory Visit: Payer: Self-pay

## 2022-09-01 MED ORDER — NIRMATRELVIR/RITONAVIR (PAXLOVID)TABLET
3.0000 | ORAL_TABLET | Freq: Two times a day (BID) | ORAL | 0 refills | Status: AC
  Filled 2022-09-01: qty 30, 5d supply, fill #0

## 2022-09-01 MED ORDER — BENZONATATE 100 MG PO CAPS
100.0000 mg | ORAL_CAPSULE | Freq: Two times a day (BID) | ORAL | 0 refills | Status: DC | PRN
  Filled 2022-09-01: qty 20, 10d supply, fill #0

## 2022-09-01 NOTE — Addendum Note (Signed)
Addended by: Jonah Blue B on: 09/01/2022 06:12 PM   Modules accepted: Orders

## 2022-09-01 NOTE — Telephone Encounter (Signed)
  Chief Complaint: COVID +  Symptoms: HA, Fever, sore throat, sinus congestion Frequency: Last night Pertinent Negatives: Patient denies SOB Disposition: [] ED /[] Urgent Care (no appt availability in office) / [] Appointment(In office/virtual)/ []  [facility] [facility]/ [] Home Care/ [] Refused Recommended Disposition /[x] [place] [facility]/ []  Follow-up with PCP Additional Notes: Pt started feeling poorly last night. This morning woke up feeling worse. Positive home COVID test.  Pt would like to be seen no OV appts available. Pt will go to mobile unit for care.    Reason for Disposition  [1] COVID-19 infection suspected by caller or triager AND [2] mild symptoms (cough, fever, or others) AND [3] negative COVID-19 rapid test  Answer Assessment - Initial Assessment Questions 1. COVID-19 DIAGNOSIS: "How do you know that you have COVID?" (e.g., positive lab test or self-test, diagnosed by doctor or NP/PA, symptoms after exposure).     today 2. COVID-19 EXPOSURE: "Was there any known exposure to COVID before the symptoms began?" CDC Definition of close contact: within 6 feet (2 meters) for a total of 15 minutes or more over a 24-hour period.      *No Answer* 3. ONSET: "When did the COVID-19 symptoms start?"      Last night 4. WORST SYMPTOM: "What is your worst symptom?" (e.g., cough, fever, shortness of breath, muscle aches)     Fever, HA, Sore throat 5. COUGH: "Do you have a cough?" If Yes, ask: "How bad is the cough?"       no 6. FEVER: "Do you have a fever?" If Yes, ask: "What is your temperature, how was it measured, and when did it start?"     Did not take a tempertaure - feels like she has a fever 7. RESPIRATORY STATUS: "Describe your breathing?" (e.g., normal; shortness of breath, wheezing, unable to speak)      Ok.  8. BETTER-SAME-WORSE: "Are you getting better, staying the same or getting worse compared to yesterday?"  If getting worse, ask, "In what way?"     Worse. 9. OTHER  SYMPTOMS: "Do you have any other symptoms?"  (e.g., chills, fatigue, headache, loss of smell or taste, muscle pain, sore throat)     Fever, HA, Sore throat 10. HIGH RISK DISEASE: "Do you have any chronic medical problems?" (e.g., asthma, heart or lung disease, weak immune system, obesity, etc.)       no  Protocols used: Coronavirus (COVID-19) Diagnosed or Suspected-A-AH

## 2022-09-02 ENCOUNTER — Other Ambulatory Visit: Payer: Self-pay

## 2022-09-02 NOTE — Telephone Encounter (Signed)
Call placed to patient unable to reach , unable to leave a message.  Interpreter# [id number]  Call was to advise the following per provider "Let patient know that we can prescribe the antiviral medication called Paxlovid to help shorten the course of the illness and prevent hospitalization.  The medication can sometimes can cause diarrhea and change in taste.  I will send the prescription to our pharmacy.  HOLD of on taking ATORVASTATIN while on Paxlovid.  Okay to restart 1 wk after completing Paxlovid.  Use Tylenol for body aches and fever.  I have sent prescription for Tessalon Perles cough tablets to our pharmacy as well.  Be seen if any shortness of breath."  Please inform patient if she calls back.

## 2022-09-08 ENCOUNTER — Other Ambulatory Visit: Payer: Self-pay

## 2022-09-09 ENCOUNTER — Other Ambulatory Visit: Payer: Self-pay

## 2022-09-20 ENCOUNTER — Other Ambulatory Visit: Payer: Self-pay

## 2022-10-19 ENCOUNTER — Other Ambulatory Visit: Payer: Self-pay

## 2022-10-20 ENCOUNTER — Other Ambulatory Visit: Payer: Self-pay

## 2022-11-17 ENCOUNTER — Other Ambulatory Visit: Payer: Self-pay | Admitting: Internal Medicine

## 2022-11-17 DIAGNOSIS — K219 Gastro-esophageal reflux disease without esophagitis: Secondary | ICD-10-CM

## 2022-11-18 ENCOUNTER — Other Ambulatory Visit: Payer: Self-pay

## 2022-11-18 MED ORDER — OMEPRAZOLE 20 MG PO CPDR
DELAYED_RELEASE_CAPSULE | Freq: Every day | ORAL | 0 refills | Status: DC
  Filled 2022-11-18: qty 90, 90d supply, fill #0

## 2022-11-18 NOTE — Telephone Encounter (Signed)
Requested Prescriptions  Pending Prescriptions Disp Refills   omeprazole (PRILOSEC) 20 MG capsule 90 capsule 0    Sig: Take 1 capsule by mouth daily.     Gastroenterology: Proton Pump Inhibitors Passed - 11/17/2022  8:38 PM      Passed - Valid encounter within last 12 months    Recent Outpatient Visits           4 months ago Type 2 diabetes mellitus with obesity [facility])   [facility] [place], [place], [place]   8 months ago Type 2 diabetes mellitus with obesity [facility])   [place] [facility] Jonah Blue B, MD   1 year ago Type 2 diabetes mellitus with obesity [facility])   [facility] Marcine Matar, MD   1 year ago Type 2 diabetes mellitus with obesity [facility])   [place] [facility] Marcine Matar, MD   1 year ago Need for shingles vaccine   [facility] Drucilla Chalet, RPH-CPP       Future Appointments             In 4 days Marcine Matar, MD [facility]

## 2022-11-22 ENCOUNTER — Ambulatory Visit: Payer: Self-pay | Attending: Internal Medicine | Admitting: Internal Medicine

## 2022-11-22 ENCOUNTER — Other Ambulatory Visit: Payer: Self-pay

## 2022-11-22 ENCOUNTER — Encounter: Payer: Self-pay | Admitting: Internal Medicine

## 2022-11-22 VITALS — BP 111/74 | HR 77 | Temp 97.8°F | Ht 61.0 in | Wt 186.0 lb

## 2022-11-22 DIAGNOSIS — Z7984 Long term (current) use of oral hypoglycemic drugs: Secondary | ICD-10-CM

## 2022-11-22 DIAGNOSIS — E119 Type 2 diabetes mellitus without complications: Secondary | ICD-10-CM

## 2022-11-22 DIAGNOSIS — E785 Hyperlipidemia, unspecified: Secondary | ICD-10-CM

## 2022-11-22 DIAGNOSIS — Z7985 Long-term (current) use of injectable non-insulin antidiabetic drugs: Secondary | ICD-10-CM

## 2022-11-22 DIAGNOSIS — Z23 Encounter for immunization: Secondary | ICD-10-CM

## 2022-11-22 DIAGNOSIS — E669 Obesity, unspecified: Secondary | ICD-10-CM

## 2022-11-22 DIAGNOSIS — E1169 Type 2 diabetes mellitus with other specified complication: Secondary | ICD-10-CM

## 2022-11-22 LAB — POCT GLYCOSYLATED HEMOGLOBIN (HGB A1C): HbA1c, POC (controlled diabetic range): 6.4 % (ref 0.0–7.0)

## 2022-11-22 LAB — GLUCOSE, POCT (MANUAL RESULT ENTRY): POC Glucose: 138 mg/dL — AB (ref 70–99)

## 2022-11-22 MED ORDER — INFLUENZA VIRUS VACC SPLIT PF (FLUZONE) 0.5 ML IM SUSY
0.5000 mL | PREFILLED_SYRINGE | Freq: Once | INTRAMUSCULAR | 0 refills | Status: AC
  Filled 2022-11-22: qty 0.5, 1d supply, fill #0

## 2022-11-22 NOTE — Progress Notes (Signed)
Patient ID: [name] [name], female    DOB: 09-Aug-1960  MRN: [id number]  CC: Diabetes (DM f/u. /No questions / concerns/Instructed to go to our pharm for flu vax.)   Subjective: [name] [name] is a 62 y.o. female who presents for chronic ds management. Her concerns today include:  Pt with hx of DM type 2, obesity, NAFLD, HL, GERD.   AMN Language interpreter used during this encounter. # Alecia Lemming [id number]  DM:  Results for orders placed or performed in visit on 11/22/22  POCT glycosylated hemoglobin (Hb A1C)  Result Value Ref Range   Hemoglobin A1C     HbA1c POC (<> result, manual entry)     HbA1c, POC (prediabetic range)     HbA1c, POC (controlled diabetic range) 6.4 0.0 - 7.0 %  POCT glucose (manual entry)  Result Value Ref Range   POC Glucose 138 (A) 70 - 99 mg/dl  Patient on metformin 1 g daily and Trulicity 1.5 mg once a week. Reports compliance.  Doing well with eating habits but slips up at times. Not exercising daily as she was on past visit.  Gets home from work very tired.  Works at [employer]; does a lot of standing and walking at work. HL/fatty liver: Reports compliance with atorvastatin 40 mg daily. Tolerating it well. Last LD was 71 Over due for DM eye exam.  No insurance.   HM: yes to flu shot.  Advise to get from our pharmacy down stairs Patient Active Problem List   Diagnosis Date Noted   Hyperlipidemia associated with type 2 diabetes mellitus (HCC) 06/06/2019   Screening breast examination 09/27/2018   Obesity (BMI 30-39.9) 09/29/2016   NAFL (nonalcoholic fatty liver) 06/16/2016   Irritant contact dermatitis due to other chemical products 05/20/2016   Uncontrolled type 2 diabetes mellitus 10/21/2014   Hyperlipidemia 11/04/2013   Environmental allergies 07/12/2013   Bell's palsy 11/06/2012   GERD 11/04/2005     Current Outpatient Medications on File Prior to Visit  Medication Sig Dispense Refill   aspirin 325 MG tablet Take 1 tablet  (325 mg total) by mouth daily. 30 tablet 0   atorvastatin (LIPITOR) 40 MG tablet Take 1 tablet (40 mg total) by mouth daily. 90 tablet 1   benzonatate (TESSALON) 100 MG capsule Take 1 capsule (100 mg total) by mouth 2 (two) times daily as needed for cough. 20 capsule 0   Blood Glucose Monitoring Suppl (TRUE METRIX METER) w/Device KIT Use as directed 1 kit 0   cetirizine (ZYRTEC) 10 MG tablet Take 1 tablet (10 mg total) by mouth 2 (two) times daily. 60 tablet 5   Dulaglutide (TRULICITY) 1.5 MG/0.5ML SOAJ INJECT 1.5 MG INTO THE SKIN ONCE A WEEK. 2 mL 6   glucose blood (TRUE METRIX BLOOD GLUCOSE TEST) test strip Use as instructed 100 each 3   ketotifen (ZADITOR) 0.025 % ophthalmic solution Place 1 drop into the right eye 2 (two) times daily. 10 mL 1   meloxicam (MOBIC) 7.5 MG tablet Take 1 tablet (7.5 mg total) by mouth daily. 30 tablet 3   metFORMIN (GLUCOPHAGE) 1000 MG tablet Take 1 tablet (1,000 mg total) by mouth daily with breakfast. 90 tablet 3   omeprazole (PRILOSEC) 20 MG capsule Take 1 capsule by mouth daily. 90 capsule 0   TRUEplus Lancets 28G MISC Use as directed 100 each 1   [DISCONTINUED] sitaGLIPtin (JANUVIA) 100 MG tablet Take 1 tablet (100 mg total) by mouth daily. 30 tablet 6   No current  facility-administered medications on file prior to visit.    No Known Allergies  Social History   Socioeconomic History   Marital status: Married    Spouse name: Not on file   Number of children: 3   Years of education: Not on file   Highest education level: 9th grade  Occupational History   Not on file  Tobacco Use   Smoking status: Never   Smokeless tobacco: Never  Vaping Use   Vaping status: Never Used  Substance and Sexual Activity   Alcohol use: No   Drug use: No   Sexual activity: Yes    Birth control/protection: None, Surgical  Other Topics Concern   Not on file  Social History Narrative   Not on file   Social Determinants of Health   Financial Resource Strain: Not  on file  Food Insecurity: Food Insecurity Present (01/27/2022)   Hunger Vital Sign    Worried About Running Out of Food in the Last Year: Sometimes true    Ran Out of Food in the Last Year: Sometimes true  Transportation Needs: No Transportation Needs (01/27/2022)   PRAPARE - Administrator, Civil Service (Medical): No    Lack of Transportation (Non-Medical): No  Physical Activity: Not on file  Stress: Not on file  Social Connections: Not on file  Intimate Partner Violence: Not on file    Family History  Problem Relation Age of Onset   Hypertension Mother    Diabetes Mother    Diabetes Father    Diabetes Sister    Diabetes Sister    Diabetes Sister    Diabetes Sister    Diabetes Brother    Colon cancer Neg Hx    Breast cancer Neg Hx     Past Surgical History:  Procedure Laterality Date   TUBAL LIGATION  1995    ROS: Review of Systems Negative except as stated above  PHYSICAL EXAM: BP 111/74 (BP Location: Left Arm, Patient Position: Sitting, Cuff Size: Normal)   Pulse 77   Temp 97.8 F (36.6 C) (Oral)   Ht 5\' 1"  (1.549 m)   Wt 186 lb (84.4 kg)   SpO2 97%   BMI 35.14 kg/m   Wt Readings from Last 3 Encounters:  11/22/22 186 lb (84.4 kg)  07/20/22 187 lb (84.8 kg)  03/01/22 186 lb (84.4 kg)    Physical Exam  General appearance - alert, well appearing, middle age Hispanic FM and in no distress Mental status - normal mood, behavior, speech, dress, motor activity, and thought processes Neck - supple, no significant adenopathy Chest - clear to auscultation, no wheezes, rales or rhonchi, symmetric air entry Heart - normal rate, regular rhythm, normal S1, S2, no murmurs, rubs, clicks or gallops Extremities - peripheral pulses normal, no pedal edema, no clubbing or cyanosis Diabetic Foot Exam - Simple   Simple Foot Form Diabetic Foot exam was performed with the following findings: Yes 11/22/2022 10:50 AM  Visual Inspection No deformities, no ulcerations,  no other skin breakdown bilaterally: Yes Sensation Testing Intact to touch and monofilament testing bilaterally: Yes Pulse Check Posterior Tibialis and Dorsalis pulse intact bilaterally: Yes Comments         Latest Ref Rng & Units 03/01/2022   11:27 AM 03/08/2021    4:57 PM 05/05/2020   12:15 PM  CMP  Glucose 70 - 99 mg/dL 161  096  045   BUN 8 - 27 mg/dL 8  15  11    Creatinine 0.57 -  1.00 mg/dL 1.61  0.96  0.45   Sodium 134 - 144 mmol/L 141  142  142   Potassium 3.5 - 5.2 mmol/L 4.2  4.3  4.2   Chloride 96 - 106 mmol/L 102  103  102   CO2 20 - 29 mmol/L 23  26  23    Calcium 8.7 - 10.3 mg/dL 9.5  9.8  9.6   Total Protein 6.0 - 8.5 g/dL 7.0  7.1  7.2   Total Bilirubin 0.0 - 1.2 mg/dL 0.9  0.9  0.7   Alkaline Phos 44 - 121 IU/L 102  106  109   AST 0 - 40 IU/L 19  17  17    ALT 0 - 32 IU/L 20  22  22     Lipid Panel     Component Value Date/Time   CHOL 142 03/01/2022 1127   TRIG 107 03/01/2022 1127   HDL 52 03/01/2022 1127   CHOLHDL 2.7 03/01/2022 1127   CHOLHDL 4.0 06/04/2014 1055   VLDL 40 06/04/2014 1055   LDLCALC 71 03/01/2022 1127    CBC    Component Value Date/Time   WBC 4.8 03/01/2022 1127   WBC 6.2 07/12/2013 1253   RBC 4.79 03/01/2022 1127   RBC 4.72 07/12/2013 1253   HGB 14.0 03/01/2022 1127   HCT 41.8 03/01/2022 1127   PLT 249 03/01/2022 1127   MCV 87 03/01/2022 1127   MCH 29.2 03/01/2022 1127   MCH 28.8 07/12/2013 1253   MCHC 33.5 03/01/2022 1127   MCHC 34.6 07/12/2013 1253   RDW 12.6 03/01/2022 1127   LYMPHSABS 2.3 07/12/2013 1253   MONOABS 0.6 07/12/2013 1253   EOSABS 0.2 07/12/2013 1253   BASOSABS 0.0 07/12/2013 1253    ASSESSMENT AND PLAN: 1. Type 2 diabetes mellitus with obesity (HCC) At goal.  Continue Trulicity and metformin.  Continue healthy eating habits. - POCT glycosylated hemoglobin (Hb A1C) - POCT glucose (manual entry) - Microalbumin / creatinine urine ratio  2. Long-term (current) use of injectable non-insulin antidiabetic  drugs 3. Diabetes mellitus treated with oral medication (HCC) See #1 above  4. Hyperlipidemia associated with type 2 diabetes mellitus (HCC) Continue atorvastatin 40 mg daily  5. Need for influenza vaccination Advised patient to get this from the pharmacy downstairs.    Patient was given the opportunity to ask questions.  Patient verbalized understanding of the plan and was able to repeat key elements of the plan.   This documentation was completed using Paediatric nurse.  Any transcriptional errors are unintentional.  Orders Placed This Encounter  Procedures   POCT glycosylated hemoglobin (Hb A1C)   POCT glucose (manual entry)     Requested Prescriptions    No prescriptions requested or ordered in this encounter    No follow-ups on file.  Jonah Blue, MD, FACP

## 2022-11-23 LAB — MICROALBUMIN / CREATININE URINE RATIO
Creatinine, Urine: 85.3 mg/dL
Microalb/Creat Ratio: 4 mg/g{creat} (ref 0–29)
Microalbumin, Urine: 3.3 ug/mL

## 2022-11-28 ENCOUNTER — Other Ambulatory Visit: Payer: Self-pay

## 2023-01-02 ENCOUNTER — Other Ambulatory Visit: Payer: Self-pay

## 2023-01-03 ENCOUNTER — Other Ambulatory Visit: Payer: Self-pay

## 2023-02-09 ENCOUNTER — Other Ambulatory Visit: Payer: Self-pay | Admitting: Internal Medicine

## 2023-02-09 ENCOUNTER — Other Ambulatory Visit: Payer: Self-pay

## 2023-02-09 DIAGNOSIS — E1169 Type 2 diabetes mellitus with other specified complication: Secondary | ICD-10-CM

## 2023-02-09 DIAGNOSIS — K219 Gastro-esophageal reflux disease without esophagitis: Secondary | ICD-10-CM

## 2023-02-09 MED ORDER — ATORVASTATIN CALCIUM 40 MG PO TABS
40.0000 mg | ORAL_TABLET | Freq: Every day | ORAL | 1 refills | Status: DC
Start: 2023-02-09 — End: 2023-09-08
  Filled 2023-02-09 – 2023-04-20 (×2): qty 90, 90d supply, fill #0
  Filled 2023-08-07 (×2): qty 90, 90d supply, fill #1

## 2023-02-09 MED ORDER — OMEPRAZOLE 20 MG PO CPDR
DELAYED_RELEASE_CAPSULE | Freq: Every day | ORAL | 0 refills | Status: DC
Start: 2023-02-09 — End: 2023-09-08
  Filled 2023-02-09 – 2023-08-07 (×2): qty 90, 90d supply, fill #0

## 2023-02-09 NOTE — Telephone Encounter (Signed)
Requested Prescriptions  Pending Prescriptions Disp Refills   atorvastatin (LIPITOR) 40 MG tablet 90 tablet 1    Sig: Take 1 tablet (40 mg total) by mouth daily.     Cardiovascular:  Antilipid - Statins Failed - 02/09/2023 11:22 AM      Failed - Lipid Panel in normal range within the last 12 months    Cholesterol, Total  Date Value Ref Range Status  03/01/2022 142 100 - 199 mg/dL Final   LDL Chol Calc (NIH)  Date Value Ref Range Status  03/01/2022 71 0 - 99 mg/dL Final   HDL  Date Value Ref Range Status  03/01/2022 52 >39 mg/dL Final   Triglycerides  Date Value Ref Range Status  03/01/2022 107 0 - 149 mg/dL Final         Passed - Patient is not pregnant      Passed - Valid encounter within last 12 months    Recent Outpatient Visits           2 months ago Type 2 diabetes mellitus with obesity (HCC)   [place] [facility] Of [place]. [facility] Jonah Blue B, MD   6 months ago Type 2 diabetes mellitus with obesity [facility])   [place] [facility] - A Dept Of [place]. [facility], Marylene Land M, [place]   11 months ago Type 2 diabetes mellitus with obesity [facility])   [place] [facility] - A Dept Of [place]. [facility] Marcine Matar, MD   1 year ago Type 2 diabetes mellitus with obesity [facility])   [place] [facility] - A Dept Of [place]. [facility] Marcine Matar, MD   1 year ago Type 2 diabetes mellitus with obesity [facility])   [place] [facility] - A Dept Of [place]. [facility] Marcine Matar, MD       Future Appointments             In 1 month Laural Benes Binnie Rail, MD [facility] [place] - A Dept Of [place] Bridegroom. [facility]             omeprazole (PRILOSEC) 20 MG capsule 90 capsule 0    Sig: Take 1 capsule by mouth daily.     Gastroenterology: Proton Pump Inhibitors Passed - 02/09/2023 11:22 AM      Passed - Valid  encounter within last 12 months    Recent Outpatient Visits           2 months ago Type 2 diabetes mellitus with obesity (HCC)   [place] [facility] Of [place]. [facility] Jonah Blue B, MD   6 months ago Type 2 diabetes mellitus with obesity [facility])   [place] [facility] - A Dept Of [place]. [facility], Marylene Land M, [place]   11 months ago Type 2 diabetes mellitus with obesity [facility])   [place] [facility] - A Dept Of [place]. [facility] Marcine Matar, MD   1 year ago Type 2 diabetes mellitus with obesity [facility])   [place] [facility] - A Dept Of [place]. [facility] Marcine Matar, MD   1 year ago Type 2 diabetes mellitus with obesity [facility])   [place] [facility] - A Dept Of [place]. [facility] Marcine Matar, MD       Future Appointments             In 1 month Laural Benes Binnie Rail, MD [facility] [place] - A Dept Of [place] Bridegroom. [facility]

## 2023-02-13 ENCOUNTER — Other Ambulatory Visit: Payer: Self-pay

## 2023-03-07 ENCOUNTER — Telehealth: Payer: Self-pay

## 2023-03-07 NOTE — Telephone Encounter (Signed)
Patient's son and daughter called and requested mammogram scholarship application. [facility]

## 2023-03-13 ENCOUNTER — Other Ambulatory Visit: Payer: Self-pay

## 2023-03-14 ENCOUNTER — Other Ambulatory Visit: Payer: Self-pay

## 2023-03-21 ENCOUNTER — Other Ambulatory Visit: Payer: Self-pay | Admitting: Internal Medicine

## 2023-03-21 DIAGNOSIS — Z1231 Encounter for screening mammogram for malignant neoplasm of breast: Secondary | ICD-10-CM

## 2023-03-23 ENCOUNTER — Ambulatory Visit: Payer: Self-pay | Admitting: Internal Medicine

## 2023-03-23 ENCOUNTER — Encounter: Payer: Self-pay | Admitting: Internal Medicine

## 2023-03-23 ENCOUNTER — Ambulatory Visit: Payer: Self-pay | Attending: Internal Medicine | Admitting: Internal Medicine

## 2023-03-23 VITALS — BP 128/75 | HR 71 | Temp 97.6°F | Ht 61.0 in | Wt 183.0 lb

## 2023-03-23 DIAGNOSIS — Z6834 Body mass index (BMI) 34.0-34.9, adult: Secondary | ICD-10-CM

## 2023-03-23 DIAGNOSIS — E669 Obesity, unspecified: Secondary | ICD-10-CM

## 2023-03-23 DIAGNOSIS — E119 Type 2 diabetes mellitus without complications: Secondary | ICD-10-CM

## 2023-03-23 DIAGNOSIS — E785 Hyperlipidemia, unspecified: Secondary | ICD-10-CM

## 2023-03-23 DIAGNOSIS — Z7984 Long term (current) use of oral hypoglycemic drugs: Secondary | ICD-10-CM

## 2023-03-23 DIAGNOSIS — Z7985 Long-term (current) use of injectable non-insulin antidiabetic drugs: Secondary | ICD-10-CM

## 2023-03-23 LAB — POCT GLYCOSYLATED HEMOGLOBIN (HGB A1C): HbA1c, POC (controlled diabetic range): 6.3 % (ref 0.0–7.0)

## 2023-03-23 LAB — GLUCOSE, POCT (MANUAL RESULT ENTRY): POC Glucose: 127 mg/dL — AB (ref 70–99)

## 2023-03-23 NOTE — Progress Notes (Signed)
 Patient ID: [name] [name], female    DOB: 1960-03-18  MRN: [id number]  CC: Diabetes (DM f/u./Already received flu vax)   Subjective: [name] [name] is a 63 y.o. female who presents for chronic ds management. Her concerns today include:  Pt with hx of DM type 2, obesity, NAFLD, HL, GERD.   AMN Language interpreter used during this encounter. #Manuel [id number]  DM: Results for orders placed or performed in visit on 03/23/23  POCT glucose (manual entry)   Collection Time: 03/23/23  9:11 AM  Result Value Ref Range   POC Glucose 127 (A) 70 - 99 mg/dl  POCT glycosylated hemoglobin (Hb A1C)   Collection Time: 03/23/23  9:19 AM  Result Value Ref Range   Hemoglobin A1C     HbA1c POC (<> result, manual entry)     HbA1c, POC (prediabetic range)     HbA1c, POC (controlled diabetic range) 6.3 0.0 - 7.0 %  Patient on metformin 1 g daily and Trulicity 1.5 mg once a week. Reports compliance.  Tries to adhere to diet but does slip up at times [facility] 3x/wk for exercise.  Down 3 lbs since last visit HL/fatty liver: Reports compliance with atorvastatin 40 mg daily. Tolerating it well. Last LD was 71 in 02/2022 Over due for DM eye exam.  No insurance.    Patient Active Problem List   Diagnosis Date Noted   Hyperlipidemia associated with type 2 diabetes mellitus (HCC) 06/06/2019   Screening breast examination 09/27/2018   Obesity (BMI 30-39.9) 09/29/2016   NAFL (nonalcoholic fatty liver) 06/16/2016   Irritant contact dermatitis due to other chemical products 05/20/2016   Uncontrolled type 2 diabetes mellitus 10/21/2014   Hyperlipidemia 11/04/2013   Environmental allergies 07/12/2013   Bell's palsy 11/06/2012   GERD 11/04/2005     Current Outpatient Medications on File Prior to Visit  Medication Sig Dispense Refill   aspirin 325 MG tablet Take 1 tablet (325 mg total) by mouth daily. 30 tablet 0   atorvastatin (LIPITOR) 40 MG tablet Take 1 tablet (40 mg total) by mouth  daily. 90 tablet 1   Blood Glucose Monitoring Suppl (TRUE METRIX METER) w/Device KIT Use as directed 1 kit 0   cetirizine (ZYRTEC) 10 MG tablet Take 1 tablet (10 mg total) by mouth 2 (two) times daily. 60 tablet 5   Dulaglutide (TRULICITY) 1.5 MG/0.5ML SOAJ INJECT 1.5 MG INTO THE SKIN ONCE A WEEK. 2 mL 6   glucose blood (TRUE METRIX BLOOD GLUCOSE TEST) test strip Use as instructed 100 each 3   meloxicam (MOBIC) 7.5 MG tablet Take 1 tablet (7.5 mg total) by mouth daily. 30 tablet 3   metFORMIN (GLUCOPHAGE) 1000 MG tablet Take 1 tablet (1,000 mg total) by mouth daily with breakfast. 90 tablet 3   omeprazole (PRILOSEC) 20 MG capsule Take 1 capsule by mouth daily. 90 capsule 0   TRUEplus Lancets 28G MISC Use as directed 100 each 1   [DISCONTINUED] sitaGLIPtin (JANUVIA) 100 MG tablet Take 1 tablet (100 mg total) by mouth daily. 30 tablet 6   No current facility-administered medications on file prior to visit.    No Known Allergies  Social History   Socioeconomic History   Marital status: Married    Spouse name: Not on file   Number of children: 3   Years of education: Not on file   Highest education level: 9th grade  Occupational History   Not on file  Tobacco Use   Smoking status: Never  Smokeless tobacco: Never  Vaping Use   Vaping status: Never Used  Substance and Sexual Activity   Alcohol use: No   Drug use: No   Sexual activity: Yes    Birth control/protection: None, Surgical  Other Topics Concern   Not on file  Social History Narrative   Not on file   Social Drivers of Health   Financial Resource Strain: Low Risk  (03/23/2023)   Overall Financial Resource Strain (CARDIA)    Difficulty of Paying Living Expenses: Not very hard  Food Insecurity: No Food Insecurity (03/23/2023)   Hunger Vital Sign    Worried About Running Out of Food in the Last Year: Never true    Ran Out of Food in the Last Year: Never true  Transportation Needs: No Transportation Needs (01/27/2022)    PRAPARE - Administrator, Civil Service (Medical): No    Lack of Transportation (Non-Medical): No  Physical Activity: Inactive (03/23/2023)   Exercise Vital Sign    Days of Exercise per Week: 0 days    Minutes of Exercise per Session: 0 min  Stress: No Stress Concern Present (03/23/2023)   [employer] of Occupational Health - Occupational Stress Questionnaire    Feeling of Stress : Not at all  Social Connections: Socially Integrated (03/23/2023)   Social Connection and Isolation Panel [NHANES]    Frequency of Communication with Friends and Family: Three times a week    Frequency of Social Gatherings with Friends and Family: Three times a week    Attends Religious Services: More than 4 times per year    Active Member of Clubs or Organizations: Yes    Attends Banker Meetings: More than 4 times per year    Marital Status: Married  Catering manager Violence: Not At Risk (03/23/2023)   Humiliation, Afraid, Rape, and Kick questionnaire    Fear of Current or Ex-Partner: No    Emotionally Abused: No    Physically Abused: No    Sexually Abused: No    Family History  Problem Relation Age of Onset   Hypertension Mother    Diabetes Mother    Diabetes Father    Diabetes Sister    Diabetes Sister    Diabetes Sister    Diabetes Sister    Diabetes Brother    Colon cancer Neg Hx    Breast cancer Neg Hx     Past Surgical History:  Procedure Laterality Date   TUBAL LIGATION  1995    ROS: Review of Systems Negative except as stated above  PHYSICAL EXAM: BP 128/75 (BP Location: Left Arm, Patient Position: Sitting, Cuff Size: Normal)   Pulse 71   Temp 97.6 F (36.4 C) (Oral)   Ht 5\' 1"  (1.549 m)   Wt 183 lb (83 kg)   SpO2 99%   BMI 34.58 kg/m   Wt Readings from Last 3 Encounters:  03/23/23 183 lb (83 kg)  11/22/22 186 lb (84.4 kg)  07/20/22 187 lb (84.8 kg)  Physical Exam  General appearance - alert, well appearing, older Hispanic female and in no  distress Mental status - normal mood, behavior, speech, dress, motor activity, and thought processes Chest - clear to auscultation, no wheezes, rales or rhonchi, symmetric air entry Heart - normal rate, regular rhythm, normal S1, S2, no murmurs, rubs, clicks or gallops Extremities - peripheral pulses normal, no pedal edema, no clubbing or cyanosis      Latest Ref Rng & Units 03/01/2022   11:27 AM  03/08/2021    4:57 PM 05/05/2020   12:15 PM  CMP  Glucose 70 - 99 mg/dL 161  096  045   BUN 8 - 27 mg/dL 8  15  11    Creatinine 0.57 - 1.00 mg/dL 4.09  8.11  9.14   Sodium 134 - 144 mmol/L 141  142  142   Potassium 3.5 - 5.2 mmol/L 4.2  4.3  4.2   Chloride 96 - 106 mmol/L 102  103  102   CO2 20 - 29 mmol/L 23  26  23    Calcium 8.7 - 10.3 mg/dL 9.5  9.8  9.6   Total Protein 6.0 - 8.5 g/dL 7.0  7.1  7.2   Total Bilirubin 0.0 - 1.2 mg/dL 0.9  0.9  0.7   Alkaline Phos 44 - 121 IU/L 102  106  109   AST 0 - 40 IU/L 19  17  17    ALT 0 - 32 IU/L 20  22  22     Lipid Panel     Component Value Date/Time   CHOL 142 03/01/2022 1127   TRIG 107 03/01/2022 1127   HDL 52 03/01/2022 1127   CHOLHDL 2.7 03/01/2022 1127   CHOLHDL 4.0 06/04/2014 1055   VLDL 40 06/04/2014 1055   LDLCALC 71 03/01/2022 1127    CBC    Component Value Date/Time   WBC 4.8 03/01/2022 1127   WBC 6.2 07/12/2013 1253   RBC 4.79 03/01/2022 1127   RBC 4.72 07/12/2013 1253   HGB 14.0 03/01/2022 1127   HCT 41.8 03/01/2022 1127   PLT 249 03/01/2022 1127   MCV 87 03/01/2022 1127   MCH 29.2 03/01/2022 1127   MCH 28.8 07/12/2013 1253   MCHC 33.5 03/01/2022 1127   MCHC 34.6 07/12/2013 1253   RDW 12.6 03/01/2022 1127   LYMPHSABS 2.3 07/12/2013 1253   MONOABS 0.6 07/12/2013 1253   EOSABS 0.2 07/12/2013 1253   BASOSABS 0.0 07/12/2013 1253    ASSESSMENT AND PLAN: 1. Type 2 diabetes mellitus with obesity (HCC) (Primary) At goal.  Patient to continue metformin 1 g daily and Trulicity 1.5 mg once a week.  Continue to encourage  healthy eating habits.  Commended her on exercising regularly. - POCT glycosylated hemoglobin (Hb A1C) - POCT glucose (manual entry) - CBC - Comprehensive metabolic panel - Lipid panel  2. Long-term (current) use of injectable non-insulin antidiabetic drugs 3. Diabetes mellitus treated with oral medication (HCC) See #1 above.  4. Hyperlipidemia associated with type 2 diabetes mellitus (HCC) Continue atorvastatin 40 mg daily.    Patient was given the opportunity to ask questions.  Patient verbalized understanding of the plan and was able to repeat key elements of the plan.   This documentation was completed using Paediatric nurse.  Any transcriptional errors are unintentional.  Orders Placed This Encounter  Procedures   CBC   Comprehensive metabolic panel   Lipid panel   POCT glycosylated hemoglobin (Hb A1C)   POCT glucose (manual entry)     Requested Prescriptions    No prescriptions requested or ordered in this encounter    Return in about 4 months (around 07/23/2023).  Jonah Blue, MD, FACP

## 2023-03-24 LAB — CBC
Hematocrit: 41.3 % (ref 34.0–46.6)
Hemoglobin: 13.6 g/dL (ref 11.1–15.9)
MCH: 29.5 pg (ref 26.6–33.0)
MCHC: 32.9 g/dL (ref 31.5–35.7)
MCV: 90 fL (ref 79–97)
Platelets: 258 10*3/uL (ref 150–450)
RBC: 4.61 x10E6/uL (ref 3.77–5.28)
RDW: 13 % (ref 11.7–15.4)
WBC: 4.7 10*3/uL (ref 3.4–10.8)

## 2023-03-24 LAB — COMPREHENSIVE METABOLIC PANEL
ALT: 21 IU/L (ref 0–32)
AST: 22 IU/L (ref 0–40)
Albumin: 4.5 g/dL (ref 3.9–4.9)
Alkaline Phosphatase: 118 IU/L (ref 44–121)
BUN/Creatinine Ratio: 17 (ref 12–28)
BUN: 10 mg/dL (ref 8–27)
Bilirubin Total: 1 mg/dL (ref 0.0–1.2)
CO2: 23 mmol/L (ref 20–29)
Calcium: 9.7 mg/dL (ref 8.7–10.3)
Chloride: 104 mmol/L (ref 96–106)
Creatinine, Ser: 0.59 mg/dL (ref 0.57–1.00)
Globulin, Total: 2.6 g/dL (ref 1.5–4.5)
Glucose: 135 mg/dL — ABNORMAL HIGH (ref 70–99)
Potassium: 4.2 mmol/L (ref 3.5–5.2)
Sodium: 142 mmol/L (ref 134–144)
Total Protein: 7.1 g/dL (ref 6.0–8.5)
eGFR: 102 mL/min/{1.73_m2} (ref >59)

## 2023-03-24 LAB — LIPID PANEL
Chol/HDL Ratio: 2.9 ratio (ref 0.0–4.4)
Cholesterol, Total: 138 mg/dL (ref 100–199)
HDL: 48 mg/dL (ref >39)
LDL Chol Calc (NIH): 68 mg/dL (ref 0–99)
Triglycerides: 126 mg/dL (ref 0–149)
VLDL Cholesterol Cal: 22 mg/dL (ref 5–40)

## 2023-04-06 ENCOUNTER — Ambulatory Visit: Payer: Self-pay

## 2023-04-13 ENCOUNTER — Ambulatory Visit
Admission: RE | Admit: 2023-04-13 | Discharge: 2023-04-13 | Disposition: A | Discharge status: Home / Self Care | Payer: Self-pay | Source: Ambulatory Visit | Attending: Internal Medicine | Admitting: Internal Medicine

## 2023-04-13 ENCOUNTER — Other Ambulatory Visit: Payer: Self-pay | Admitting: Physician Assistant

## 2023-04-13 ENCOUNTER — Other Ambulatory Visit: Payer: Self-pay

## 2023-04-13 DIAGNOSIS — Z1231 Encounter for screening mammogram for malignant neoplasm of breast: Secondary | ICD-10-CM

## 2023-04-13 DIAGNOSIS — E669 Obesity, unspecified: Secondary | ICD-10-CM

## 2023-04-13 MED ORDER — TRULICITY 1.5 MG/0.5ML ~~LOC~~ SOAJ
1.5000 mg | SUBCUTANEOUS | 6 refills | Status: DC
  Filled 2023-04-13: qty 2, 28d supply, fill #0
  Filled 2023-05-11: qty 2, 28d supply, fill #1
  Filled 2023-06-24 – 2023-06-26 (×2): qty 2, 28d supply, fill #2

## 2023-04-18 ENCOUNTER — Other Ambulatory Visit: Payer: Self-pay | Admitting: Internal Medicine

## 2023-04-18 ENCOUNTER — Other Ambulatory Visit: Payer: Self-pay

## 2023-04-18 DIAGNOSIS — R928 Other abnormal and inconclusive findings on diagnostic imaging of breast: Secondary | ICD-10-CM

## 2023-04-20 ENCOUNTER — Other Ambulatory Visit: Payer: Self-pay

## 2023-04-21 ENCOUNTER — Other Ambulatory Visit: Payer: Self-pay

## 2023-04-22 NOTE — Progress Notes (Signed)
 Let pt know that recent MMG showed area of concern in RT breast. [facility] will be calling her, if they have not done so already, to give appt for her to return to have additional imaging done of RT breast to evaluate further.

## 2023-04-25 ENCOUNTER — Ambulatory Visit: Payer: Self-pay | Admitting: Hematology and Oncology

## 2023-04-25 NOTE — Progress Notes (Unsigned)
 [name] is a 63 y.o. female who presents to [facility] clinic today with {Blank single:19197::"no complaints","complaint of"} ***.    Pap Smear: Pap not smear completed today. Last Pap smear was *** at *** clinic and was {Blank single:19197::"normal","abnormal - ***"}. Per patient has {Blank single:19197::"no history","history"} of an abnormal Pap smear. Last Pap smear result {Blank single:19197::"is available in","is not available in"} Epic.   Physical exam: Breasts Breasts symmetrical. No skin abnormalities bilateral breasts. No nipple retraction bilateral breasts. No nipple discharge bilateral breasts. No lymphadenopathy. No lumps palpated bilateral breasts.       Pelvic/Bimanual Pap is not indicated today    Smoking History: Patient has {Blank single:19197::"never smoked","is a former smoker","is a current smoker at *** packs per day"} ***referred to quit line.    Patient Navigation: Patient education provided. Access to services provided for patient through [facility] program. *** interpreter provided. *** transportation provided   Colorectal Cancer Screening: Per patient {Blank single:19197::"has had colonoscopy completed on ***","has never had colonoscopy completed"} No complaints today.    Breast and Cervical Cancer Risk Assessment: Patient {Blank single:19197::"has","does not have"} family history of breast cancer, known genetic mutations, or radiation treatment to the chest before age 12. Patient {Blank single:19197::"has","does not have"} history of cervical dysplasia, immunocompromised, or DES exposure in-utero.  Risk Scores as of Encounter on 04/25/2023     [name] [name] as of 01/27/2022           5-year 1.07%   Lifetime 5.18%   This patient is Hisp[place]/Latina but has no documented birth country, so the [place] model used data from [place] patients to calculate their risk score. Document a birth country in the Demographics activity for a more accurate score.         Last  calculated by [name] [name], CMA on 01/27/2022 at  2:12 PM        A: [facility] exam without pap smear Complaint of ***  P: [facility] to [facility] for a {Blank single:19197::"screening","diagnostic"} mammogram. Appointment scheduled ***.  [name], NP 04/25/2023 1:53 PM

## 2023-05-09 ENCOUNTER — Other Ambulatory Visit: Payer: Self-pay | Admitting: Obstetrics and Gynecology

## 2023-05-09 ENCOUNTER — Ambulatory Visit
Admission: RE | Admit: 2023-05-09 | Discharge: 2023-05-09 | Disposition: A | Discharge status: Home / Self Care | Source: Ambulatory Visit | Attending: Internal Medicine | Admitting: Internal Medicine

## 2023-05-09 DIAGNOSIS — R928 Other abnormal and inconclusive findings on diagnostic imaging of breast: Secondary | ICD-10-CM

## 2023-05-09 DIAGNOSIS — R921 Mammographic calcification found on diagnostic imaging of breast: Secondary | ICD-10-CM

## 2023-05-11 ENCOUNTER — Other Ambulatory Visit: Payer: Self-pay

## 2023-05-12 ENCOUNTER — Ambulatory Visit
Admission: RE | Admit: 2023-05-12 | Discharge: 2023-05-12 | Disposition: A | Discharge status: Home / Self Care | Source: Ambulatory Visit | Attending: Obstetrics and Gynecology | Admitting: Obstetrics and Gynecology

## 2023-05-12 DIAGNOSIS — R921 Mammographic calcification found on diagnostic imaging of breast: Secondary | ICD-10-CM

## 2023-05-12 HISTORY — PX: BREAST BIOPSY: SHX20

## 2023-05-16 ENCOUNTER — Other Ambulatory Visit: Payer: Self-pay

## 2023-05-16 LAB — SURGICAL PATHOLOGY

## 2023-05-17 ENCOUNTER — Telehealth: Payer: Self-pay | Admitting: *Deleted

## 2023-05-17 NOTE — Telephone Encounter (Signed)
 Spoke to patient using interpreting services to confirm upcoming afternoon [facility] clinic appointment on 5/7, paperwork will be sent via mail.  Gave location and time, also informed patient that the surgeon's office would be calling as well to get information from them similar to the packet that they will be receiving so make sure to do both.  Reminded patient that all providers will be coming to the clinic to see them HERE and if they had any questions to not hesitate to reach back out to myself or their navigators.

## 2023-05-22 ENCOUNTER — Encounter: Payer: Self-pay | Admitting: *Deleted

## 2023-05-22 DIAGNOSIS — D0511 Intraductal carcinoma in situ of right breast: Secondary | ICD-10-CM | POA: Insufficient documentation

## 2023-05-22 NOTE — Progress Notes (Incomplete)
 Radiation Oncology         [phone] ________________________________  Name: [name] [name]        MRN: [id number]  Date of Service: 05/24/2023 DOB: [birth year]  QI:[id number], Rexine Cater, MD  Lawrance Presume, MD     REFERRING PHYSICIAN: Lawrance Presume, MD   DIAGNOSIS: There were no encounter diagnoses.   HISTORY OF PRESENT ILLNESS: [name] [name] is a 63 y.o. female seen in the multidisciplinary breast clinic for a new diagnosis of right breast cancer. The patient was noted to have screening detected calcifications in the right breast. In the left breast, no suspicious findings were noted.  She returned for diagnostic mammogram on 05/09/2023 and this showed a 6.5 cm cyst and of predominantly punctate calcifications in segmental distribution located in the outer right breast.  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  The prognostic profile showed that the tumor was ER/PR positive.  She is seen to discuss treatment recommendations.    PREVIOUS RADIATION THERAPY: {EXAM; YES/NO:19492::"No"}   PAST MEDICAL HISTORY:  Past Medical History:  Diagnosis Date   Diabetes mellitus without complication (HCC)    Hyperlipidemia    Medical history non-contributory        PAST SURGICAL HISTORY: Past Surgical History:  Procedure Laterality Date   BREAST BIOPSY Right 05/12/2023   MM RT BREAST BX W LOC DEV EA AD LESION IMG BX SPEC STEREO GUIDE 05/12/2023 [facility]   BREAST BIOPSY Right 05/12/2023   MM RT BREAST BX W LOC DEV 1ST LESION IMAGE BX SPEC STEREO GUIDE 05/12/2023 [facility]   TUBAL LIGATION  1995     FAMILY HISTORY:  Family History  Problem Relation Age of Onset   Hypertension Mother    Diabetes Mother    Diabetes Father    Diabetes  Sister    Diabetes Sister    Diabetes Sister    Diabetes Sister    Diabetes Brother    Colon cancer Neg Hx    Breast cancer Neg Hx      SOCIAL HISTORY:  reports that she has never smoked. She has never used smokeless tobacco. She reports that she does not drink alcohol and does not use drugs.  The patient is married.  She works at [employer]***.  She is originally from[place]and Spanish is her native language.   ALLERGIES: Patient has no known allergies.   MEDICATIONS:  Current Outpatient Medications  Medication Sig Dispense Refill   aspirin  325 MG tablet Take 1 tablet (325 mg total) by mouth daily. 30 tablet 0   atorvastatin  (LIPITOR) 40 MG tablet Take 1 tablet (40 mg total) by mouth daily. 90 tablet 1   Blood Glucose Monitoring Suppl (TRUE METRIX METER) w/Device KIT Use as directed 1 kit 0   cetirizine  (ZYRTEC ) 10 MG tablet Take 1 tablet (10 mg total) by mouth 2 (two) times daily. 60 tablet 5   Dulaglutide  (TRULICITY ) 1.5 MG/0.5ML SOAJ INJECT 1.5 MG INTO THE SKIN ONCE A WEEK. 2 mL 6   glucose blood (TRUE METRIX BLOOD GLUCOSE TEST) test strip Use as instructed 100 each 3   meloxicam  (MOBIC ) 7.5 MG tablet Take 1 tablet (7.5 mg total) by mouth daily. 30 tablet 3   metFORMIN  (GLUCOPHAGE ) 1000 MG tablet Take 1 tablet (1,000 mg total) by mouth daily with breakfast. 90 tablet 3  omeprazole  (PRILOSEC) 20 MG capsule Take 1 capsule by mouth daily. 90 capsule 0   TRUEplus Lancets 28G MISC Use as directed 100 each 1   No current facility-administered medications for this visit.     REVIEW OF SYSTEMS: On review of systems, the patient reports that she is doing ***     PHYSICAL EXAM:  Wt Readings from Last 3 Encounters:  04/25/23 183 lb (83 kg)  03/23/23 183 lb (83 kg)  11/22/22 186 lb (84.4 kg)   Temp Readings from Last 3 Encounters:  03/23/23 97.6 F (36.4 C) (Oral)  11/22/22 97.8 F (36.6 C) (Oral)  07/20/22 98 F (36.7 C) (Oral)   BP Readings from Last 3 Encounters:   04/25/23 (!) 144/60  03/23/23 128/75  11/22/22 111/74   Pulse Readings from Last 3 Encounters:  03/23/23 71  11/22/22 77  07/20/22 68    In general this is a well appearing Hispanic female in no acute distress. She's alert and oriented x4 and appropriate throughout the examination. Cardiopulmonary assessment is negative for acute distress and she exhibits normal effort. Bilateral breast exam is deferred.    ECOG = ***  0 - Asymptomatic (Fully active, able to carry on all predisease activities without restriction)  1 - Symptomatic but completely ambulatory (Restricted in physically strenuous activity but ambulatory and able to carry out work of a light or sedentary nature. For example, light housework, office work)  2 - Symptomatic, <50% in bed during the day (Ambulatory and capable of all self care but unable to carry out any work activities. Up and about more than 50% of waking hours)  3 - Symptomatic, >50% in bed, but not bedbound (Capable of only limited self-care, confined to bed or chair 50% or more of waking hours)  4 - Bedbound (Completely disabled. Cannot carry on any self-care. Totally confined to bed or chair)  5 - Death   Aurea Blossom MM, Creech RH, Tormey DC, et al. [phone]). "Toxicity and response criteria of the [facility]". Am. Hillard Lowes. Oncol. 5 (6): 649-55    LABORATORY DATA:  Lab Results  Component Value Date   WBC 4.7 03/23/2023   HGB 13.6 03/23/2023   HCT 41.3 03/23/2023   MCV 90 03/23/2023   PLT 258 03/23/2023   Lab Results  Component Value Date   NA 142 03/23/2023   K 4.2 03/23/2023   CL 104 03/23/2023   CO2 23 03/23/2023   Lab Results  Component Value Date   ALT 21 03/23/2023   AST 22 03/23/2023   ALKPHOS 118 03/23/2023   BILITOT 1.0 03/23/2023      RADIOGRAPHY: MM RT BREAST BX W LOC DEV 1ST LESION IMAGE BX SPEC STEREO GUIDE Addendum Date: 05/16/2023 ADDENDUM REPORT: 05/16/2023 12:29 ADDENDUM: Pathology revealed DUCTAL  CARCINOMA IN SITU, 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 This was found to be concordant by Dr. Severiano Danish. 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 This was found to be concordant by Dr. Severiano Danish. Pathology results were discussed with the patient by telephone by [facility], [place], ID # [phone]. The patient reported doing well after the biopsies with minimal tenderness at the sites. Post biopsy instructions and care were reviewed and questions  were answered. The patient was encouraged to call [facility] for any additional concerns. My direct phone number was provided. The patient was referred to [facility] on May 24, 2023. Consideration for a bilateral breast MRI for further evaluation of extent of disease. Pathology results reported by Kraig Peru, RN on 05/16/2023. Electronically Signed   By: Sundra Engel M.D.   On: 05/16/2023 12:29   Result Date: 05/16/2023 CLINICAL DATA:  64 year old female presents for stereotactic guided biopsy of posterior and anterior extent of OUTER RIGHT breast calcifications spanning 6.5 cm. EXAM: RIGHT BREAST STEREOTACTIC CORE NEEDLE BIOPSY X 2 COMPARISON:  Previous exam(s). FINDINGS: The patient and I discussed the procedure of stereotactic-guided biopsy including benefits and alternatives. We discussed the high likelihood of a successful procedure. We discussed the risks of the procedure including infection, bleeding, tissue injury, clip migration, and inadequate sampling. Informed written consent was given. The usual  time out protocol was performed immediately prior to the procedures. RIGHT BREAST STEREOTACTIC CORE NEEDLE BIOPSY #1 (posterior extent of OUTER RIGHT breast calcifications-X clip) Using sterile technique and 1% Lidocaine with and without epinephrine as local anesthetic, under stereotactic guidance, a 9 gauge vacuum assisted device was used to perform core needle biopsy of the posterior extent of OUTER RIGHT breast calcifications spanning 6.5 cm using a LATERAL approach. Specimen radiograph was performed showing calcifications. Specimens with calcifications are identified for pathology. At the conclusion of the procedure, an X shaped tissue marker clip was deployed into the biopsy cavity. Follow-up 2-view mammogram was performed and dictated separately. RIGHT BREAST STEREOTACTIC CORE NEEDLE BIOPSY #2 (anterior extent of OUTER RIGHT breast calcifications-COIL clip) Using sterile technique and 1% Lidocaine with and without epinephrine as local anesthetic, under stereotactic guidance, a 9 gauge vacuum assisted device was used to perform core needle biopsy of the anterior extent of OUTER RIGHT breast calcifications spanning 6.5 cm using a LATERAL approach. Specimen radiograph was performed showing calcifications. Specimens with calcifications are identified for pathology. At the conclusion of the procedure, a COIL shaped tissue marker clip was deployed into the biopsy cavity. Follow-up 2-view mammogram was performed and dictated separately. IMPRESSION: Stereotactic-guided biopsy of posterior extent of OUTER RIGHT breast calcifications (X clip). Stereotactic guided biopsy of anterior extent of OUTER RIGHT breast calcifications (COIL clip). No apparent complications. Electronically Signed: By: Sundra Engel M.D. On: 05/12/2023 12:01   MM RT BREAST BX W LOC DEV EA AD LESION IMG BX SPEC STEREO GUIDE Addendum Date: 05/16/2023 ADDENDUM REPORT: 05/16/2023 12:29 ADDENDUM: Pathology revealed DUCTAL CARCINOMA IN SITU, LOW TO  INTERMEDIATE NUCLEAR GRADE, CRIBRIFORM AND PAPILLARY TYPES WITHOUT NECROSIS, MICROCALCIFICATIONS PRESENT WITHIN DCIS of the RIGHT breast, outer, posterior extent, (x clip). This was found to be concordant by Dr. Severiano Danish. 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 This was found to be concordant by Dr. Severiano Danish. Pathology results were discussed with the patient by telephone by [facility], [place], ID # [phone]. The patient reported doing well after the biopsies with minimal tenderness at the sites. Post biopsy instructions and care were reviewed and questions were answered. The patient was encouraged to call [facility] for any additional concerns. My direct phone number was provided. The patient was referred to [facility] on May 24, 2023. Consideration for a bilateral breast MRI for further evaluation of extent of disease. Pathology results reported by Kraig Peru, RN on 05/16/2023. Electronically Signed   By: Sundra Engel M.D.   On: 05/16/2023 12:29   Result Date: 05/16/2023 CLINICAL DATA:  63 year old female presents for stereotactic guided biopsy of posterior and anterior extent of OUTER RIGHT breast calcifications spanning 6.5 cm. EXAM: RIGHT BREAST STEREOTACTIC CORE NEEDLE BIOPSY X 2 COMPARISON:  Previous exam(s). FINDINGS: The patient and I discussed the procedure of stereotactic-guided biopsy including benefits and alternatives. We discussed the high likelihood of a successful procedure. We discussed the risks of the procedure including infection, bleeding, tissue injury, clip migration, and inadequate sampling. Informed written consent was given. The usual time out protocol was  performed immediately prior to the procedures. RIGHT BREAST STEREOTACTIC CORE NEEDLE BIOPSY #1 (posterior extent of OUTER RIGHT breast calcifications-X clip) Using sterile technique and 1% Lidocaine with and without epinephrine as local anesthetic, under stereotactic guidance, a 9 gauge vacuum assisted device was used to perform core needle biopsy of the posterior extent of OUTER RIGHT breast calcifications spanning 6.5 cm using a LATERAL approach. Specimen radiograph was performed showing calcifications. Specimens with calcifications are identified for pathology. At the conclusion of the procedure, an X shaped tissue marker clip was deployed into the biopsy cavity. Follow-up 2-view mammogram was performed and dictated separately. RIGHT BREAST STEREOTACTIC CORE NEEDLE BIOPSY #2 (anterior extent of OUTER RIGHT breast calcifications-COIL clip) Using sterile technique and 1% Lidocaine with and without epinephrine as local anesthetic, under stereotactic guidance, a 9 gauge vacuum assisted device was used to perform core needle biopsy of the anterior extent of OUTER RIGHT breast calcifications spanning 6.5 cm using a LATERAL approach. Specimen radiograph was performed showing calcifications. Specimens with calcifications are identified for pathology. At the conclusion of the procedure, a COIL shaped tissue marker clip was deployed into the biopsy cavity. Follow-up 2-view mammogram was performed and dictated separately. IMPRESSION: Stereotactic-guided biopsy of posterior extent of OUTER RIGHT breast calcifications (X clip). Stereotactic guided biopsy of anterior extent of OUTER RIGHT breast calcifications (COIL clip). No apparent complications. Electronically Signed: By: Sundra Engel M.D. On: 05/12/2023 12:01   MM CLIP PLACEMENT RIGHT Result Date: 05/12/2023 CLINICAL DATA:  Evaluate placement of biopsy clips following stereotactic guided RIGHT breast biopsies. EXAM: 3D DIAGNOSTIC RIGHT MAMMOGRAM POST STEREOTACTIC BIOPSY  COMPARISON:  Previous exam(s). ACR Breast Density Category b: There are scattered areas of fibroglandular density. FINDINGS: 3D Mammographic images were obtained following stereotactic guided biopsy of the posterior extent of OUTER RIGHT breast calcifications (X clip) and the anterior extent of OUTER RIGHT breast calcifications (COIL clip). The X biopsy marking clip is migrated 4 cm MEDIAL to the biopsy cavity. The COIL biopsy marking clip is in satisfactory position at the site of biopsy. IMPRESSION: X biopsy clip migration 4 cm MEDIAL to the biopsy cavity. Appropriate positioning of the COIL shaped biopsy marking clip at the site of biopsy in the anterior OUTER RIGHT breast. Final Assessment: Post Procedure Mammograms for Marker Placement Electronically Signed   By: Sundra Engel M.D.   On: 05/12/2023 12:04   MS DIGITAL DIAG UNI RIGHT Result Date: 05/09/2023 CLINICAL DATA:  63 year old female presenting as a recall from screening for right breast calcifications. EXAM: DIGITAL DIAGNOSTIC UNILATERAL RIGHT MAMMOGRAM TECHNIQUE: Right digital diagnostic mammography was performed. COMPARISON:  Previous exam(s). ACR Breast Density Category b: There are scattered areas of fibroglandular  density. FINDINGS: Spot 2D magnification views of the right breast were performed demonstrating persistence of multiple tiny groups of predominantly punctate calcifications in a segmental distribution in the outer right breast. These span approximately 6.5 cm. No associated mass or distortion. IMPRESSION: Indeterminate segmental calcifications spanning 6.5 cm in the outer right breast. RECOMMENDATION: Stereotactic core needle biopsy x2 targeting the posterior and anterior aspect of the calcifications. I have discussed the findings and recommendations with the patient. If applicable, a reminder letter will be sent to the patient regarding the next appointment. BI-RADS CATEGORY  4: Suspicious. Electronically Signed   By: Allena Ito  M.D.   On: 05/09/2023 10:38       IMPRESSION/PLAN: 1. Low to Intermediate Grade, ER/PR positive DCIS of the right breast. Dr. Jeryl Moris discusses the pathology findings and reviews the nature of *** breast disease. The consensus from the breast conference includes breast conservation with lumpectomy. Dr. Jeryl Moris recommends external radiotherapy to the breast  to reduce risks of local recurrence. Dr. Lee Public anticipates adjuvant antiestrogen therapy to follow. We discussed the risks, benefits, short, and long term effects of radiotherapy, as well as the curative intent, and the patient is interested in proceeding. Dr. Jeryl Moris discusses the delivery and logistics of radiotherapy and anticipates a course of 4 weeks of radiotherapy to the right breast. We will see her back a few weeks after surgery to discuss the simulation process and anticipate we starting radiotherapy about 4-6 weeks after surgery.      In a visit lasting *** minutes, greater than 50% of the time was spent face to face reviewing her case, as well as in preparation of, discussing, and coordinating the patient's care.  The above documentation reflects my direct findings during this shared patient visit. Please see the separate note by Dr. Jeryl Moris on this date for the remainder of the patient's plan of care.    Shelvia Dick, [facility]    **Disclaimer: This note was dictated with voice recognition software. Similar sounding words can inadvertently be transcribed and this note may contain transcription errors which may not have been corrected upon publication of note.**

## 2023-05-24 ENCOUNTER — Inpatient Hospital Stay: Payer: Self-pay

## 2023-05-24 ENCOUNTER — Ambulatory Visit
Admission: RE | Admit: 2023-05-24 | Discharge: 2023-05-24 | Disposition: A | Discharge status: Home / Self Care | Payer: Self-pay | Source: Ambulatory Visit | Attending: Radiation Oncology | Admitting: Radiation Oncology

## 2023-05-24 ENCOUNTER — Inpatient Hospital Stay: Payer: Self-pay | Attending: Hematology and Oncology | Admitting: Hematology and Oncology

## 2023-05-24 ENCOUNTER — Ambulatory Visit: Payer: Self-pay | Admitting: Physical Therapy

## 2023-05-24 ENCOUNTER — Telehealth: Payer: Self-pay | Admitting: Genetic Counselor

## 2023-05-24 ENCOUNTER — Other Ambulatory Visit: Payer: Self-pay | Admitting: *Deleted

## 2023-05-24 ENCOUNTER — Encounter: Payer: Self-pay | Admitting: *Deleted

## 2023-05-24 ENCOUNTER — Encounter: Payer: Self-pay | Admitting: General Practice

## 2023-05-24 VITALS — BP 139/62 | HR 84 | Temp 98.5°F | Resp 18 | Ht 61.0 in | Wt 182.1 lb

## 2023-05-24 DIAGNOSIS — D0511 Intraductal carcinoma in situ of right breast: Secondary | ICD-10-CM

## 2023-05-24 DIAGNOSIS — Z170 Estrogen receptor positive status [ER+]: Secondary | ICD-10-CM

## 2023-05-24 LAB — CMP (CANCER CENTER ONLY)
ALT: 32 U/L (ref 0–44)
AST: 26 U/L (ref 15–41)
Albumin: 4.7 g/dL (ref 3.5–5.0)
Alkaline Phosphatase: 105 U/L (ref 38–126)
Anion gap: 7 (ref 5–15)
BUN: 13 mg/dL (ref 8–23)
CO2: 31 mmol/L (ref 22–32)
Calcium: 9.9 mg/dL (ref 8.9–10.3)
Chloride: 102 mmol/L (ref 98–111)
Creatinine: 0.7 mg/dL (ref 0.44–1.00)
GFR, Estimated: >60 mL/min (ref >60)
Glucose, Bld: 156 mg/dL — ABNORMAL HIGH (ref 70–99)
Potassium: 4 mmol/L (ref 3.5–5.1)
Sodium: 140 mmol/L (ref 135–145)
Total Bilirubin: 1.1 mg/dL (ref 0.0–1.2)
Total Protein: 7.7 g/dL (ref 6.5–8.1)

## 2023-05-24 LAB — CBC WITH DIFFERENTIAL (CANCER CENTER ONLY)
Abs Immature Granulocytes: 0.01 10*3/uL (ref 0.00–0.07)
Basophils Absolute: 0 10*3/uL (ref 0.0–0.1)
Basophils Relative: 1 %
Eosinophils Absolute: 0.2 10*3/uL (ref 0.0–0.5)
Eosinophils Relative: 3 %
HCT: 39.7 % (ref 36.0–46.0)
Hemoglobin: 13.5 g/dL (ref 12.0–15.0)
Immature Granulocytes: 0 %
Lymphocytes Relative: 25 %
Lymphs Abs: 1.5 10*3/uL (ref 0.7–4.0)
MCH: 29.3 pg (ref 26.0–34.0)
MCHC: 34 g/dL (ref 30.0–36.0)
MCV: 86.1 fL (ref 80.0–100.0)
Monocytes Absolute: 0.4 10*3/uL (ref 0.1–1.0)
Monocytes Relative: 6 %
Neutro Abs: 3.9 10*3/uL (ref 1.7–7.7)
Neutrophils Relative %: 65 %
Platelet Count: 268 10*3/uL (ref 150–400)
RBC: 4.61 MIL/uL (ref 3.87–5.11)
RDW: 13.1 % (ref 11.5–15.5)
WBC Count: 6 10*3/uL (ref 4.0–10.5)
nRBC: 0 % (ref 0.0–0.2)

## 2023-05-24 LAB — GENETIC SCREENING ORDER

## 2023-05-24 NOTE — Telephone Encounter (Signed)
 [name] [name] was seen by a genetic counselor during the breast multidisciplinary clinic on May 24, 2023. She did not report any known family history of cancer.   She does not meet NCCN criteria for genetic testing at this time. She was still offered genetic counseling and testing but declined. We encourage her to contact us  if there are any changes to her personal or family history of cancer. If she meets NCCN criteria based on the updated personal/family history, she would be recommended to have genetic counseling and testing.

## 2023-05-24 NOTE — Progress Notes (Signed)
 [facility] CONSULT NOTE  Patient Care Team: Lawrance Presume, MD as PCP - General (Internal Medicine) Brannock, Blaine Bump, RN Adelaide Adjutant, NP as Nurse Practitioner (Hematology and Oncology) Alane Hsu, RN as Oncology Nurse Navigator Auther Bo, RN as Oncology Nurse Navigator Enid Harry, MD as Consulting Physician (General Surgery) Cameron Cea, MD as Consulting Physician (Hematology and Oncology) Johna Myers, MD as Consulting Physician (Radiation Oncology)  CHIEF COMPLAINTS/PURPOSE OF CONSULTATION:  Newly diagnosed right breast DCIS  HISTORY OF PRESENTING ILLNESS:   [name] is a 63 year old Hispanic speaking lady who presented with a screening mammogram detected right breast calcification extending from the nipple laterally with the span of 7 cm.  Anterior and posterior biopsies were positive for intermediate grade DCIS that is ER/PR positive.  She was presented this morning at the multidisciplinary tumor board and she is here today accompanied by her son to discuss treatment plan.  We had a interpreter in the room to help us  with communication.   I reviewed her records extensively and collaborated the history with the patient.  SUMMARY OF ONCOLOGIC HISTORY: Oncology History  Ductal carcinoma in situ (DCIS) of right breast  05/12/2023 Initial Diagnosis   Screening mammogram detected right breast calcification extending from nipple towards the back coarse heterogeneous calcifications measuring 7 cm.  Anterior and posterior biopsies revealed low-grade intermediate grade DCIS ER 100%, PR 20%   05/24/2023 Cancer Staging   Staging form: Breast, AJCC 8th Edition - Clinical: Stage 0 (cTis (DCIS), cN0, cM0, ER+, PR+, HER2: Not Assessed) - Signed by Cameron Cea, MD on 05/24/2023 Stage prefix: Initial diagnosis Nuclear grade: G2      MEDICAL HISTORY:  Past Medical History:  Diagnosis Date   Diabetes mellitus without complication  (HCC)    Hyperlipidemia    Medical history non-contributory     SURGICAL HISTORY: Past Surgical History:  Procedure Laterality Date   BREAST BIOPSY Right 05/12/2023   MM RT BREAST BX W LOC DEV EA AD LESION IMG BX SPEC STEREO GUIDE 05/12/2023 [facility] MAMMOGRAP[facility]   BREAST BIOPSY Right 05/12/2023   MM RT BREAST BX W LOC DEV 1ST LESION IMAGE BX SPEC STEREO GUIDE 05/12/2023 [facility]GR[facility]   TUBAL LIGATION  1995    SOCIAL HISTORY: Social History   Socioeconomic History   Marital status: Married    Spouse name: Not on file   Number of children: 3   Years of education: Not on file   Highest education level: 9th grade  Occupational History   Not on file  Tobacco Use   Smoking status: Never   Smokeless tobacco: Never  Vaping Use   Vaping status: Never Used  Substance and Sexual Activity   Alcohol use: No   Drug use: No   Sexual activity: Yes    Birth control/protection: None, Surgical, Post-menopausal  Other Topics Concern   Not on file  Social History Narrative   Not on file   Social Drivers of Health   Financial Resource Strain: Low Risk  (03/23/2023)   Overall Financial Resource Strain (CARDIA)    Difficulty of Paying Living Expenses: Not very hard  Food Insecurity: No Food Insecurity (04/25/2023)   Hunger Vital Sign    Worried About Running Out of Food in the Last Year: Never true    Ran Out of Food in the Last Year: Never true  Transportation Needs: No Transportation Needs (04/25/2023)   PRAPARE - Administrator, Civil Service (Medical): No  Lack of Transportation (Non-Medical): No  Physical Activity: Inactive (03/23/2023)   Exercise Vital Sign    Days of Exercise per Week: 0 days    Minutes of Exercise per Session: 0 min  Stress: No Stress Concern Present (03/23/2023)   [employer] of Occupational Health - Occupational Stress Questionnaire    Feeling of Stress : Not at all  Social Connections: Socially Integrated (03/23/2023)   Social Connection and  Isolation Panel [NHANES]    Frequency of Communication with Friends and Family: Three times a week    Frequency of Social Gatherings with Friends and Family: Three times a week    Attends Religious Services: More than 4 times per year    Active Member of Clubs or Organizations: Yes    Attends Banker Meetings: More than 4 times per year    Marital Status: Married  Catering manager Violence: Not At Risk (03/23/2023)   Humiliation, Afraid, Rape, and Kick questionnaire    Fear of Current or Ex-Partner: No    Emotionally Abused: No    Physically Abused: No    Sexually Abused: No    FAMILY HISTORY: Family History  Problem Relation Age of Onset   Hypertension Mother    Diabetes Mother    Diabetes Father    Diabetes Sister    Diabetes Sister    Diabetes Sister    Diabetes Sister    Diabetes Brother    Colon cancer Neg Hx    Breast cancer Neg Hx     ALLERGIES:  has no known allergies.  MEDICATIONS:  Current Outpatient Medications  Medication Sig Dispense Refill   aspirin  325 MG tablet Take 1 tablet (325 mg total) by mouth daily. 30 tablet 0   atorvastatin  (LIPITOR) 40 MG tablet Take 1 tablet (40 mg total) by mouth daily. 90 tablet 1   Blood Glucose Monitoring Suppl (TRUE METRIX METER) w/Device KIT Use as directed 1 kit 0   cetirizine  (ZYRTEC ) 10 MG tablet Take 1 tablet (10 mg total) by mouth 2 (two) times daily. 60 tablet 5   glucose blood (TRUE METRIX BLOOD GLUCOSE TEST) test strip Use as instructed 100 each 3   meloxicam  (MOBIC ) 7.5 MG tablet Take 1 tablet (7.5 mg total) by mouth daily. 30 tablet 3   metFORMIN  (GLUCOPHAGE ) 1000 MG tablet Take 1,000 mg by mouth 2 (two) times daily with a meal.     omeprazole  (PRILOSEC) 20 MG capsule Take 1 capsule by mouth daily. 90 capsule 0   TRUEplus Lancets 28G MISC Use as directed 100 each 1   Dulaglutide  (TRULICITY ) 1.5 MG/0.5ML SOAJ INJECT 1.5 MG INTO THE SKIN ONCE A WEEK. (Patient not taking: Reported on 05/24/2023) 2 mL 6    metFORMIN  (GLUCOPHAGE ) 1000 MG tablet Take 1 tablet (1,000 mg total) by mouth daily with breakfast. 90 tablet 3   No current facility-administered medications for this visit.    REVIEW OF SYSTEMS:   Constitutional: Denies fevers, chills or abnormal night sweats Breast:  Denies any palpable lumps or discharge All other systems were reviewed with the patient and are negative.  PHYSICAL EXAMINATION: ECOG PERFORMANCE STATUS: 1 - Symptomatic but completely ambulatory  Vitals:   05/24/23 1313  BP: 139/62  Pulse: 84  Resp: 18  Temp: 98.5 F (36.9 C)  SpO2: 98%   Filed Weights   05/24/23 1313  Weight: 182 lb 1.6 oz (82.6 kg)    GENERAL:alert, no distress and comfortable    LABORATORY DATA:  I have reviewed  the data as listed Lab Results  Component Value Date   WBC 6.0 05/24/2023   HGB 13.5 05/24/2023   HCT 39.7 05/24/2023   MCV 86.1 05/24/2023   PLT 268 05/24/2023   Lab Results  Component Value Date   NA 140 05/24/2023   K 4.0 05/24/2023   CL 102 05/24/2023   CO2 31 05/24/2023    RADIOGRAPHIC STUDIES: I have personally reviewed the radiological reports and agreed with the findings in the report.  ASSESSMENT AND PLAN:  Ductal carcinoma in situ (DCIS) of right breast 05/12/2023:Screening mammogram detected right breast calcification extending from nipple towards the back coarse heterogeneous calcifications measuring 7 cm.  Anterior and posterior biopsies revealed low-grade intermediate grade DCIS ER 100%, PR 20%  Pathology review: I discussed with the patient the difference between DCIS and invasive breast cancer. It is considered a precancerous lesion. DCIS is classified as a 0. It is generally detected through mammograms as calcifications. We discussed the significance of grades and its impact on prognosis. We also discussed the importance of ER and PR receptors and their implications to adjuvant treatment options. Prognosis of DCIS dependence on grade, comedo necrosis.  It is anticipated that if not treated, 20-30% of DCIS can develop into invasive breast cancer.  Recommendation: 1. Breast conserving surgery 2. Followed by adjuvant radiation therapy 3. Followed by antiestrogen therapy with anastrozole x 5 years (patient may have had facial nerve paralysis unclear if it is related to stroke and therefore we decided not to do tamoxifen)  Anastrozole counseling: We discussed the risks and benefits of anti-estrogen therapy with aromatase inhibitors. These include but not limited to insomnia, hot flashes, mood changes, vaginal dryness, bone density loss, and weight gain. We strongly believe that the benefits far outweigh the risks. Patient understands these risks and consented to starting treatment. Planned treatment duration is 5 years.   Return to clinic after radiation   Assessment and Plan Assessment & Plan      All questions were answered. The patient knows to call the clinic with any problems, questions or concerns.    Viinay K Nyomie Ehrlich, MD 05/24/23

## 2023-05-24 NOTE — Progress Notes (Signed)
 [facility] Multidisciplinary Clinic Spiritual Care Note  Met with [name] [name], her son [name] [name], and an interpreter in Breast Multidisciplinary Clinic to introduce Support Center team/resources.  Nelva completed SDOH screening; results follow below.  SDOH Interventions    Flowsheet Row Office Visit from 03/23/2023 in [facility]  SDOH Interventions   Food Insecurity Interventions Intervention Not Indicated  Housing Interventions Intervention Not Indicated  Utilities Interventions Intervention Not Indicated  Alcohol Usage Interventions Intervention Not Indicated (Score <7)  Financial Strain Interventions Intervention Not Indicated  Physical Activity Interventions [employer]  Stress Interventions Intervention Not Indicated  Social Connections Interventions Intervention Not Indicated  Health Literacy Interventions Intervention Not Indicated       SDOH Screenings   Food Insecurity: No Food Insecurity (05/24/2023)  Housing: High Risk (05/24/2023)  Transportation Needs: No Transportation Needs (05/24/2023)  Utilities: Not At Risk (05/24/2023)  Alcohol Screen: Low Risk  (03/23/2023)  Depression (PHQ2-9): Low Risk  (03/23/2023)  Financial Resource Strain: Low Risk  (03/23/2023)  Physical Activity: Inactive (03/23/2023)  Social Connections: Socially Integrated (03/23/2023)  Stress: No Stress Concern Present (03/23/2023)  Tobacco Use: Low Risk  (04/25/2023)  Health Literacy: Adequate Health Literacy (03/23/2023)   Chaplain and patient discussed common feelings and emotions when being diagnosed with cancer, and the importance of support during treatment.  Chaplain informed patient of the support team and support services at [facility].  Chaplain provided contact information and encouraged patient to call with any questions or concerns.  Follow up needed: Yes.  Will place Social Work referral per family's request and plan to phone again in ca two weeks for Spiritual Care  check-in.   [address] Dorice Gardner, [place], [facility] Pager [phone] Voicemail [phone]

## 2023-05-24 NOTE — Assessment & Plan Note (Signed)
 05/12/2023:Screening mammogram detected right breast calcification extending from nipple towards the back coarse heterogeneous calcifications measuring 7 cm.  Anterior and posterior biopsies revealed low-grade intermediate grade DCIS ER 100%, PR 20%  Pathology review: I discussed with the patient the difference between DCIS and invasive breast cancer. It is considered a precancerous lesion. DCIS is classified as a 0. It is generally detected through mammograms as calcifications. We discussed the significance of grades and its impact on prognosis. We also discussed the importance of ER and PR receptors and their implications to adjuvant treatment options. Prognosis of DCIS dependence on grade, comedo necrosis. It is anticipated that if not treated, 20-30% of DCIS can develop into invasive breast cancer.  Recommendation: 1. Breast conserving surgery 2. Followed by adjuvant radiation therapy 3. Followed by antiestrogen therapy with tamoxifen 5 years  Tamoxifen counseling: We discussed the risks and benefits of tamoxifen. These include but not limited to insomnia, hot flashes, mood changes, vaginal dryness, and weight gain. Although rare, serious side effects including endometrial cancer, risk of blood clots were also discussed. We strongly believe that the benefits far outweigh the risks. Patient understands these risks and consented to starting treatment. Planned treatment duration is 5 years.  Return to clinic after surgery to discuss the final pathology report and come up with an adjuvant treatment plan.

## 2023-05-25 ENCOUNTER — Inpatient Hospital Stay: Payer: Self-pay | Admitting: Licensed Clinical Social Worker

## 2023-05-25 NOTE — Progress Notes (Signed)
 [facility] Clinical Social Work  Clinical Social Work was referred by spiritual care for assessment of psychosocial needs.  Clinical Social Worker contacted patient by phone to offer support and assess for needs.   Utilized Research officer, trade union Psychologist, prison and probation services interpreter ID# [phone]). Pt's daughter-in-law Vonzell Guerin was also on the phone and speaks [place].   Pt/ caregiver wanted to know if pt can or needs to apply for any other assistance beyond the [facility] Financial assistance/ pink card. CSW informed them that the surgeon is through [facility] and it would be beneficial to complete that application as well.  Also discussed various cancer foundations that may provide help once pt is in treatment (Cancer Services, [place], [employer] when it re-opens). Sent information to daughter in law via e-mail today per her request.      Dhruti Ghuman E Angelise Petrich, LCSW  Clinical Social Worker [facility]

## 2023-05-26 ENCOUNTER — Ambulatory Visit
Admission: RE | Admit: 2023-05-26 | Discharge: 2023-05-26 | Disposition: A | Discharge status: Home / Self Care | Source: Ambulatory Visit | Attending: General Surgery | Admitting: General Surgery

## 2023-05-26 DIAGNOSIS — D0511 Intraductal carcinoma in situ of right breast: Secondary | ICD-10-CM

## 2023-05-26 MED ORDER — IOPAMIDOL (ISOVUE-370) INJECTION 76%
100.0000 mL | Freq: Once | INTRAVENOUS | Status: AC | PRN
Start: 2023-05-26 — End: 2023-05-26
  Administered 2023-05-26: 100 mL via INTRAVENOUS

## 2023-06-01 ENCOUNTER — Encounter: Payer: Self-pay | Admitting: *Deleted

## 2023-06-01 ENCOUNTER — Telehealth: Payer: Self-pay | Admitting: *Deleted

## 2023-06-01 NOTE — Telephone Encounter (Signed)
 [name] [name] - interpreter to call patient to check in and review appt with Dr. Delane Fear for 5/19 at 8:30am.

## 2023-06-05 ENCOUNTER — Encounter: Payer: Self-pay | Admitting: *Deleted

## 2023-06-05 ENCOUNTER — Other Ambulatory Visit: Payer: Self-pay | Admitting: General Surgery

## 2023-06-06 ENCOUNTER — Telehealth: Payer: Self-pay

## 2023-06-06 NOTE — Progress Notes (Signed)
 Surgical Instructions   Your procedure is scheduled on June 20, 2023. Report to [facility] Main Entrance "A" at 12:30 P.M., then check in with the Admitting office. Any questions or running late day of surgery: call [phone]  Questions prior to your surgery date: call [phone], Monday-Friday, 8am-4pm. If you experience any cold or flu symptoms such as cough, fever, chills, shortness of breath, etc. between now and your scheduled surgery, please notify us  at the above number.     Remember:  Do not eat after midnight the night before your surgery   You may drink clear liquids until 11:30 the morning of your surgery.   Clear liquids allowed are: Water, Non-Citrus Juices (without pulp), Carbonated Beverages, Clear Tea (no milk, honey, etc.), Black Coffee Only (NO MILK, CREAM OR POWDERED CREAMER of any kind), and Gatorade. Patient Instructions  The night before surgery:  No food after midnight. ONLY clear liquids after midnight    The day of surgery (if you have diabetes): Drink ONE (1) 12 oz G2 given to you in your pre admission testing appointment by 11:30 the morning of surgery. Drink in one sitting. Do not sip.  This drink was given to you during your hospital  pre-op appointment visit.  Nothing else to drink after completing the  12 oz bottle of G2.         If you have questions, please contact your surgeon's office.    Take these medicines the morning of surgery with A SIP OF WATER  atorvastatin  (LIPITOR)      May take these medicines IF NEEDED: cetirizine  (ZYRTEC )  omeprazole  (PRILOSEC)   One week prior to surgery, STOP taking any Aspirin  (unless otherwise instructed by your surgeon) Aleve, Naproxen, Ibuprofen, Motrin, Advil, Goody's, BC's, all herbal medications, fish oil, and non-prescription vitamins.meloxicam  (MOBIC )              WHAT DO I DO ABOUT MY DIABETES MEDICATION?   Do not take oral diabetes medicines metFORMIN  (GLUCOPHAGE  the morning of  surgery.  Dulaglutide  (TRULICITY ) LAST DOSE  The day of surgery, do not take other diabetes injectables, including Byetta (exenatide), Bydureon (exenatide ER), Victoza (liraglutide), or Trulicity  (dulaglutide ).  If your CBG is greater than 220 mg/dL, you may take  of your sliding scale (correction) dose of insulin.   HOW TO MANAGE YOUR DIABETES BEFORE AND AFTER SURGERY  Why is it important to control my blood sugar before and after surgery? Improving blood sugar levels before and after surgery helps healing and can limit problems. A way of improving blood sugar control is eating a healthy diet by:  Eating less sugar and carbohydrates  Increasing activity/exercise  Talking with your doctor about reaching your blood sugar goals High blood sugars (greater than 180 mg/dL) can raise your risk of infections and slow your recovery, so you will need to focus on controlling your diabetes during the weeks before surgery. Make sure that the doctor who takes care of your diabetes knows about your planned surgery including the date and location.  How do I manage my blood sugar before surgery? Check your blood sugar at least 4 times a day, starting 2 days before surgery, to make sure that the level is not too high or low.  Check your blood sugar the morning of your surgery when you wake up and every 2 hours until you get to the Short Stay unit.  If your blood sugar is less than 70 mg/dL, you will need to treat for low  blood sugar: Do not take insulin. Treat a low blood sugar (less than 70 mg/dL) with  cup of clear juice (cranberry or apple), 4 glucose tablets, OR glucose gel. Recheck blood sugar in 15 minutes after treatment (to make sure it is greater than 70 mg/dL). If your blood sugar is not greater than 70 mg/dL on recheck, call [phone] for further instructions. Report your blood sugar to the short stay nurse when you get to Short Stay.  If you are admitted to the hospital after  surgery: Your blood sugar will be checked by the staff and you will probably be given insulin after surgery (instead of oral diabetes medicines) to make sure you have good blood sugar levels. The goal for blood sugar control after surgery is 80-180 mg/dL.         Do NOT Smoke (Tobacco/Vaping) for 24 hours prior to your procedure.  If you use a CPAP at night, you may bring your mask/headgear for your overnight stay.   You will be asked to remove any contacts, glasses, piercing's, hearing aid's, dentures/partials prior to surgery. Please bring cases for these items if needed.    Patients discharged the day of surgery will not be allowed to drive home, and someone needs to stay with them for 24 hours.  SURGICAL WAITING ROOM VISITATION Patients may have no more than 2 support people in the waiting area - these visitors may rotate.   Pre-op nurse will coordinate an appropriate time for 1 ADULT support person, who may not rotate, to accompany patient in pre-op.  Children under the age of 77 must have an adult with them who is not the patient and must remain in the main waiting area with an adult.  If the patient needs to stay at the hospital during part of their recovery, the visitor guidelines for inpatient rooms apply.  Please refer to the [facility] website for the visitor guidelines for any additional information.   If you received a COVID test during your pre-op visit  it is requested that you wear a mask when out in public, stay away from anyone that may not be feeling well and notify your surgeon if you develop symptoms. If you have been in contact with anyone that has tested positive in the last 10 days please notify you surgeon.      Pre-operative CHG Bathing Instructions   You can play a key role in reducing the risk of infection after surgery. Your skin needs to be as free of germs as possible. You can reduce the number of germs on your skin by washing with CHG (chlorhexidine  gluconate) soap before surgery. CHG is an antiseptic soap that kills germs and continues to kill germs even after washing.   DO NOT use if you have an allergy to chlorhexidine/CHG or antibacterial soaps. If your skin becomes reddened or irritated, stop using the CHG and notify one of our RNs at [phone].              TAKE A SHOWER THE NIGHT BEFORE SURGERY AND THE DAY OF SURGERY    Please keep in mind the following:  DO NOT shave, including legs and underarms, 48 hours prior to surgery.   You may shave your face before/day of surgery.  Place clean sheets on your bed the night before surgery Use a clean washcloth (not used since being washed) for each shower. DO NOT sleep with pet's night before surgery.  CHG Shower Instructions:  Wash your face  and private area with normal soap. If you choose to wash your hair, wash first with your normal shampoo.  After you use shampoo/soap, rinse your hair and body thoroughly to remove shampoo/soap residue.  Turn the water OFF and apply half the bottle of CHG soap to a CLEAN washcloth.  Apply CHG soap ONLY FROM YOUR NECK DOWN TO YOUR TOES (washing for 3-5 minutes)  DO NOT use CHG soap on face, private areas, open wounds, or sores.  Pay special attention to the area where your surgery is being performed.  If you are having back surgery, having someone wash your back for you may be helpful. Wait 2 minutes after CHG soap is applied, then you may rinse off the CHG soap.  Pat dry with a clean towel  Put on clean pajamas    Additional instructions for the day of surgery: DO NOT APPLY any lotions, deodorants, cologne, or perfumes.   Do not wear jewelry or makeup Do not wear nail polish, gel polish, artificial nails, or any other type of covering on natural nails (fingers and toes) Do not bring valuables to the hospital. [facility] is not responsible for valuables/personal belongings. Put on clean/comfortable clothes.  Please brush your teeth.  Ask your  nurse before applying any prescription medications to the skin.

## 2023-06-06 NOTE — Telephone Encounter (Signed)
 Via, [name] [name], [facility] Spanish Interpreter, Patient was contacted to verify citizenship/resident status to determine if eligible to apply for [facility] as patient has been diagnosed with Breast Cancer. Patient stated that she is not a citizen or resident. Patient informed not eligible to apply [facility] Medicaid but will mail [facility] Assistance application, verified understanding.

## 2023-06-07 ENCOUNTER — Encounter (HOSPITAL_COMMUNITY)
Admission: RE | Admit: 2023-06-07 | Discharge: 2023-06-07 | Disposition: A | Discharge status: Home / Self Care | Source: Ambulatory Visit | Attending: General Surgery | Admitting: General Surgery

## 2023-06-07 ENCOUNTER — Encounter: Payer: Self-pay | Admitting: General Practice

## 2023-06-07 ENCOUNTER — Other Ambulatory Visit: Payer: Self-pay

## 2023-06-07 ENCOUNTER — Encounter (HOSPITAL_COMMUNITY): Payer: Self-pay

## 2023-06-07 VITALS — BP 130/66 | HR 78 | Temp 98.4°F | Ht 61.0 in | Wt 181.6 lb

## 2023-06-07 DIAGNOSIS — Z01818 Encounter for other preprocedural examination: Secondary | ICD-10-CM | POA: Insufficient documentation

## 2023-06-07 HISTORY — DX: Bell's palsy: G51.0

## 2023-06-07 LAB — GLUCOSE, CAPILLARY: Glucose-Capillary: 104 mg/dL — ABNORMAL HIGH (ref 70–99)

## 2023-06-07 LAB — HEMOGLOBIN A1C
Hgb A1c MFr Bld: 5.8 % — ABNORMAL HIGH (ref 4.8–5.6)
Mean Plasma Glucose: 119.76 mg/dL

## 2023-06-07 NOTE — Progress Notes (Signed)
 Surgical Instructions   Your procedure is scheduled on June 20, 2023. Report to [facility] Main Entrance "A" at 12:30 P.M., then check in with the Admitting office. Any questions or running late day of surgery: call [phone]  Questions prior to your surgery date: call [phone], Monday-Friday, 8am-4pm. If you experience any cold or flu symptoms such as cough, fever, chills, shortness of breath, etc. between now and your scheduled surgery, please notify us  at the above number.     Remember:  Do not eat after midnight the night before your surgery   You may drink clear liquids until 11:30 the morning of your surgery.   Clear liquids allowed are: Water, Non-Citrus Juices (without pulp), Carbonated Beverages, Clear Tea (no milk, honey, etc.), Black Coffee Only (NO MILK, CREAM OR POWDERED CREAMER of any kind), and Gatorade. Patient Instructions  The night before surgery:  No food after midnight. ONLY clear liquids after midnight    The day of surgery (if you have diabetes): Drink ONE (1) 12 oz G2 given to you in your pre admission testing appointment by 11:30 the morning of surgery. Drink in one sitting. Do not sip.  This drink was given to you during your hospital  pre-op appointment visit.  Nothing else to drink after completing the  12 oz bottle of G2.         If you have questions, please contact your surgeon's office.    Take these medicines the morning of surgery with A SIP OF WATER  atorvastatin  (LIPITOR)      May take these medicines IF NEEDED: cetirizine  (ZYRTEC )  omeprazole  (PRILOSEC)   One week prior to surgery, STOP taking any Aspirin  (unless otherwise instructed by your surgeon) Aleve, Naproxen, Ibuprofen, Motrin, Advil, Goody's, BC's, all herbal medications, fish oil, and non-prescription vitamins.meloxicam  (MOBIC )              WHAT DO I DO ABOUT MY DIABETES MEDICATION?   Do not take oral diabetes medicines metFORMIN  (GLUCOPHAGE  the morning of  surgery.  Hold Dulaglutide  (TRULICITY ) 7 days prior to surgery. Take your last dose on or before May 26.   HOW TO MANAGE YOUR DIABETES BEFORE AND AFTER SURGERY  Why is it important to control my blood sugar before and after surgery? Improving blood sugar levels before and after surgery helps healing and can limit problems. A way of improving blood sugar control is eating a healthy diet by:  Eating less sugar and carbohydrates  Increasing activity/exercise  Talking with your doctor about reaching your blood sugar goals High blood sugars (greater than 180 mg/dL) can raise your risk of infections and slow your recovery, so you will need to focus on controlling your diabetes during the weeks before surgery. Make sure that the doctor who takes care of your diabetes knows about your planned surgery including the date and location.  How do I manage my blood sugar before surgery? Check your blood sugar at least 4 times a day, starting 2 days before surgery, to make sure that the level is not too high or low.  Check your blood sugar the morning of your surgery when you wake up and every 2 hours until you get to the Short Stay unit.  If your blood sugar is less than 70 mg/dL, you will need to treat for low blood sugar: Do not take insulin. Treat a low blood sugar (less than 70 mg/dL) with  cup of clear juice (cranberry or apple), 4 glucose tablets, OR glucose gel.  Recheck blood sugar in 15 minutes after treatment (to make sure it is greater than 70 mg/dL). If your blood sugar is not greater than 70 mg/dL on recheck, call [phone] for further instructions. Report your blood sugar to the short stay nurse when you get to Short Stay.  If you are admitted to the hospital after surgery: Your blood sugar will be checked by the staff and you will probably be given insulin after surgery (instead of oral diabetes medicines) to make sure you have good blood sugar levels. The goal for blood sugar  control after surgery is 80-180 mg/dL.         Do NOT Smoke (Tobacco/Vaping) for 24 hours prior to your procedure.  If you use a CPAP at night, you may bring your mask/headgear for your overnight stay.   You will be asked to remove any contacts, glasses, piercing's, hearing aid's, dentures/partials prior to surgery. Please bring cases for these items if needed.    Patients discharged the day of surgery will not be allowed to drive home, and someone needs to stay with them for 24 hours.  SURGICAL WAITING ROOM VISITATION Patients may have no more than 2 support people in the waiting area - these visitors may rotate.   Pre-op nurse will coordinate an appropriate time for 1 ADULT support person, who may not rotate, to accompany patient in pre-op.  Children under the age of 78 must have an adult with them who is not the patient and must remain in the main waiting area with an adult.  If the patient needs to stay at the hospital during part of their recovery, the visitor guidelines for inpatient rooms apply.  Please refer to the [facility] website for the visitor guidelines for any additional information.   If you received a COVID test during your pre-op visit  it is requested that you wear a mask when out in public, stay away from anyone that may not be feeling well and notify your surgeon if you develop symptoms. If you have been in contact with anyone that has tested positive in the last 10 days please notify you surgeon.      Pre-operative CHG Bathing Instructions   You can play a key role in reducing the risk of infection after surgery. Your skin needs to be as free of germs as possible. You can reduce the number of germs on your skin by washing with CHG (chlorhexidine gluconate) soap before surgery. CHG is an antiseptic soap that kills germs and continues to kill germs even after washing.   DO NOT use if you have an allergy to chlorhexidine/CHG or antibacterial soaps. If your skin  becomes reddened or irritated, stop using the CHG and notify one of our RNs at [phone].              TAKE A SHOWER THE NIGHT BEFORE SURGERY AND THE DAY OF SURGERY    Please keep in mind the following:  DO NOT shave, including legs and underarms, 48 hours prior to surgery.   You may shave your face before/day of surgery.  Place clean sheets on your bed the night before surgery Use a clean washcloth (not used since being washed) for each shower. DO NOT sleep with pet's night before surgery.  CHG Shower Instructions:  Wash your face and private area with normal soap. If you choose to wash your hair, wash first with your normal shampoo.  After you use shampoo/soap, rinse your hair and body thoroughly  to remove shampoo/soap residue.  Turn the water OFF and apply half the bottle of CHG soap to a CLEAN washcloth.  Apply CHG soap ONLY FROM YOUR NECK DOWN TO YOUR TOES (washing for 3-5 minutes)  DO NOT use CHG soap on face, private areas, open wounds, or sores.  Pay special attention to the area where your surgery is being performed.  If you are having back surgery, having someone wash your back for you may be helpful. Wait 2 minutes after CHG soap is applied, then you may rinse off the CHG soap.  Pat dry with a clean towel  Put on clean pajamas    Additional instructions for the day of surgery: DO NOT APPLY any lotions, deodorants, cologne, or perfumes.   Do not wear jewelry or makeup Do not wear nail polish, gel polish, artificial nails, or any other type of covering on natural nails (fingers and toes) Do not bring valuables to the hospital. [facility] is not responsible for valuables/personal belongings. Put on clean/comfortable clothes.  Please brush your teeth.  Ask your nurse before applying any prescription medications to the skin.

## 2023-06-07 NOTE — Progress Notes (Signed)
 PCP - [employer] [employer] Cardiologist -   PPM/ICD - denies Device Orders -  Rep Notified -   Chest x-ray - na EKG - 06/07/23 Stress Test - denies ECHO - 10/30/20 Cardiac Cath - denies  Sleep Study - denies CPAP -   Fasting Blood Sugar - 110-130 Checks Blood Sugar every morning  Last dose of GLP1 agonist-  take last dose of Trulicity  on May 24.  GLP1 instructions: Hold Trulicity  for 7 days prior to surgery.   Blood Thinner Instructions:na Aspirin  Instructions:pt states she no longer takes Aspirin .   ERAS Protcol -clears until 1130 PRE-SURGERY Ensure or G2- G2  COVID TEST- na   Anesthesia review: no  Patient denies shortness of breath, fever, cough and chest pain at PAT appointment   All instructions explained to the patient, with a verbal understanding of the material. Patient agrees to go over the instructions while at home for a better understanding.The opportunity to ask questions was provided.

## 2023-06-07 NOTE — Progress Notes (Signed)
 [facility] Spiritual Care Note  Attempted [facility] follow-up call with [facility] [phone], but voicemail has not been set up. Will continue trying.   [address] [name] [name], [place], [facility] Pager [phone] Voicemail [phone]

## 2023-06-08 ENCOUNTER — Encounter: Payer: Self-pay | Admitting: General Practice

## 2023-06-08 NOTE — Progress Notes (Signed)
 [facility] Spiritual Care Note  Reached Lavayah by phone with assistance from [facility] [phone] during a break from work. Per her request, plan to follow up this afternoon after her workday.   [address] Dorice Gardner, [place], [facility] Pager [phone] Voicemail [phone]

## 2023-06-08 NOTE — Progress Notes (Signed)
 [facility] Spiritual Care Note  Followed up with Jhoanna by phone with assistance from [facility] [phone]. She reports no particular needs at this time, but does have direct Spiritual Care number and plans to contact chaplain (or have one of her children contact chaplain) if needs arise or circumstances change.   [address] Dorice Gardner, [place], [facility] Pager [phone] Voicemail [phone]

## 2023-06-09 ENCOUNTER — Encounter: Payer: Self-pay | Admitting: *Deleted

## 2023-06-20 ENCOUNTER — Ambulatory Visit (HOSPITAL_COMMUNITY): Admitting: Anesthesiology

## 2023-06-20 ENCOUNTER — Other Ambulatory Visit: Payer: Self-pay

## 2023-06-20 ENCOUNTER — Encounter (HOSPITAL_COMMUNITY)
Admission: RE | Disposition: A | Discharge status: Home / Self Care | Payer: Self-pay | Source: Home / Self Care | Attending: General Surgery

## 2023-06-20 ENCOUNTER — Observation Stay (HOSPITAL_COMMUNITY)
Admission: RE | Admit: 2023-06-20 | Discharge: 2023-06-21 | Disposition: A | Discharge status: Home / Self Care | Attending: General Surgery | Admitting: General Surgery

## 2023-06-20 ENCOUNTER — Encounter (HOSPITAL_COMMUNITY): Payer: Self-pay | Admitting: General Surgery

## 2023-06-20 DIAGNOSIS — D0511 Intraductal carcinoma in situ of right breast: Secondary | ICD-10-CM

## 2023-06-20 DIAGNOSIS — Z7984 Long term (current) use of oral hypoglycemic drugs: Secondary | ICD-10-CM | POA: Insufficient documentation

## 2023-06-20 DIAGNOSIS — E119 Type 2 diabetes mellitus without complications: Secondary | ICD-10-CM | POA: Insufficient documentation

## 2023-06-20 DIAGNOSIS — Z9011 Acquired absence of right breast and nipple: Principal | ICD-10-CM

## 2023-06-20 DIAGNOSIS — Z01818 Encounter for other preprocedural examination: Secondary | ICD-10-CM

## 2023-06-20 DIAGNOSIS — Z79899 Other long term (current) drug therapy: Secondary | ICD-10-CM | POA: Insufficient documentation

## 2023-06-20 HISTORY — PX: TOTAL MASTECTOMY: SHX6129

## 2023-06-20 LAB — GLUCOSE, CAPILLARY
Glucose-Capillary: 104 mg/dL — ABNORMAL HIGH (ref 70–99)
Glucose-Capillary: 111 mg/dL — ABNORMAL HIGH (ref 70–99)

## 2023-06-20 SURGERY — MASTECTOMY, SIMPLE
Anesthesia: General | Site: Breast | Laterality: Right

## 2023-06-20 MED ORDER — ACETAMINOPHEN 500 MG PO TABS
1000.0000 mg | ORAL_TABLET | Freq: Four times a day (QID) | ORAL | Status: DC
  Administered 2023-06-20 – 2023-06-21 (×3): 1000 mg via ORAL
  Filled 2023-06-20 (×3): qty 2

## 2023-06-20 MED ORDER — KETOROLAC TROMETHAMINE 30 MG/ML IJ SOLN: 30.0000 mg | Freq: Once | INTRAMUSCULAR | Status: DC | PRN

## 2023-06-20 MED ORDER — METHOCARBAMOL 1000 MG/10ML IJ SOLN: 500.0000 mg | Freq: Three times a day (TID) | INTRAMUSCULAR | Status: DC

## 2023-06-20 MED ORDER — ORAL CARE MOUTH RINSE: 15.0000 mL | Freq: Once | OROMUCOSAL | Status: AC

## 2023-06-20 MED ORDER — HEMOSTATIC AGENTS (NO CHARGE) OPTIME
TOPICAL | Status: DC | PRN
  Administered 2023-06-20: 1 via TOPICAL

## 2023-06-20 MED ORDER — TRANEXAMIC ACID 1000 MG/10ML IV SOLN
2000.0000 mg | Freq: Once | INTRAVENOUS | Status: AC
  Administered 2023-06-20: 2000 mg via TOPICAL
  Filled 2023-06-20: qty 20

## 2023-06-20 MED ORDER — LIDOCAINE 2% (20 MG/ML) 5 ML SYRINGE
INTRAMUSCULAR | Status: AC
  Filled 2023-06-20: qty 5

## 2023-06-20 MED ORDER — ONDANSETRON HCL 4 MG/2ML IJ SOLN
INTRAMUSCULAR | Status: AC
  Filled 2023-06-20: qty 2

## 2023-06-20 MED ORDER — PHENYLEPHRINE 80 MCG/ML (10ML) SYRINGE FOR IV PUSH (FOR BLOOD PRESSURE SUPPORT)
PREFILLED_SYRINGE | INTRAVENOUS | Status: DC | PRN
  Administered 2023-06-20 (×2): 80 ug via INTRAVENOUS
  Administered 2023-06-20: 120 ug via INTRAVENOUS

## 2023-06-20 MED ORDER — CHLORHEXIDINE GLUCONATE 0.12 % MT SOLN: 15.0000 mL | Freq: Once | OROMUCOSAL | Status: AC

## 2023-06-20 MED ORDER — OXYCODONE HCL 5 MG PO TABS: 5.0000 mg | ORAL_TABLET | Freq: Once | ORAL | Status: DC | PRN

## 2023-06-20 MED ORDER — HYDROMORPHONE HCL 1 MG/ML IJ SOLN
0.2500 mg | INTRAMUSCULAR | Status: DC | PRN
  Administered 2023-06-20: 0.5 mg via INTRAVENOUS

## 2023-06-20 MED ORDER — PROPOFOL 1000 MG/100ML IV EMUL
INTRAVENOUS | Status: AC
Start: 2023-06-20 — End: ?
  Filled 2023-06-20: qty 100

## 2023-06-20 MED ORDER — ONDANSETRON 4 MG PO TBDP: 4.0000 mg | ORAL_TABLET | Freq: Four times a day (QID) | ORAL | Status: DC | PRN

## 2023-06-20 MED ORDER — PHENYLEPHRINE HCL (PRESSORS) 10 MG/ML IV SOLN
INTRAVENOUS | Status: DC | PRN
  Administered 2023-06-20: 120 ug via INTRAVENOUS
  Administered 2023-06-20: 80 ug via INTRAVENOUS

## 2023-06-20 MED ORDER — CHLORHEXIDINE GLUCONATE 0.12 % MT SOLN
OROMUCOSAL | Status: AC
  Administered 2023-06-20: 15 mL via OROMUCOSAL
  Filled 2023-06-20: qty 15

## 2023-06-20 MED ORDER — MIDAZOLAM HCL 2 MG/2ML IJ SOLN
INTRAMUSCULAR | Status: DC | PRN
  Administered 2023-06-20: 1 mg via INTRAVENOUS

## 2023-06-20 MED ORDER — FENTANYL CITRATE (PF) 250 MCG/5ML IJ SOLN
INTRAMUSCULAR | Status: DC | PRN
  Administered 2023-06-20: 75 ug via INTRAVENOUS
  Administered 2023-06-20 (×2): 25 ug via INTRAVENOUS

## 2023-06-20 MED ORDER — ONDANSETRON HCL 4 MG/2ML IJ SOLN
INTRAMUSCULAR | Status: DC | PRN
  Administered 2023-06-20: 4 mg via INTRAVENOUS

## 2023-06-20 MED ORDER — METHOCARBAMOL 500 MG PO TABS
500.0000 mg | ORAL_TABLET | Freq: Three times a day (TID) | ORAL | Status: DC
  Administered 2023-06-20 – 2023-06-21 (×2): 500 mg via ORAL
  Filled 2023-06-20 (×2): qty 1

## 2023-06-20 MED ORDER — INSULIN ASPART 100 UNIT/ML IJ SOLN: 0.0000 [IU] | INTRAMUSCULAR | Status: DC | PRN

## 2023-06-20 MED ORDER — CEFAZOLIN SODIUM-DEXTROSE 2-4 GM/100ML-% IV SOLN
2.0000 g | INTRAVENOUS | Status: AC
  Administered 2023-06-20: 2 g via INTRAVENOUS

## 2023-06-20 MED ORDER — FENTANYL CITRATE (PF) 100 MCG/2ML IJ SOLN: 50.0000 ug | Freq: Once | INTRAMUSCULAR | Status: AC

## 2023-06-20 MED ORDER — CHLORHEXIDINE GLUCONATE CLOTH 2 % EX PADS: 6.0000 | MEDICATED_PAD | Freq: Once | CUTANEOUS | Status: DC

## 2023-06-20 MED ORDER — SIMETHICONE 80 MG PO CHEW: 40.0000 mg | CHEWABLE_TABLET | Freq: Four times a day (QID) | ORAL | Status: DC | PRN

## 2023-06-20 MED ORDER — MAGTRACE LYMPHATIC TRACER
INTRAMUSCULAR | Status: DC | PRN
  Administered 2023-06-20: 2 mL via INTRAMUSCULAR

## 2023-06-20 MED ORDER — PROPOFOL 10 MG/ML IV BOLUS
INTRAVENOUS | Status: DC | PRN
  Administered 2023-06-20: 40 mg via INTRAVENOUS
  Administered 2023-06-20: 150 ug/kg/min via INTRAVENOUS
  Administered 2023-06-20: 150 mg via INTRAVENOUS
  Administered 2023-06-20: 20 mg via INTRAVENOUS

## 2023-06-20 MED ORDER — MIDAZOLAM HCL 2 MG/2ML IJ SOLN
INTRAMUSCULAR | Status: AC
  Filled 2023-06-20: qty 2

## 2023-06-20 MED ORDER — OXYCODONE HCL 5 MG/5ML PO SOLN: 5.0000 mg | Freq: Once | ORAL | Status: DC | PRN

## 2023-06-20 MED ORDER — PANTOPRAZOLE SODIUM 40 MG PO TBEC
40.0000 mg | DELAYED_RELEASE_TABLET | Freq: Every day | ORAL | Status: DC
  Administered 2023-06-20: 40 mg via ORAL
  Filled 2023-06-20: qty 1

## 2023-06-20 MED ORDER — ONDANSETRON HCL 4 MG/2ML IJ SOLN
4.0000 mg | Freq: Four times a day (QID) | INTRAMUSCULAR | Status: DC | PRN
  Administered 2023-06-21: 4 mg via INTRAVENOUS
  Filled 2023-06-20: qty 2

## 2023-06-20 MED ORDER — ACETAMINOPHEN 500 MG PO TABS
ORAL_TABLET | ORAL | Status: AC
  Administered 2023-06-20: 1000 mg via ORAL
  Filled 2023-06-20: qty 2

## 2023-06-20 MED ORDER — DEXAMETHASONE SODIUM PHOSPHATE 10 MG/ML IJ SOLN
INTRAMUSCULAR | Status: DC | PRN
  Administered 2023-06-20: 5 mg via INTRAVENOUS

## 2023-06-20 MED ORDER — LIDOCAINE 2% (20 MG/ML) 5 ML SYRINGE
INTRAMUSCULAR | Status: DC | PRN
  Administered 2023-06-20: 100 mg via INTRAVENOUS

## 2023-06-20 MED ORDER — CEFAZOLIN SODIUM-DEXTROSE 2-4 GM/100ML-% IV SOLN
INTRAVENOUS | Status: AC
Start: 2023-06-20 — End: 2023-06-20
  Filled 2023-06-20: qty 100

## 2023-06-20 MED ORDER — ACETAMINOPHEN 500 MG PO TABS: 1000.0000 mg | ORAL_TABLET | ORAL | Status: AC

## 2023-06-20 MED ORDER — 0.9 % SODIUM CHLORIDE (POUR BTL) OPTIME
TOPICAL | Status: DC | PRN
  Administered 2023-06-20: 1000 mL

## 2023-06-20 MED ORDER — MIDAZOLAM HCL 2 MG/2ML IJ SOLN: 1.0000 mg | Freq: Once | INTRAMUSCULAR | Status: AC

## 2023-06-20 MED ORDER — OXYCODONE HCL 5 MG PO TABS
5.0000 mg | ORAL_TABLET | ORAL | Status: DC | PRN
  Administered 2023-06-20 – 2023-06-21 (×2): 5 mg via ORAL
  Administered 2023-06-21: 10 mg via ORAL
  Filled 2023-06-20: qty 1
  Filled 2023-06-20: qty 2
  Filled 2023-06-20: qty 1

## 2023-06-20 MED ORDER — METFORMIN HCL 500 MG PO TABS: 1000.0000 mg | ORAL_TABLET | Freq: Every day | ORAL | Status: DC

## 2023-06-20 MED ORDER — HYDROMORPHONE HCL 1 MG/ML IJ SOLN
INTRAMUSCULAR | Status: AC
Start: 2023-06-20 — End: ?
  Filled 2023-06-20: qty 1

## 2023-06-20 MED ORDER — FENTANYL CITRATE (PF) 250 MCG/5ML IJ SOLN
INTRAMUSCULAR | Status: AC
  Filled 2023-06-20: qty 5

## 2023-06-20 MED ORDER — ENSURE PRE-SURGERY PO LIQD: 296.0000 mL | Freq: Once | ORAL | Status: DC

## 2023-06-20 MED ORDER — EPHEDRINE SULFATE-NACL 50-0.9 MG/10ML-% IV SOSY
PREFILLED_SYRINGE | INTRAVENOUS | Status: DC | PRN
  Administered 2023-06-20: 7.5 mg via INTRAVENOUS

## 2023-06-20 MED ORDER — MORPHINE SULFATE (PF) 2 MG/ML IV SOLN
2.0000 mg | INTRAVENOUS | Status: DC | PRN
  Administered 2023-06-20: 2 mg via INTRAVENOUS
  Filled 2023-06-20: qty 1

## 2023-06-20 MED ORDER — SODIUM CHLORIDE 0.9 % IV SOLN
INTRAVENOUS | Status: DC
Start: 2023-06-20 — End: 2023-06-21

## 2023-06-20 MED ORDER — FENTANYL CITRATE (PF) 100 MCG/2ML IJ SOLN
INTRAMUSCULAR | Status: AC
  Administered 2023-06-20: 50 ug via INTRAVENOUS
  Filled 2023-06-20: qty 2

## 2023-06-20 MED ORDER — BUPIVACAINE LIPOSOME 1.3 % IJ SUSP
INTRAMUSCULAR | Status: DC | PRN
  Administered 2023-06-20: 10 mL

## 2023-06-20 MED ORDER — DEXMEDETOMIDINE HCL IN NACL 80 MCG/20ML IV SOLN
INTRAVENOUS | Status: DC | PRN
  Administered 2023-06-20 (×2): 4 ug via INTRAVENOUS

## 2023-06-20 MED ORDER — LACTATED RINGERS IV SOLN: INTRAVENOUS | Status: DC

## 2023-06-20 MED ORDER — ONDANSETRON HCL 4 MG/2ML IJ SOLN: 4.0000 mg | Freq: Once | INTRAMUSCULAR | Status: DC | PRN

## 2023-06-20 MED ORDER — MIDAZOLAM HCL 2 MG/2ML IJ SOLN
INTRAMUSCULAR | Status: AC
  Administered 2023-06-20: 1 mg via INTRAVENOUS
  Filled 2023-06-20: qty 2

## 2023-06-20 MED ORDER — BUPIVACAINE HCL (PF) 0.5 % IJ SOLN
INTRAMUSCULAR | Status: DC | PRN
  Administered 2023-06-20: 16 mL

## 2023-06-20 SURGICAL SUPPLY — 37 items
BAG COUNTER SPONGE SURGICOUNT (BAG) ×2 IMPLANT
BINDER BREAST LRG (GAUZE/BANDAGES/DRESSINGS) IMPLANT
BINDER BREAST XLRG (GAUZE/BANDAGES/DRESSINGS) IMPLANT
BIOPATCH RED 1 DISK 7.0 (GAUZE/BANDAGES/DRESSINGS) ×2 IMPLANT
CHLORAPREP W/TINT 26 (MISCELLANEOUS) ×2 IMPLANT
CLIP APPLIE 9.375 MED OPEN (MISCELLANEOUS) ×2 IMPLANT
COVER SURGICAL LIGHT HANDLE (MISCELLANEOUS) ×2 IMPLANT
DERMABOND ADVANCED .7 DNX12 (GAUZE/BANDAGES/DRESSINGS) ×2 IMPLANT
DRAIN CHANNEL 19F RND (DRAIN) ×2 IMPLANT
DRAPE TOP ARMCOVERS (MISCELLANEOUS) ×2 IMPLANT
DRAPE U-SHAPE 76X120 STRL (DRAPES) ×2 IMPLANT
DRSG TEGADERM 4X4.75 (GAUZE/BANDAGES/DRESSINGS) ×2 IMPLANT
ELECT CAUTERY BLADE 6.4 (BLADE) ×2 IMPLANT
ELECTRODE BLDE 4.0 EZ CLN MEGD (MISCELLANEOUS) ×2 IMPLANT
ELECTRODE REM PT RTRN 9FT ADLT (ELECTROSURGICAL) ×2 IMPLANT
EVACUATOR SILICONE 100CC (DRAIN) ×2 IMPLANT
GAUZE PAD ABD 8X10 STRL (GAUZE/BANDAGES/DRESSINGS) ×4 IMPLANT
GLOVE BIO SURGEON STRL SZ7 (GLOVE) ×2 IMPLANT
GLOVE BIOGEL PI IND STRL 7.5 (GLOVE) ×2 IMPLANT
GOWN STRL REUS W/ TWL LRG LVL3 (GOWN DISPOSABLE) ×6 IMPLANT
KIT BASIN OR (CUSTOM PROCEDURE TRAY) ×2 IMPLANT
KIT TURNOVER KIT B (KITS) ×2 IMPLANT
NS IRRIG 1000ML POUR BTL (IV SOLUTION) ×2 IMPLANT
PACK GENERAL/GYN (CUSTOM PROCEDURE TRAY) ×2 IMPLANT
PAD ARMBOARD POSITIONER FOAM (MISCELLANEOUS) ×2 IMPLANT
PENCIL SMOKE EVACUATOR (MISCELLANEOUS) ×2 IMPLANT
POWDER SURGICEL 3.0 GRAM (HEMOSTASIS) IMPLANT
SPONGE T-LAP 18X18 ~~LOC~~+RFID (SPONGE) IMPLANT
STAPLER SKIN PROX 35W (STAPLE) IMPLANT
STRIP CLOSURE SKIN 1/2X4 (GAUZE/BANDAGES/DRESSINGS) ×2 IMPLANT
SUT ETHILON 2 0 FS 18 (SUTURE) ×2 IMPLANT
SUT MNCRL AB 4-0 PS2 18 (SUTURE) ×2 IMPLANT
SUT SILK 2 0 SH (SUTURE) ×2 IMPLANT
SUT VIC AB 3-0 SH 18 (SUTURE) ×2 IMPLANT
TOWEL GREEN STERILE (TOWEL DISPOSABLE) ×2 IMPLANT
TOWEL GREEN STERILE FF (TOWEL DISPOSABLE) ×2 IMPLANT
TRACER MAGTRACE VIAL (MISCELLANEOUS) IMPLANT

## 2023-06-20 NOTE — Anesthesia Procedure Notes (Signed)
 Procedure Name: LMA Insertion Date/Time: 06/20/2023 3:08 PM  Performed by: Saquoia Sianez A, CRNAPre-anesthesia Checklist: Patient identified, Emergency Drugs available, Suction available and Patient being monitored Patient Re-evaluated:Patient Re-evaluated prior to induction Oxygen Delivery Method: Circle System Utilized Preoxygenation: Pre-oxygenation with 100% oxygen Induction Type: IV induction Ventilation: Mask ventilation without difficulty LMA: LMA inserted LMA Size: 4.0 Number of attempts: 1 Airway Equipment and Method: Bite block Placement Confirmation: positive ETCO2 Tube secured with: Tape Dental Injury: Teeth and Oropharynx as per pre-operative assessment

## 2023-06-20 NOTE — Plan of Care (Signed)
  Problem: Activity: Goal: Risk for activity intolerance will decrease Outcome: Progressing   Problem: Coping: Goal: Level of anxiety will decrease Outcome: Progressing   Problem: Pain Managment: Goal: General experience of comfort will improve and/or be controlled Outcome: Progressing   Problem: Safety: Goal: Ability to remain free from injury will improve Outcome: Progressing

## 2023-06-20 NOTE — Discharge Instructions (Signed)
[facility] [phone]  MASTECTOMY: POST OP INSTRUCTIONS Take 400 mg of ibuprofen every 8 hours or 650 mg tylenol every 6 hours for next 72 hours then as needed. Use ice several times daily also. Always review your discharge instruction sheet given to you by the facility where your surgery was performed.  A prescription for pain medication may be given to you upon discharge.  Take your pain medication as prescribed, if needed.  If narcotic pain medicine is not needed, then you may take acetaminophen (Tylenol), naprosyn (Alleve) or ibuprofen (Advil) as needed. Take your usually prescribed medications unless otherwise directed. If you need a refill on your pain medication, please contact your pharmacy.  They will contact our office to request authorization.  Prescriptions will not be filled after 5pm or on week-ends. You should follow a light diet the first 24 hours after surgery.  Resume your normal diet the day after surgery. Most patients will experience some swelling and bruising on the chest and underarm.  Ice packs will help.  Swelling and bruising can take several days to resolve. Wear the binder or [place] bra for 72 hours day and night. After night please wear during the day.  It is common to experience some constipation if taking pain medication after surgery.  Increasing fluid intake and taking a stool softener (such as Colace) will usually help or prevent this problem from occurring.  A mild laxative (Milk of Magnesia or Miralax) should be taken according to package instructions if there are no bowel movements after 48 hours. There is glue and steristrips on your incision. They will come off in the next few weeks.  You may take a shower 48 hours after surgery.  Any sutures will be removed at an office visit DRAINS:  If you have drains in place, it is important to keep a list of the amount of drainage produced each day in your drains.  Before leaving the hospital, you  should be instructed on drain care.  Call our office if you have any questions about your drains. I will remove your drains when they put out less than 30 cc or ml for 2 consecutive days. ACTIVITIES:  You may resume regular (light) daily activities beginning the next day--such as daily self-care, walking, climbing stairs--gradually increasing activities as tolerated.  You may have sexual intercourse when it is comfortable.  Refrain from any heavy lifting or straining until approved by your doctor. You may drive when you are no longer taking prescription pain medication, you can comfortably wear a seatbelt, and you can safely maneuver your car and apply brakes. RETURN TO WORK:  __________________________________________________________ You should see your doctor in the office for a follow-up appointment approximately 3-5 days after your surgery.  Your doctor's nurse will typically make your follow-up appointment when she calls you with your pathology report.  Expect your pathology report 3-4business days after surgery. OTHER INSTRUCTIONS: ______________________________________________________________________________________________ ____________________________________________________________________________________________ WHEN TO CALL YOUR DR Milanni Ayub: Fever over 101.0 Nausea and/or vomiting Extreme swelling or bruising Continued bleeding from incision. Increased pain, redness, or drainage from the incision. The clinic staff is available to answer your questions during regular business hours.  Please don't hesitate to call and ask to speak to one of the nurses for clinical concerns.  If you have a medical emergency, go to the nearest emergency room or call 911.  A surgeon from [facility] is always on call at the hospital. [address], [address], [place], [place]    [address] ? [address] [address], [place], [place]   [address] [phone] ? [phone] ? FAX [phone] Web site:  [email or web address]    About my Jackson-Pratt Bulb Drain  What is a Jackson-Pratt bulb? A Jackson-Pratt is a soft, round device used to collect drainage. It is connected to a long, thin drainage catheter, which is held in place by one or two small stiches near your surgical incision site. When the bulb is squeezed, it forms a vacuum, forcing the drainage to empty into the bulb.  Emptying the Jackson-Pratt bulb- To empty the bulb: 1. Release the plug on the top of the bulb. 2. Pour the bulb's contents into a measuring container which your nurse will provide. 3. Record the time emptied and amount of drainage. Empty the drain(s) as often as your     doctor or nurse recommends.  Date                  Time                    Amount (Drain 1)                 Amount (Drain 2)  _____________________________________________________________________  _____________________________________________________________________  _____________________________________________________________________  _____________________________________________________________________  _____________________________________________________________________  _____________________________________________________________________  _____________________________________________________________________  _____________________________________________________________________  Squeezing the Jackson-Pratt Bulb- To squeeze the bulb: 1. Make sure the plug at the top of the bulb is open. 2. Squeeze the bulb tightly in your fist. You will hear air squeezing from the bulb. 3. Replace the plug while the bulb is squeezed. 4. Use a safety pin to attach the bulb to your clothing. This will keep the catheter from     pulling at the bulb insertion site.  When to call your doctor- Call your doctor if: Drain site becomes red, swollen or hot. You have a fever greater than 101 degrees F. There is oozing at the drain  site. Drain falls out (apply a guaze bandage over the drain hole and secure it with tape). Drainage increases daily not related to activity patterns. (You will usually have more drainage when you are active than when you are resting.) Drainage has a bad odor.   

## 2023-06-20 NOTE — Interval H&P Note (Signed)
 History and Physical Interval Note:  06/20/2023 2:05 PM  [name] [name]  has presented today for surgery, with the diagnosis of RIGHT BREAST DCIS.  The various methods of treatment have been discussed with the patient and family. After consideration of risks, benefits and other options for treatment, the patient has consented to  Procedure(s) with comments: MASTECTOMY, SIMPLE (Right) - RIGHT MASTECTOMY GEN w/PEC BLOCK as a surgical intervention.  The patient's history has been reviewed, patient examined, no change in status, stable for surgery.  I have reviewed the patient's chart and labs.  Questions were answered to the patient's satisfaction.     Enid Harry

## 2023-06-20 NOTE — H&P (Signed)
  58 yof I saw recently after screening detected right breast calcs. She has c density breast tissue. She has an area measuring 6.5 cm from nipple laterally and posteriorly. The anterior and posterior extent have been biopsied. The posterior clip is displaced. 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  She has no prior history. She has no mass or dc. She has no FH She underwent a CEM after that that shows NME in an area that is 9.3x4.8x3.9 cm in size.   Medical History: Past Medical History:  Diagnosis Date  Diabetes mellitus without complication (CMS/HHS-HCC)  Hyperlipidemia   Patient Active Problem List  Diagnosis  Bell's palsy  Ductal carcinoma in situ (DCIS) of right breast  Environmental allergies  Esophageal reflux  Hyperlipidemia associated with type 2 diabetes mellitus (CMS/HHS-HCC)  Irritant contact dermatitis due to other chemical products  NAFL (nonalcoholic fatty liver)  Obesity (BMI 30-39.9), unspecified  Heartburn   Past Surgical History:  Procedure Laterality Date  Right breast biopsy Right 05/12/2023  Right breast biopsy Right 05/12/2023  .Tubal ligation    Current Outpatient Medications on File Prior to Visit  Medication Sig Dispense Refill  atorvastatin  (LIPITOR) 40 MG tablet Take 40 mg by mouth once daily  cetirizine  (ZYRTEC ) 10 MG tablet Take 10 mg by mouth once daily  dulaglutide  (TRULICITY ) 1.5 mg/0.5 mL subcutaneous pen injector Inject 1.5 mg subcutaneously once a week  meloxicam  (MOBIC ) 7.5 MG tablet Take 7.5 mg by mouth once daily  metFORMIN  (GLUCOPHAGE ) 1000 MG tablet Take 1,000 mg by mouth daily with breakfast  omeprazole  (PRILOSEC) 20 MG DR capsule Take 1 capsule by mouth once daily   Family History  Problem Relation Age of Onset  Diabetes Mother  Hyperlipidemia (Elevated cholesterol) Mother  High blood pressure (Hypertension) Mother  Deep vein thrombosis (DVT or abnormal blood clot formation) Father  Diabetes  Father  Obesity Sister    Social History   Tobacco Use  Smoking Status Never  Smokeless Tobacco Never   Marital status: Married  Tobacco Use  Smoking status: Never  Smokeless tobacco: Never  Vaping Use  Vaping status: Never Used  Substance and Sexual Activity  Alcohol use: Never  Drug use: Never   Objective:   Physical Exam   No change, no breast mass  Assessment and Plan:   DCIS, right sided  Right mastectomy  We discussed the contrast mammogram as well as the size of this. I reexamined her today as well. I think that unfortunately she is going need a mastectomy for this. This was discussed to her daughter. They did not want another translator. I discussed mastectomy as well as the risks and recovery with this. She has the pink card for her payment through the hospital system I am going to ask to see if this covers possible reconstruction. I will get back to her on this. If not we are going to proceed with mastectomy.

## 2023-06-20 NOTE — Anesthesia Postprocedure Evaluation (Signed)
 Anesthesia Post Note  Patient: Azra Hernandez-Fernandez  Procedure(s) Performed: MASTECTOMY, SIMPLE (Right: Breast)     Patient location during evaluation: PACU Anesthesia Type: General Level of consciousness: awake and alert Pain management: pain level controlled Vital Signs Assessment: post-procedure vital signs reviewed and stable Respiratory status: spontaneous breathing, nonlabored ventilation, respiratory function stable and patient connected to nasal cannula oxygen Cardiovascular status: blood pressure returned to baseline and stable Postop Assessment: no apparent nausea or vomiting Anesthetic complications: no  No notable events documented.  Last Vitals:  Vitals:   06/20/23 1715 06/20/23 1720  BP: 138/62 (!) 141/59  Pulse: 63 61  Resp: 14 16  Temp:    SpO2: 96% 96%    Last Pain:  Vitals:   06/20/23 1715  TempSrc:   PainSc: Asleep   Pain Goal:                   Melvenia Stabs

## 2023-06-20 NOTE — Transfer of Care (Signed)
 Immediate Anesthesia Transfer of Care Note  Patient: Stephany Hernandez-Fernandez  Procedure(s) Performed: MASTECTOMY, SIMPLE (Right: Breast)  Patient Location: PACU  Anesthesia Type:General  Level of Consciousness: awake  Airway & Oxygen Therapy: Patient Spontanous Breathing and Patient connected to nasal cannula oxygen  Post-op Assessment: Report given to RN and Post -op Vital signs reviewed and stable  Post vital signs: Reviewed and stable  Last Vitals:  Vitals Value Taken Time  BP 121/58 06/20/23 1649  Temp    Pulse 63 06/20/23 1653  Resp 15 06/20/23 1653  SpO2 100 % 06/20/23 1653  Vitals shown include unfiled device data.  Last Pain:  Vitals:   06/20/23 1355  TempSrc:   PainSc: 0-No pain         Complications: No notable events documented.

## 2023-06-20 NOTE — Op Note (Signed)
 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 Donavan Fuchs Anesthesia: General With a pectoral block Estimated blood loss: Less than 50 cc Specimens: Right breast tissue marked short superior, long lateral Drains: 19 French Blake drain in the mastectomy space Complications: None Sponge needle count was correct completion Disposition to recovery stable condition  Indications:63 yof I saw recently after screening detected right breast calcs. She has c density breast tissue. She has an area measuring 6.5 cm from nipple laterally and posteriorly. The anterior and posterior extent have been biopsied. The posterior clip is displaced. 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 She underwent a CEM after that that shows NME in an area that is 9.3x4.8x3.9 cm in size. We discussed mastectomy now. Unable to get reconstruction.  Procedure: After informed consent was obtained she was taken to the operating room.  She underwent a pectoral block.  She was placed under general anesthesia without complication.  She was given antibiotics.  SCDs were in place.  She was prepped and draped in the standard sterile surgical fashion.  Surgical timeout was then performed.  I made a large elliptical incision and cover the nipple areola.  I then created flaps of the parasternal area, clavicle, latissimus laterally.  The inferior incision was at the inframammary fold.  I then remove the breast tissue and the fascia from the muscle.  This was then passed off the table.  I did remove extra skin and fat laterally as well.  I then obtained hemostasis.  I placed a TXA soaked sponge in for 5 minutes.  I then placed a 60 [place] Blakedrain and secured this with a 2-0 nylon suture.  I then closed the deep layers with 3-0 Vicryl.  I removed some additional fatty tissue to try to get this to be as flat as possible.  The skin was then  closed with 4-0 Monocryl in glue.  She tolerated this well was transferred to recovery stable.  I did inject mag trace at the beginning just in case I need to do a sentinel node later.

## 2023-06-20 NOTE — Anesthesia Procedure Notes (Signed)
 Anesthesia Regional Block: Pectoralis block   Pre-Anesthetic Checklist: , timeout performed,  Correct Patient, Correct Site, Correct Laterality,  Correct Procedure, Correct Position, site marked,  Risks and benefits discussed,  Surgical consent,  Pre-op evaluation,  At surgeon's request and post-op pain management  Laterality: Right and Upper  Prep: chloraprep       Needles:  Injection technique: Single-shot  Needle Type: Echogenic Needle     Needle Length: 9cm  Needle Gauge: 21     Additional Needles:   Procedures:,,,, ultrasound used (permanent image in chart),,    Narrative:  Start time: 06/20/2023 1:35 PM End time: 06/20/2023 1:42 PM Injection made incrementally with aspirations every 5 mL.  Performed by: Personally  Anesthesiologist: Rosalita Combe, MD  Additional Notes: Block assessed. Patient tolerated procedure well.

## 2023-06-20 NOTE — Anesthesia Preprocedure Evaluation (Signed)
 Anesthesia Evaluation  Patient identified by MRN, date of birth, ID band Patient awake    Reviewed: Allergy & Precautions, NPO status , Patient's Chart, lab work & pertinent test results  Airway Mallampati: II  TM Distance: >3 FB Neck ROM: Full    Dental no notable dental hx. (+) Teeth Intact, Dental Advisory Given   Pulmonary neg pulmonary ROS   Pulmonary exam normal breath sounds clear to auscultation       Cardiovascular Exercise Tolerance: Good Normal cardiovascular exam Rhythm:Regular Rate:Normal     Neuro/Psych negative neurological ROS  negative psych ROS   GI/Hepatic ,GERD  ,,  Endo/Other  diabetes, Type 2    Renal/GU      Musculoskeletal   Abdominal   Peds  Hematology   Anesthesia Other Findings   Reproductive/Obstetrics                             Anesthesia Physical Anesthesia Plan  ASA: 2  Anesthesia Plan: General   Post-op Pain Management: Regional block*   Induction:   PONV Risk Score and Plan: TIVA, Propofol infusion, Treatment may vary due to age or medical condition, Midazolam and Ondansetron  Airway Management Planned: LMA  Additional Equipment: None  Intra-op Plan:   Post-operative Plan: Extubation in OR  Informed Consent: I have reviewed the patients History and Physical, chart, labs and discussed the procedure including the risks, benefits and alternatives for the proposed anesthesia with the patient or authorized representative who has indicated his/her understanding and acceptance.     Dental advisory given  Plan Discussed with: CRNA and Surgeon  Anesthesia Plan Comments: (R Pec Block LMA TIVA )       Anesthesia Quick Evaluation

## 2023-06-21 ENCOUNTER — Other Ambulatory Visit: Payer: Self-pay

## 2023-06-21 ENCOUNTER — Other Ambulatory Visit (HOSPITAL_COMMUNITY): Payer: Self-pay

## 2023-06-21 ENCOUNTER — Encounter (HOSPITAL_COMMUNITY): Payer: Self-pay | Admitting: General Surgery

## 2023-06-21 ENCOUNTER — Encounter: Payer: Self-pay | Admitting: Licensed Clinical Social Worker

## 2023-06-21 MED ORDER — OXYCODONE HCL 5 MG PO TABS
5.0000 mg | ORAL_TABLET | ORAL | 0 refills | Status: DC | PRN
  Filled 2023-06-21: qty 10, 2d supply, fill #0

## 2023-06-21 MED ORDER — METHOCARBAMOL 500 MG PO TABS
500.0000 mg | ORAL_TABLET | Freq: Three times a day (TID) | ORAL | 0 refills | Status: DC
  Filled 2023-06-21: qty 30, 10d supply, fill #0

## 2023-06-21 MED ORDER — ONDANSETRON 4 MG PO TBDP
4.0000 mg | ORAL_TABLET | Freq: Three times a day (TID) | ORAL | 0 refills | Status: AC | PRN
  Filled 2023-06-21: qty 20, 7d supply, fill #0

## 2023-06-21 NOTE — Plan of Care (Signed)

## 2023-06-21 NOTE — Progress Notes (Signed)
 [name]  Clinical Child psychotherapist sent message to pt's daughter in law, [place], to follow-up on resources and determine if pt/family would like to apply to [employer] or [employer].    [name], LCSW Clinical Social Worker [employer]

## 2023-06-21 NOTE — Progress Notes (Signed)
 Explained discharge instructions to patient. Reviewed follow up appointment and next medication administration times. Also reviewed education of JP drain and incisional care to both patient and family. Had patient to demonstrate how to discharge and charge the JP drains. Patient and family verbalized having an understanding for instructions given. All belongings are in the patient's possession. IV was removed by patient's RN.No other needs verbalized. Transported downstairs for discharge.

## 2023-06-21 NOTE — Progress Notes (Signed)
 Discharge instructions given to pt via interpretor. Pt verbalized understanding of all teaching and had no further question. JP taught how to empty JP drain and return it back to charge position. Pt verbalized ability to do it at home after discharge. Discharge instructions given in english and spanish.

## 2023-06-21 NOTE — Plan of Care (Signed)
   Problem: Education: Goal: Knowledge of General Education information will improve Description: Including pain rating scale, medication(s)/side effects and non-pharmacologic comfort measures Outcome: Progressing   Problem: Clinical Measurements: Goal: Ability to maintain clinical measurements within normal limits will improve Outcome: Progressing   Problem: Clinical Measurements: Goal: Respiratory complications will improve Outcome: Progressing

## 2023-06-21 NOTE — Progress Notes (Signed)
 Pt discharged to home with son with all belongings and paperwork

## 2023-06-22 ENCOUNTER — Telehealth: Payer: Self-pay

## 2023-06-22 LAB — SURGICAL PATHOLOGY

## 2023-06-22 NOTE — Transitions of Care (Post Inpatient/ED Visit) (Signed)
   06/22/2023  Name: [name] [name] MRN: [id number] DOB: 03-Dec-1960  Today's TOC FU Call Status: Today's TOC FU Call Status:: Successful TOC FU Call Completed TOC FU Call Complete Date: 06/22/23 Patient's Name and Date of Birth confirmed.  Transition Care Management Follow-up Telephone Call Date of Discharge: 06/21/23 Discharge Facility: [facility]) Type of Discharge: Inpatient Admission Primary Inpatient Discharge Diagnosis:: s/p right mastectomy How have you been since you were released from the hospital?: Better (She said she is feeling a little better. She is alternating oxycodone and tylenol  for pain control. she stated that's how it was explained to her to take the pain medications and she is having some relief of the pain. She also reports some dizziness) Any questions or concerns?: Yes Patient Questions/Concerns:: She stated that she has not moved her bowels since coming home despite increasing her fluid intake and eating fruit. I reviewed the AVS instructions for constipation.  They are in [place] and she said she has someone who can read English for her.  I explained about the Colace and Miralax and that the directions for these are on AVS - page 8 #6.  She said she understood.  I told her that those medications are OTC but if she is still not having relief of her constipation, she needs to call her surgeon and again she verbalized understanding Patient Questions/Concerns Addressed: Provided Patient Educational Materials  Items Reviewed: Did you receive and understand the discharge instructions provided?: Yes Medications obtained,verified, and reconciled?: Partial Review Completed Reason for Partial Mediation Review: She said she has all of her medications as well as a glucometer and she did not have any questions about the med regime and she did not need to review the med list. Any new allergies since your discharge?: No Dietary orders reviewed?: Yes Type of Diet  Ordered:: heart healthy, diabetic- discussed very briefly. Do you have support at home?: Yes People in Home [RPT]: spouse Name of Support/Comfort Primary Source: her husband  Medications Reviewed Today: Medications Reviewed Today   Medications were not reviewed in this encounter     Home Care and Equipment/Supplies: Were Home Health Services Ordered?: No Any new equipment or medical supplies ordered?: No  Functional Questionnaire: Do you need assistance with bathing/showering or dressing?: No (She said she has been emptying the JP drain and keeping a record of the output. She did not report any problems with the drain) Do you need assistance with meal preparation?: Yes (Her husband assists.) Do you need assistance with eating?: No Do you have difficulty maintaining continence: No Do you need assistance with getting out of bed/getting out of a chair/moving?: No Do you have difficulty managing or taking your medications?: No  Follow up appointments reviewed: PCP Follow-up appointment confirmed?: Yes Date of PCP follow-up appointment?: 07/24/23 Follow-up Provider: [facility] Follow-up appointment confirmed?: Yes Date of Specialist follow-up appointment?: 07/19/23 Follow-Up Specialty Provider:: Dr Delane Fear- surgeon;   07/10/2023- oncology. Do you need transportation to your follow-up appointment?: No Do you understand care options if your condition(s) worsen?: Yes-patient verbalized understanding    SIGNATURE Burnett Carson, RN

## 2023-06-23 ENCOUNTER — Other Ambulatory Visit: Payer: Self-pay

## 2023-06-23 ENCOUNTER — Other Ambulatory Visit (HOSPITAL_COMMUNITY): Payer: Self-pay

## 2023-06-23 MED ORDER — OXYCODONE HCL 5 MG PO TABS
5.0000 mg | ORAL_TABLET | ORAL | 0 refills | Status: DC | PRN
  Filled 2023-06-23: qty 10, 2d supply, fill #0

## 2023-06-26 ENCOUNTER — Other Ambulatory Visit: Payer: Self-pay

## 2023-06-26 NOTE — Discharge Summary (Signed)
 Physician Discharge Summary  Patient ID: [name] [name] MRN: [id number] DOB/AGE: 06-04-1960 63 y.o.  Admit date: 06/20/2023 Discharge date: 06/26/2023  Admission Diagnoses: Right breast DCIS  Discharge Diagnoses:  Principal Problem:   S/P mastectomy, right   Discharged Condition: good  Hospital Course: 63 yof with right breast DCIS, underwent right mastectomy and following morning she is tol diet, no hematoma, pain controlled. Ready for dc  Consults: None  Significant Diagnostic Studies: none  Treatments: surgery: right mastectomy    Disposition: Discharge disposition: 01-Home or Self Care        Allergies as of 06/21/2023       Reactions   Ibuprofen         Medication List     TAKE these medications    aspirin  325 MG tablet Take 1 tablet (325 mg total) by mouth daily.   atorvastatin  40 MG tablet Commonly known as: LIPITOR Tome 1 tableta (40 mg en total) por va oral diariamente. (Take 1 tablet (40 mg total) by mouth daily.)   cetirizine  10 MG tablet Commonly known as: ZYRTEC  Take 1 tablet (10 mg total) by mouth 2 (two) times daily. What changed:  when to take this reasons to take this   meloxicam  7.5 MG tablet Commonly known as: MOBIC  Tome 1 tableta (7.5 mg en total) por va oral diariamente. (Take 1 tablet (7.5 mg total) by mouth daily.)   metFORMIN  1000 MG tablet Commonly known as: GLUCOPHAGE  Take 1 tablet (1,000 mg total) by mouth daily with breakfast.   methocarbamol  500 MG tablet Commonly known as: ROBAXIN  Take 1 tablet (500 mg total) by mouth 3 (three) times daily.   multivitamin with minerals Tabs tablet Take 1 tablet by mouth daily.   omeprazole  20 MG capsule Commonly known as: PRILOSEC Tome 1 cpsula por va oral diariamente. (Take 1 capsule by mouth daily.) What changed:  how much to take when to take this reasons to take this   True Metrix Blood Glucose Test test strip Generic drug: glucose blood Use segn las  indicaciones. (Use as instructed)   True Metrix Meter w/Device Kit Use como se indica. (Use as directed)   Trulicity  1.5 MG/0.5ML Soaj Generic drug: Dulaglutide  INJECT 1.5 MG INTO THE SKIN ONCE A WEEK.        Follow-up Information     Enid Harry, MD Follow up in 2 week(s).   Specialty: General Surgery Contact information: [address] [place] [address] [phone]                 Signed: Enid Harry 06/26/2023, 9:40 AM

## 2023-06-27 ENCOUNTER — Other Ambulatory Visit: Payer: Self-pay

## 2023-06-27 ENCOUNTER — Encounter: Payer: Self-pay | Admitting: *Deleted

## 2023-07-05 ENCOUNTER — Other Ambulatory Visit: Payer: Self-pay

## 2023-07-09 NOTE — Assessment & Plan Note (Signed)
 05/12/2023:Screening mammogram detected right breast calcification extending from nipple towards the back coarse heterogeneous calcifications measuring 7 cm.  Anterior and posterior biopsies revealed low-grade intermediate grade DCIS ER 100%, PR 20%   06/20/23: Right Mastectomy: Grade 1 DCIS 1.8 cm, Margins Neg, ER PR Positive  Treatment Plan: Anti estrogen therapy with Tamoxifen X 5 years  Tamoxifen counseling: We discussed the risks and benefits of tamoxifen. These include but not limited to insomnia, hot flashes, mood changes, vaginal dryness, and weight gain. Although rare, serious side effects including endometrial cancer, risk of blood clots were also discussed. We strongly believe that the benefits far outweigh the risks. Patient understands these risks and consented to starting treatment. Planned treatment duration is 5 years.  RTC in 3 months for SCP visit

## 2023-07-10 ENCOUNTER — Other Ambulatory Visit: Payer: Self-pay

## 2023-07-10 ENCOUNTER — Inpatient Hospital Stay: Attending: Hematology and Oncology | Admitting: Hematology and Oncology

## 2023-07-10 VITALS — BP 141/59 | HR 83 | Temp 98.2°F | Resp 17 | Wt 179.6 lb

## 2023-07-10 DIAGNOSIS — D0511 Intraductal carcinoma in situ of right breast: Secondary | ICD-10-CM | POA: Insufficient documentation

## 2023-07-10 DIAGNOSIS — Z170 Estrogen receptor positive status [ER+]: Secondary | ICD-10-CM | POA: Insufficient documentation

## 2023-07-10 DIAGNOSIS — Z9011 Acquired absence of right breast and nipple: Secondary | ICD-10-CM | POA: Insufficient documentation

## 2023-07-10 MED ORDER — TAMOXIFEN CITRATE 10 MG PO TABS
10.0000 mg | ORAL_TABLET | Freq: Every day | ORAL | 3 refills | Status: DC
  Filled 2023-07-10: qty 90, 90d supply, fill #0

## 2023-07-10 NOTE — Progress Notes (Signed)
 Patient Care Team: Vicci Barnie NOVAK, MD as PCP - General (Internal Medicine) Brannock, Wanda SQUIBB, RN Harl Eleanor LABOR, NP as Nurse Practitioner (Hematology and Oncology) Tyree Nanetta SAILOR, RN as Oncology Nurse Navigator Glean, Stephane BROCKS, RN (Inactive) as Oncology Nurse Navigator Ebbie Cough, MD as Consulting Physician (General Surgery) Odean Potts, MD as Consulting Physician (Hematology and Oncology) Dewey Rush, MD as Consulting Physician (Radiation Oncology)  DIAGNOSIS:  Encounter Diagnosis  Name Primary?   Ductal carcinoma in situ (DCIS) of right breast Yes    SUMMARY OF ONCOLOGIC HISTORY: Oncology History  Ductal carcinoma in situ (DCIS) of right breast  05/12/2023 Initial Diagnosis   Screening mammogram detected right breast calcification extending from nipple towards the back coarse heterogeneous calcifications measuring 7 cm.  Anterior and posterior biopsies revealed low-grade intermediate grade DCIS ER 100%, PR 20%   05/24/2023 Cancer Staging   Staging form: Breast, AJCC 8th Edition - Clinical: Stage 0 (cTis (DCIS), cN0, cM0, ER+, PR+, HER2: Not Assessed) - Signed by Odean Potts, MD on 05/24/2023 Stage prefix: Initial diagnosis Nuclear grade: G2     CHIEF COMPLIANT: F/U after breast surgery  HISTORY OF PRESENT ILLNESS: Patient underwent mastectomy and is here to discuss the path report She's healing well from surgery   ALLERGIES:  is allergic to ibuprofen.  MEDICATIONS:  Current Outpatient Medications  Medication Sig Dispense Refill   [START ON 07/24/2023] tamoxifen (NOLVADEX) 10 MG tablet Take 1 tablet (10 mg total) by mouth daily. 90 tablet 3   aspirin  325 MG tablet Take 1 tablet (325 mg total) by mouth daily. (Patient not taking: Reported on 06/07/2023) 30 tablet 0   atorvastatin  (LIPITOR) 40 MG tablet Take 1 tablet (40 mg total) by mouth daily. 90 tablet 1   Blood Glucose Monitoring Suppl (TRUE METRIX METER) w/Device KIT Use as directed 1 kit 0    cetirizine  (ZYRTEC ) 10 MG tablet Take 1 tablet (10 mg total) by mouth 2 (two) times daily. (Patient taking differently: Take 10 mg by mouth daily as needed for allergies.) 60 tablet 5   Dulaglutide  (TRULICITY ) 1.5 MG/0.5ML SOAJ INJECT 1.5 MG INTO THE SKIN ONCE A WEEK. 2 mL 6   glucose blood (TRUE METRIX BLOOD GLUCOSE TEST) test strip Use as instructed 100 each 3   meloxicam  (MOBIC ) 7.5 MG tablet Take 1 tablet (7.5 mg total) by mouth daily. 30 tablet 3   metFORMIN  (GLUCOPHAGE ) 1000 MG tablet Take 1 tablet (1,000 mg total) by mouth daily with breakfast. 90 tablet 3   methocarbamol  (ROBAXIN ) 500 MG tablet Take 1 tablet (500 mg total) by mouth 3 (three) times daily. 30 tablet 0   Multiple Vitamin (MULTIVITAMIN WITH MINERALS) TABS tablet Take 1 tablet by mouth daily.     omeprazole  (PRILOSEC) 20 MG capsule Take 1 capsule by mouth daily. (Patient taking differently: Take 20 mg by mouth daily as needed (ACID REFLUX).) 90 capsule 0   ondansetron  (ZOFRAN -ODT) 4 MG disintegrating tablet Dissolve 1 tablet (4 mg total) in mouth every 8 (eight) hours as needed for nausea for up to 7 days 20 tablet 0   oxyCODONE  (OXY IR/ROXICODONE ) 5 MG immediate release tablet Take 1 tablet (5 mg total) by mouth every 4 (four) hours as needed for pain. 10 tablet 0   No current facility-administered medications for this visit.    PHYSICAL EXAMINATION: ECOG PERFORMANCE STATUS: 1 - Symptomatic but completely ambulatory  Vitals:   07/10/23 1423  BP: (!) 141/59  Pulse: 83  Resp: 17  Temp:  98.2 F (36.8 C)  SpO2: 97%   Filed Weights   07/10/23 1423  Weight: 179 lb 9.6 oz (81.5 kg)    Physical Exam   (exam performed in the presence of a chaperone)  LABORATORY DATA:  I have reviewed the data as listed    Latest Ref Rng & Units 05/24/2023   12:15 PM 03/23/2023   10:07 AM 03/01/2022   11:27 AM  CMP  Glucose 70 - 99 mg/dL 843  864  891   BUN 8 - 23 mg/dL 13  10  8    Creatinine 0.44 - 1.00 mg/dL 9.29  9.40  9.40    Sodium 135 - 145 mmol/L 140  142  141   Potassium 3.5 - 5.1 mmol/L 4.0  4.2  4.2   Chloride 98 - 111 mmol/L 102  104  102   CO2 22 - 32 mmol/L 31  23  23    Calcium  8.9 - 10.3 mg/dL 9.9  9.7  9.5   Total Protein 6.5 - 8.1 g/dL 7.7  7.1  7.0   Total Bilirubin 0.0 - 1.2 mg/dL 1.1  1.0  0.9   Alkaline Phos 38 - 126 U/L 105  118  102   AST 15 - 41 U/L 26  22  19    ALT 0 - 44 U/L 32  21  20     Lab Results  Component Value Date   WBC 6.0 05/24/2023   HGB 13.5 05/24/2023   HCT 39.7 05/24/2023   MCV 86.1 05/24/2023   PLT 268 05/24/2023   NEUTROABS 3.9 05/24/2023    ASSESSMENT & PLAN:  Ductal carcinoma in situ (DCIS) of right breast 05/12/2023:Screening mammogram detected right breast calcification extending from nipple towards the back coarse heterogeneous calcifications measuring 7 cm.  Anterior and posterior biopsies revealed low-grade intermediate grade DCIS ER 100%, PR 20%   06/20/23: Right Mastectomy: Grade 1 DCIS 1.8 cm, Margins Neg, ER PR Positive  Treatment Plan: Anti estrogen therapy with Tamoxifen X 5 years  Tamoxifen counseling: We discussed the risks and benefits of tamoxifen. These include but not limited to insomnia, hot flashes, mood changes, vaginal dryness, and weight gain. Although rare, serious side effects including endometrial cancer, risk of blood clots were also discussed. We strongly believe that the benefits far outweigh the risks. Patient understands these risks and consented to starting treatment. Planned treatment duration is 5 years.  RTC in 3 months for SCP visit   No orders of the defined types were placed in this encounter.  The patient has a good understanding of the overall plan. she agrees with it. she will call with any problems that may develop before the next visit here. Total time spent: 30 mins including face to face time and time spent for planning, charting and co-ordination of care   Viinay K Shaiden Aldous, MD 07/10/23

## 2023-07-11 ENCOUNTER — Other Ambulatory Visit: Payer: Self-pay

## 2023-07-14 ENCOUNTER — Encounter: Payer: Self-pay | Admitting: *Deleted

## 2023-07-14 DIAGNOSIS — D0511 Intraductal carcinoma in situ of right breast: Secondary | ICD-10-CM

## 2023-07-17 ENCOUNTER — Other Ambulatory Visit: Payer: Self-pay

## 2023-07-20 ENCOUNTER — Other Ambulatory Visit: Payer: Self-pay

## 2023-07-20 ENCOUNTER — Ambulatory Visit: Payer: Self-pay | Attending: General Surgery | Admitting: Rehabilitation

## 2023-07-20 ENCOUNTER — Encounter: Payer: Self-pay | Admitting: Rehabilitation

## 2023-07-20 DIAGNOSIS — M25611 Stiffness of right shoulder, not elsewhere classified: Secondary | ICD-10-CM | POA: Insufficient documentation

## 2023-07-20 DIAGNOSIS — D0511 Intraductal carcinoma in situ of right breast: Secondary | ICD-10-CM | POA: Insufficient documentation

## 2023-07-20 DIAGNOSIS — Z483 Aftercare following surgery for neoplasm: Secondary | ICD-10-CM | POA: Insufficient documentation

## 2023-07-20 NOTE — Therapy (Signed)
 OUTPATIENT PHYSICAL THERAPY  UPPER EXTREMITY ONCOLOGY EVALUATION  Patient Name: [name] [name] MRN: [id number] DOB:09-01-1960, 63 y.o., female Today's Date: 07/20/2023  END OF SESSION:  PT End of Session - 07/20/23 1256     Visit Number 1    Number of Visits 2    Date for PT Re-Evaluation 08/17/23    PT Start Time 1206    PT Stop Time 1256    PT Time Calculation (min) 50 min    Activity Tolerance Patient tolerated treatment well    Behavior During Therapy [facility] for tasks assessed/performed          Past Medical History:  Diagnosis Date   Bell's palsy    Diabetes mellitus without complication (HCC)    Hyperlipidemia    Medical history non-contributory    Past Surgical History:  Procedure Laterality Date   BREAST BIOPSY Right 05/12/2023   MM RT BREAST BX W LOC DEV EA AD LESION IMG BX SPEC STEREO GUIDE 05/12/2023 [facility]   BREAST BIOPSY Right 05/12/2023   MM RT BREAST BX W LOC DEV 1ST LESION IMAGE BX SPEC STEREO GUIDE 05/12/2023 [facility]   TOTAL MASTECTOMY Right 06/20/2023   Procedure: MASTECTOMY, SIMPLE;  Surgeon: Ebbie Cough, MD;  Location: [facility] OR;  Service: General;  Laterality: Right;  RIGHT MASTECTOMY GEN w/PEC BLOCK   TUBAL LIGATION  1995   Patient Active Problem List   Diagnosis Date Noted   S/P mastectomy, right 06/20/2023   Ductal carcinoma in situ (DCIS) of right breast 05/22/2023   Hyperlipidemia associated with type 2 diabetes mellitus (HCC) 06/06/2019   Screening breast examination 09/27/2018   Obesity (BMI 30-39.9) 09/29/2016   NAFL (nonalcoholic fatty liver) 06/16/2016   Irritant contact dermatitis due to other chemical products 05/20/2016   Uncontrolled type 2 diabetes mellitus 10/21/2014   Hyperlipidemia 11/04/2013   Environmental allergies 07/12/2013   Bell's palsy 11/06/2012   GERD 11/04/2005    PCP: Barnie Louder, MD  REFERRING PROVIDER: Cough Ebbie, MD  REFERRING DIAG: Rt breast cancer post mastectomy    THERAPY DIAG:  Ductal carcinoma in situ (DCIS) of right breast  Aftercare following surgery for neoplasm  Shoulder stiffness, right  ONSET DATE: 06/20/23  Rationale for Evaluation and Treatment: Rehabilitation  SUBJECTIVE:                                                                                                                                                                                           SUBJECTIVE STATEMENT: I just feel weird.  I am wearing a tank top. I am feeling numb.    PERTINENT HISTORY: Rt mastectomy 06/20/23 due to  DCIS. No radiation needed.   PAIN:  Are you having pain? Yes, only when I touch.   NPRS scale: 3/10 Pain location: Rt chest and under the arm  Pain orientation: Right  PAIN TYPE: burning and sharp Pain description: intermittent  Aggravating factors: raise my arm and touch it  Relieving factors: nothing    PRECAUTIONS: None  RED FLAGS: None   WEIGHT BEARING RESTRICTIONS: No  FALLS:  Has patient fallen in last 6 months? No  LIVING ENVIRONMENT: Lives with: husband, child and her family - lisette   OCCUPATION: not working x 2 more weeks.  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  Works 9 hours per day. I can go back part time.    HAND DOMINANCE: right   PRIOR LEVEL OF FUNCTION: Independent  PATIENT GOALS: get the motion back in the arm    OBJECTIVE: Note: Objective measures were completed at Evaluation unless otherwise noted.  COGNITION: Overall cognitive status: Within functional limits for tasks assessed   PALPATION: No edema noted  OBSERVATIONS / OTHER ASSESSMENTS: incision is well healed with some glue and scabbing lateral edge.  Long incision overall.   POSTURE: a bit guarded   UPPER EXTREMITY AROM/PROM:  A/PROM RIGHT   eval   Shoulder extension 30  Shoulder flexion 125 - tight  Shoulder abduction 145 - tight  Shoulder internal rotation 30  Shoulder external rotation 80    (Blank rows =  not tested)  A/PROM LEFT   eval  Shoulder extension 45  Shoulder flexion 150  Shoulder abduction 145  Shoulder internal rotation 45  Shoulder external rotation 92    (Blank rows = not tested)   QUICK DASH SURVEY: 45%                                                                                                                            TREATMENT DATE:  07/20/23 Eval performed Education on post op exercises to start switching to flexion AAROM and chest stretch to supine with performance of each with cueing.  Pt with hesitation about incision opening so PT performed PROM in all directions watching incision and there was no opening or no fragile locations noted.  Pt feeling better about movement.  Pulleys into flexion and abduction showing then what to order  Discussed scar massage and bras Discussed POC with daughter and pt.  Pt will try stretches at home x 1 week and return for re-check and call if it is not needed.    PATIENT EDUCATION:  Education details: per today's note Person educated: Patient and Child(ren) Education method: Explanation, Demonstration, Tactile cues, Verbal cues, and Handouts Education comprehension: verbalized understanding and returned demonstration  HOME EXERCISE PROGRAM: Post op, supine chest stretch and flexion version  ASSESSMENT:  CLINICAL IMPRESSION: Patient is a 63 y.o. female who was seen today for physical therapy evaluation and treatment for her fear of movement and need for  post op movement instruction post mastectomy leading to decreased functional mobility.  She needs to return to work in 2 weeks at [employer].  Overall only her flexion is limited and she should regain this motion easily one starting her HEP.  She felt better about movement after examining her incision during PROM and stretches to verify no part of the incision would open. She will perform HEP at home x 1 week and then check in.    OBJECTIVE IMPAIRMENTS:  decreased knowledge of condition, decreased ROM, increased fascial restrictions, and pain.   ACTIVITY LIMITATIONS: carrying and lifting  PARTICIPATION LIMITATIONS: meal prep, community activity, and occupation  PERSONAL FACTORS: none are also affecting patient's functional outcome.   REHAB POTENTIAL: Excellent  CLINICAL DECISION MAKING: Stable/uncomplicated  EVALUATION COMPLEXITY: Low  GOALS: Goals reviewed with patient? No  SHORT TERM GOALS: Target date: 07/20/23  Pt will be educated on stretches to start for improved mobility  Baseline: Goal status: MET  2.  Pt will be educated on scar massage and care Baseline:  Goal status: MET   LONG TERM GOALS: Target date: 08/17/23  Pt will improve Rt shoulder AROM to at least the left  Baseline:  Goal status: INITIAL   PLAN:  PT FREQUENCY: 1x/week  PT DURATION: 4 weeks  PLANNED INTERVENTIONS: 97164- PT Re-evaluation, 97110-Therapeutic exercises, 97530- Therapeutic activity, 97535- Self Care, [id number]- Manual therapy, Patient/Family education, Balance training, Joint mobilization, Therapeutic exercises, Therapeutic activity, Neuromuscular re-education, Gait training, and Self Care  PLAN FOR NEXT SESSION: recheck AROM  Larue Saddie SAUNDERS, PT 07/20/2023, 12:57 PM

## 2023-07-24 ENCOUNTER — Other Ambulatory Visit: Payer: Self-pay

## 2023-07-24 ENCOUNTER — Encounter: Payer: Self-pay | Admitting: Internal Medicine

## 2023-07-24 ENCOUNTER — Ambulatory Visit: Payer: Self-pay | Attending: Internal Medicine | Admitting: Internal Medicine

## 2023-07-24 VITALS — BP 128/52 | HR 86 | Temp 98.6°F | Ht 61.0 in | Wt 180.0 lb

## 2023-07-24 DIAGNOSIS — E669 Obesity, unspecified: Secondary | ICD-10-CM

## 2023-07-24 DIAGNOSIS — E119 Type 2 diabetes mellitus without complications: Secondary | ICD-10-CM

## 2023-07-24 DIAGNOSIS — Z6834 Body mass index (BMI) 34.0-34.9, adult: Secondary | ICD-10-CM

## 2023-07-24 DIAGNOSIS — E785 Hyperlipidemia, unspecified: Secondary | ICD-10-CM

## 2023-07-24 DIAGNOSIS — E1169 Type 2 diabetes mellitus with other specified complication: Secondary | ICD-10-CM

## 2023-07-24 DIAGNOSIS — G8929 Other chronic pain: Secondary | ICD-10-CM

## 2023-07-24 DIAGNOSIS — D0511 Intraductal carcinoma in situ of right breast: Secondary | ICD-10-CM

## 2023-07-24 DIAGNOSIS — Z7984 Long term (current) use of oral hypoglycemic drugs: Secondary | ICD-10-CM

## 2023-07-24 DIAGNOSIS — K760 Fatty (change of) liver, not elsewhere classified: Secondary | ICD-10-CM

## 2023-07-24 DIAGNOSIS — M25532 Pain in left wrist: Secondary | ICD-10-CM

## 2023-07-24 LAB — GLUCOSE, POCT (MANUAL RESULT ENTRY): POC Glucose: 202 mg/dL — AB (ref 70–99)

## 2023-07-24 MED ORDER — METFORMIN HCL 1000 MG PO TABS
1000.0000 mg | ORAL_TABLET | Freq: Two times a day (BID) | ORAL | 3 refills | Status: DC
  Filled 2023-07-24: qty 180, 90d supply, fill #0

## 2023-07-24 MED ORDER — DICLOFENAC SODIUM 1 % EX GEL
2.0000 g | Freq: Four times a day (QID) | CUTANEOUS | 1 refills | Status: AC
  Filled 2023-07-24: qty 100, 12d supply, fill #0
  Filled 2023-11-24: qty 100, 12d supply, fill #1

## 2023-07-24 NOTE — Progress Notes (Signed)
 Patient ID: [name] [name], female    DOB: 1960/08/19  MRN: [id number]  CC: Follow-up   Subjective: [name] [name] is a 63 y.o. female who presents for chronic ds management. Her concerns today include:  Pt with hx of DM type 2, obesity, NAFLD, HL, GERD, DCIS RT s/p mastectomy  AMN Language interpreter used during this encounter. #Juan [id number]   Discussed the use of AI scribe software for clinical note transcription with the patient, who gave verbal consent to proceed.  History of Present Illness [name] [name] is a 63 year old female with breast cancer status post mastectomy who presents for chronic disease management .  Since last visit with me, she was diagnosed with DCIS right breast.  Underwent mastectomy and is now on tamoxifen  with plan to continue for 5 years.  Followed by Dr. Gudena.  The surgical site from the mastectomy is healing with no drainage, but there is a small bump at the site. She is not performing dressing changes, and her recovery is being monitored by the surgeon.  HL: She continues atorvastatin  40 mg daily for hyperlipidemia, with her last LDL cholesterol level at 68 mg/dL in March, within the target range.  DM/MASLD/obesity: Results for orders placed or performed in visit on 07/24/23  POCT glucose (manual entry)   Collection Time: 07/24/23  9:12 AM  Result Value Ref Range   POC Glucose 202 (A) 70 - 99 mg/dl   Lab Results  Component Value Date   HGBA1C 5.8 (H) 06/07/2023  Diabetes management includes metformin  1000 mg once daily, though she missed doses for the past two days due to running out of medication. Her most recent A1c in May was 5.8%. She monitors her blood sugar once daily, with readings from 115 to 130 mg/dL. She has discontinued Trulicity  2 wks ago.  She finds it too uncomfortable injecting herself.  She would like to see if she is able to maintain good control of her diabetes with the metformin , healthy eating  habits and regular exercise.   -walks daily for 25-30 minutes.  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  Pain is primarily in the wrist and radiating to the thumb and index finger, worsened by lifting and thumb movement. She uses a wrist splint and takes arthritis medication which provides only temporary relief. - no numbness or tingling.    Patient Active Problem List   Diagnosis Date Noted   S/P mastectomy, right 06/20/2023   Ductal carcinoma in situ (DCIS) of right breast 05/22/2023   Hyperlipidemia associated with type 2 diabetes mellitus (HCC) 06/06/2019   Screening breast examination 09/27/2018   Obesity (BMI 30-39.9) 09/29/2016   NAFL (nonalcoholic fatty liver) 06/16/2016   Irritant contact dermatitis due to other chemical products 05/20/2016   Uncontrolled type 2 diabetes mellitus 10/21/2014   Hyperlipidemia 11/04/2013   Environmental allergies 07/12/2013   Bell's palsy 11/06/2012   GERD 11/04/2005     Current Outpatient Medications on File Prior to Visit  Medication Sig Dispense Refill   aspirin  325 MG tablet Take 1 tablet (325 mg total) by mouth daily. (Patient not taking: Reported on 06/07/2023) 30 tablet 0   atorvastatin  (LIPITOR) 40 MG tablet Take 1 tablet (40 mg total) by mouth daily. 90 tablet 1   Blood Glucose Monitoring Suppl (TRUE METRIX METER) w/Device KIT Use as directed 1 kit 0   cetirizine  (ZYRTEC ) 10 MG tablet Take  1 tablet (10 mg total) by mouth 2 (two) times daily. (Patient taking differently: Take 10 mg by mouth daily as needed for allergies.) 60 tablet 5   Dulaglutide  (TRULICITY ) 1.5 MG/0.5ML SOAJ INJECT 1.5 MG INTO THE SKIN ONCE A WEEK. 2 mL 6   glucose blood (TRUE METRIX BLOOD GLUCOSE TEST) test strip Use as instructed 100 each 3   meloxicam  (MOBIC ) 7.5 MG tablet Take 1 tablet (7.5 mg total) by mouth daily. 30 tablet 3   metFORMIN  (GLUCOPHAGE ) 1000 MG tablet Take 1  tablet (1,000 mg total) by mouth daily with breakfast. 90 tablet 3   methocarbamol  (ROBAXIN ) 500 MG tablet Take 1 tablet (500 mg total) by mouth 3 (three) times daily. 30 tablet 0   Multiple Vitamin (MULTIVITAMIN WITH MINERALS) TABS tablet Take 1 tablet by mouth daily.     omeprazole  (PRILOSEC) 20 MG capsule Take 1 capsule by mouth daily. (Patient taking differently: Take 20 mg by mouth daily as needed (ACID REFLUX).) 90 capsule 0   ondansetron  (ZOFRAN -ODT) 4 MG disintegrating tablet Dissolve 1 tablet (4 mg total) in mouth every 8 (eight) hours as needed for nausea for up to 7 days 20 tablet 0   oxyCODONE  (OXY IR/ROXICODONE ) 5 MG immediate release tablet Take 1 tablet (5 mg total) by mouth every 4 (four) hours as needed for pain. 10 tablet 0   tamoxifen  (NOLVADEX ) 10 MG tablet Take 1 tablet (10 mg total) by mouth daily. 90 tablet 3   [DISCONTINUED] sitaGLIPtin  (JANUVIA ) 100 MG tablet Take 1 tablet (100 mg total) by mouth daily. 30 tablet 6   No current facility-administered medications on file prior to visit.    Allergies  Allergen Reactions   Ibuprofen     Social History   Socioeconomic History   Marital status: Married    Spouse name: Not on file   Number of children: 3   Years of education: Not on file   Highest education level: 9th grade  Occupational History   Not on file  Tobacco Use   Smoking status: Never   Smokeless tobacco: Never  Vaping Use   Vaping status: Never Used  Substance and Sexual Activity   Alcohol use: No   Drug use: No   Sexual activity: Yes    Birth control/protection: None, Surgical, Post-menopausal  Other Topics Concern   Not on file  Social History Narrative   Not on file   Social Drivers of Health   Financial Resource Strain: Low Risk  (03/23/2023)   Overall Financial Resource Strain (CARDIA)    Difficulty of Paying Living Expenses: Not very hard  Food Insecurity: No Food Insecurity (06/20/2023)   Hunger Vital Sign    Worried About Running Out  of Food in the Last Year: Never true    Ran Out of Food in the Last Year: Never true  Transportation Needs: No Transportation Needs (06/20/2023)   PRAPARE - Administrator, Civil Service (Medical): No    Lack of Transportation (Non-Medical): No  Physical Activity: Inactive (03/23/2023)   Exercise Vital Sign    Days of Exercise per Week: 0 days    Minutes of Exercise per Session: 0 min  Stress: No Stress Concern Present (03/23/2023)   [employer] of Occupational Health - Occupational Stress Questionnaire    Feeling of Stress : Not at all  Social Connections: Socially Integrated (06/20/2023)   Social Connection and Isolation Panel    Frequency of Communication with Friends and Family: Three times a week  Frequency of Social Gatherings with Friends and Family: Three times a week    Attends Religious Services: More than 4 times per year    Active Member of Clubs or Organizations: Yes    Attends Banker Meetings: More than 4 times per year    Marital Status: Married  Catering manager Violence: Not At Risk (06/20/2023)   Humiliation, Afraid, Rape, and Kick questionnaire    Fear of Current or Ex-Partner: No    Emotionally Abused: No    Physically Abused: No    Sexually Abused: No    Family History  Problem Relation Age of Onset   Hypertension Mother    Diabetes Mother    Diabetes Father    Diabetes Sister    Diabetes Sister    Diabetes Sister    Diabetes Sister    Diabetes Brother    Colon cancer Neg Hx    Breast cancer Neg Hx     Past Surgical History:  Procedure Laterality Date   BREAST BIOPSY Right 05/12/2023   MM RT BREAST BX W LOC DEV EA AD LESION IMG BX SPEC STEREO GUIDE 05/12/2023 [facility]   BREAST BIOPSY Right 05/12/2023   MM RT BREAST BX W LOC DEV 1ST LESION IMAGE BX SPEC STEREO GUIDE 05/12/2023 [facility]   TOTAL MASTECTOMY Right 06/20/2023   Procedure: MASTECTOMY, SIMPLE;  Surgeon: Ebbie Cough, MD;  Location: [facility] OR;   Service: General;  Laterality: Right;  RIGHT MASTECTOMY GEN w/PEC BLOCK   TUBAL LIGATION  1995    ROS: Review of Systems Negative except as stated above  PHYSICAL EXAM: BP (!) 128/52   Pulse 86   Temp 98.6 F (37 C) (Oral)   Ht 5' 1 (1.549 m)   Wt 180 lb (81.6 kg)   SpO2 95%   BMI 34.01 kg/m   Wt Readings from Last 3 Encounters:  07/24/23 180 lb (81.6 kg)  07/10/23 179 lb 9.6 oz (81.5 kg)  06/20/23 181 lb (82.1 kg)    Physical Exam  General appearance - alert, well appearing, and in no distress Mental status - normal mood, behavior, speech, dress, motor activity, and thought processes Neck - supple, no significant adenopathy Chest - clear to auscultation, no wheezes, rales or rhonchi, symmetric air entry Heart - normal rate, regular rhythm, normal S1, S2, no murmurs, rubs, clicks or gallops Extremities - peripheral pulses normal, no pedal edema, no clubbing or cyanosis MSK: LT wrist: no edema or erythema.  Good ROM.  Grip is normal Examination of the right anterior chest wall shows surgical wound that appears to be healing nicely.  No signs of wound dehiscence  Fibrosis 4 Score = 1.06 (Low risk)        Interpretation for patients with NAFLD          <1.30       -  F0-F1 (Low risk)          1.30-2.67 -  Indeterminate           >2.67      -  F3-F4 (High risk)     Validated for ages 36-65           Latest Ref Rng & Units 05/24/2023   12:15 PM 03/23/2023   10:07 AM 03/01/2022   11:27 AM  CMP  Glucose 70 - 99 mg/dL 843  864  891   BUN 8 - 23 mg/dL 13  10  8    Creatinine 0.44 - 1.00 mg/dL 9.29  0.59  0.59   Sodium 135 - 145 mmol/L 140  142  141   Potassium 3.5 - 5.1 mmol/L 4.0  4.2  4.2   Chloride 98 - 111 mmol/L 102  104  102   CO2 22 - 32 mmol/L 31  23  23    Calcium  8.9 - 10.3 mg/dL 9.9  9.7  9.5   Total Protein 6.5 - 8.1 g/dL 7.7  7.1  7.0   Total Bilirubin 0.0 - 1.2 mg/dL 1.1  1.0  0.9   Alkaline Phos 38 - 126 U/L 105  118  102   AST 15 - 41 U/L 26  22  19     ALT 0 - 44 U/L 32  21  20    Lipid Panel     Component Value Date/Time   CHOL 138 03/23/2023 1007   TRIG 126 03/23/2023 1007   HDL 48 03/23/2023 1007   CHOLHDL 2.9 03/23/2023 1007   CHOLHDL 4.0 06/04/2014 1055   VLDL 40 06/04/2014 1055   LDLCALC 68 03/23/2023 1007    CBC    Component Value Date/Time   WBC 6.0 05/24/2023 1215   WBC 6.2 07/12/2013 1253   RBC 4.61 05/24/2023 1215   HGB 13.5 05/24/2023 1215   HGB 13.6 03/23/2023 1007   HCT 39.7 05/24/2023 1215   HCT 41.3 03/23/2023 1007   PLT 268 05/24/2023 1215   PLT 258 03/23/2023 1007   MCV 86.1 05/24/2023 1215   MCV 90 03/23/2023 1007   MCH 29.3 05/24/2023 1215   MCHC 34.0 05/24/2023 1215   RDW 13.1 05/24/2023 1215   RDW 13.0 03/23/2023 1007   LYMPHSABS 1.5 05/24/2023 1215   MONOABS 0.4 05/24/2023 1215   EOSABS 0.2 05/24/2023 1215   BASOSABS 0.0 05/24/2023 1215    ASSESSMENT AND PLAN: 1. Type 2 diabetes mellitus with obesity (HCC) (Primary) At goal.  And patient wanting to discontinue Trulicity .  Advised that in doing so she will probably have rebound of weight gain and increased appetite.  Patient still feels that she would be able to maintain without it.  We will stop the Trulicity .  Continue metformin  but increase the dose to 1 mg twice a day.  Encouraged to continue healthy eating habits and regular exercise. - POCT glucose (manual entry) - metFORMIN  (GLUCOPHAGE ) 1000 MG tablet; Take 1 tablet (1,000 mg total) by mouth 2 (two) times daily with a meal.  Dispense: 180 tablet; Refill: 3  2. Diabetes mellitus treated with oral medication (HCC) See #1 above.  3. Metabolic dysfunction-associated steatotic liver disease (MASLD) See #1 above.  4. Hyperlipidemia associated with type 2 diabetes mellitus (HCC) Continue atorvastatin  40 mg daily.   5. Ductal carcinoma in situ (DCIS) of right breast Status postmastectomy.  Surgical site appears to be healing without any wound breakdown at this time.  Plan is for  tamoxifen  x 5 years.  6. Wrist pain, chronic, left I suspect she may have a component of De quervain tenosynovitis - AMB referral to orthopedics - diclofenac  Sodium (VOLTAREN ) 1 % GEL; Apply 2 g topically 4 (four) times daily.  Dispense: 100 g; Refill: 1   Patient was given the opportunity to ask questions.  Patient verbalized understanding of the plan and was able to repeat key elements of the plan.   This documentation was completed using Paediatric nurse.  Any transcriptional errors are unintentional.  Orders Placed This Encounter  Procedures   POCT glucose (manual entry)     Requested Prescriptions  No prescriptions requested or ordered in this encounter    No follow-ups on file.  Barnie Louder, MD, FACP

## 2023-07-24 NOTE — Patient Instructions (Signed)
 Alimentacin saludable en los [employer], Adult Una alimentacin saludable puede ayudarlo a Barista y Pharmacologist un peso saludable, reducir el riesgo de tener enfermedades crnicas y vivir [name] vida larga y productiva. Es importante que siga una modalidad de alimentacin saludable. Sus necesidades nutricionales y calricas deben satisfacerse principalmente con distintos alimentos ricos en nutrientes. Consejos para seguir Surveyor, minerals Lea las etiquetas de los alimentos Lea las etiquetas y elija las que digan lo siguiente: Productos reducidos en sodio o con bajo contenido de [place]. Jugos con 100 % jugo de fruta. Alimentos con bajo contenido de grasas saturadas (menos de 3 g por porcin) y alto contenido de grasas poliinsaturadas y Mining engineer. Alimentos con cereales integrales, como trigo integral, trigo partido, arroz integral y arroz salvaje. Cereales integrales fortificados con cido flico. Esto se recomienda a las mujeres embarazadas o que desean quedar embarazadas. Lea las etiquetas y no coma ni beba lo siguiente: Alimentos o bebidas con azcar agregada. Estos incluyen los alimentos que contienen azcar moreno, endulzante a base de maz, jarabe de maz, dextrosa, fructosa, glucosa, jarabe de maz de alta fructosa, miel, azcar invertido, lactosa, jarabe de [place], maltosa, Sunflower, azcar sin refinar, sacarosa, trehalosa y azcar turbinado. Limite el consumo de azcar agregada a menos del 10 % del total de caloras diarias. No consuma ms que las siguientes cantidades de azcar agregada por da: 6 cucharaditas (25 g) para las mujeres. 9 cucharaditas (38 g) para los hombres. Los alimentos que contienen almidones y cereales refinados o procesados. Los productos de cereales refinados, como harina blanca, harina de maz desgerminada, pan blanco y arroz blanco. Al ir de compras Elija refrigerios ricos en nutrientes, como verduras, frutas enteras y frutos secos. Evite los refrigerios con  alto contenido de caloras y International aid/development worker, como las papas fritas, los refrigerios frutales y los caramelos. Use alios y productos para untar a base de aceite con los Publishing rights manager de grasas slidas como la [place], la [place], la crema agria o el queso crema. Limite las salsas, las mezclas y los productos "instantneos" preelaborados como el arroz saborizado, los fideos instantneos y las pastas listas para comer. Pruebe ms fuentes de protena vegetal, como tofu, tempeh, frijoles negros, edamame, lentejas, frutos secos y semillas. Explore planes de alimentacin como la dieta mediterrnea o la dieta vegetariana. Pruebe salsas cardiosaludables hechas con frijoles y grasas saludables, como hummus y [place]. Las verduras van muy bien con ellas. Al cocinar Use aceite para Designer, multimedia de grasas slidas como [place], margarina o [place] de [place]. En lugar de frer, trate de cocinar en el horno, en la plancha o en la parrilla, o hervir los alimentos. Retire la parte grasa de las carnes antes de cocinarlas. Cocine las verduras al vapor en agua o caldo. Planificacin de las comidas  En las comidas, imagine dividir su plato en cuartos: La mitad del plato tiene frutas y verduras. Un cuarto del plato tiene cereales integrales. Un cuarto del plato tiene protena, especialmente carnes [place], aves, huevos, tofu, frijoles o frutos secos. Incluya lcteos descremados en su dieta diaria. Estilo de vida Elija opciones saludables en todos los mbitos, como en el hogar, el [place], la [place], los restaurantes y Vina. Prepare los alimentos de un modo seguro: Lvese las manos despus de manipular carnes crudas. Donde prepare alimentos, mantenga las superficies limpias lavndolas regularmente con agua caliente y [place]. Mantenga las carnes crudas separadas de los alimentos que estn listos para comer como las frutas y las verduras. Cocine los frutos  de mar, carnes, aves y Loss adjuster, chartered la temperatura recomendada. Consiga un termmetro para alimentos. Almacene los alimentos a temperaturas seguras. En general: Mantenga los alimentos fros a una temperatura de 40 F (4,4 C) o inferior. Mantenga los alimentos calientes a una temperatura de 140 F (60 C) o superior. Mantenga el congelador a una temperatura de 0 F (-17,8 C) o inferior. Los alimentos no son seguros para su consumo cuando han estado a una temperatura de entre 40 y 140 F (4.4 y 60 C) por ms de 2 horas. Qu alimentos debo comer? Frutas Propngase comer entre 1 y 2 tazas de frutas frescas, Primary school teacher (en su jugo natural) o Primary school teacher. Una taza de fruta equivale a 1 manzana pequea, 1 banana grande, 8 fresas grandes, 1 taza (237 g) de fruta enlatada,  taza (82 g) de fruta seca o 1 taza (240 ml) de jugo al 100 %. Verduras Propngase comer de 2 a 4 tazas de verduras frescas y Primary school teacher, incluyendo diferentes variedades y colores. Una taza de verduras equivale a 1 taza (91 g) de brcoli o coliflor, 2 zanahorias medianas, 2 tazas (150 g) de verduras de [employer] crudas, 1 tomate grande, 1 pimiento morrn grande, 1 batata grande o 1 patata blanca mediana. Cereales Propngase comer el equivalente a entre 4 y 10 onzas de cereales integrales por Futures trader. Algunos ejemplos de equivalentes a 1 onza de cereales son 1 rebanada de pan, 1 taza (40 g) de cereal listo para comer, 3 tazas (24 g) de palomitas de maz o  taza (93 g) de arroz cocido. Carnes y otras protenas Propngase comer el equivalente a entre 5 y 7  onzas de protena por Futures trader. Algunos ejemplos de equivalentes a 1 onza de protenas incluyen 1 huevo,  oz de frutos secos (12 almendras, 24 pistachos o 7 mitades de nueces), 1/4 taza (90 g) de frijoles cocidos, 6 cucharadas (90 g) de hummus o 1 cucharada (16 g) de [place] de man. Un corte de carne o pescado del tamao de un mazo de cartas equivale aproximadamente a 3 a 4 onzas (85  g). De las protenas que consume cada semana, intente que al menos 8 onzas (227 g) sean frutos de mar. Esto equivale a unas 2 porciones por semana. Esto incluye salmn, trucha, arenque y anchoas. Lcteos [employer] a 3 tazas de lcteos descremados o con bajo contenido de Museum/gallery curator. Algunos ejemplos de equivalentes a 1 taza de lcteos son 1 taza (240 ml) de leche, 8 onzas (250 g) de yogur, 1 onzas (44 g) de queso natural o 1 taza (240 ml) de leche de soja fortificada. Grasas y aceites Propngase consumir alrededor de 5 cucharaditas (21 g) de grasas y Acupuncturist. Elija grasas monoinsaturadas, como el aceite de canola y de [place], la [place] con aceite de [place] o de [place], [place], [place] de man y Games developer de los frutos secos, o bien grasas poliinsaturadas, como el aceite de [place], maz y soja, nueces, piones, semillas de ssamo, semillas de girasol y semillas de lino. Bebidas Propngase beber 6 vasos de 8 onzas de Warehouse manager. Limite el caf a entre 3 y 5 tazas de ocho onzas por Futures trader. Limite el consumo de bebidas con cafena que tengan caloras agregadas, como los refrescos y las bebidas energizantes. Si bebe alcohol: Limite la cantidad que bebe a lo siguiente: De 0 a 1 medida al da si es [place]. De 0 a 2 medidas  al da si es varn. Sepa cunta cantidad de alcohol hay en las bebidas que toma. En los [address], una medida es una botella de cerveza de 12 oz (355 ml), un vaso de vino de 5 oz (148 ml) o un vaso de una bebida alcohlica de alta graduacin de 1 oz (44 ml). Condimentos y otros alimentos Trate de no agregar demasiada sal a los alimentos. Trate de usar hierbas y especias en lugar de sal. Trate de no agregar azcar a los alimentos. Esta informacin se basa en las pautas de nutricin de los [place]. Para obtener ms informacin, visite [email or web address]. Las Information systems manager. Es posible que necesite cantidades  diferentes. Esta informacin no tiene Theme park manager el consejo del mdico. Asegrese de hacerle al mdico cualquier pregunta que tenga. Document Revised: 11/02/2021 Document Reviewed: 11/02/2021 Elsevier Patient Education  2024 [employer].

## 2023-07-27 ENCOUNTER — Ambulatory Visit: Payer: Self-pay | Admitting: Rehabilitation

## 2023-07-27 ENCOUNTER — Encounter: Payer: Self-pay | Admitting: Rehabilitation

## 2023-07-27 DIAGNOSIS — Z483 Aftercare following surgery for neoplasm: Secondary | ICD-10-CM

## 2023-07-27 DIAGNOSIS — M25611 Stiffness of right shoulder, not elsewhere classified: Secondary | ICD-10-CM

## 2023-07-27 DIAGNOSIS — D0511 Intraductal carcinoma in situ of right breast: Secondary | ICD-10-CM

## 2023-07-27 NOTE — Therapy (Signed)
 OUTPATIENT PHYSICAL THERAPY  UPPER EXTREMITY ONCOLOGY TREATMENT  Patient Name: [name] [name] MRN: [id number] DOB:12/09/1960, 63 y.o., female Today's Date: 07/27/2023  END OF SESSION:  PT End of Session - 07/27/23 1455     Visit Number 2    Number of Visits 2    Date for PT Re-Evaluation 08/17/23    PT Start Time 1500    PT Stop Time 1536    PT Time Calculation (min) 36 min    Activity Tolerance Patient tolerated treatment well    Behavior During Therapy WFL for tasks assessed/performed          Past Medical History:  Diagnosis Date   Bell's palsy    Diabetes mellitus without complication (HCC)    Hyperlipidemia    Medical history non-contributory    Past Surgical History:  Procedure Laterality Date   BREAST BIOPSY Right 05/12/2023   MM RT BREAST BX W LOC DEV EA AD LESION IMG BX SPEC STEREO GUIDE 05/12/2023 [facility]   BREAST BIOPSY Right 05/12/2023   MM RT BREAST BX W LOC DEV 1ST LESION IMAGE BX SPEC STEREO GUIDE 05/12/2023 [facility]   TOTAL MASTECTOMY Right 06/20/2023   Procedure: MASTECTOMY, SIMPLE;  Surgeon: Ebbie Cough, MD;  Location: [facility] OR;  Service: General;  Laterality: Right;  RIGHT MASTECTOMY GEN w/PEC BLOCK   TUBAL LIGATION  1995   Patient Active Problem List   Diagnosis Date Noted   S/P mastectomy, right 06/20/2023   Ductal carcinoma in situ (DCIS) of right breast 05/22/2023   Hyperlipidemia associated with type 2 diabetes mellitus (HCC) 06/06/2019   Screening breast examination 09/27/2018   Obesity (BMI 30-39.9) 09/29/2016   NAFL (nonalcoholic fatty liver) 06/16/2016   Irritant contact dermatitis due to other chemical products 05/20/2016   Uncontrolled type 2 diabetes mellitus 10/21/2014   Hyperlipidemia 11/04/2013   Environmental allergies 07/12/2013   Bell's palsy 11/06/2012   GERD 11/04/2005    PCP: Barnie Louder, MD  REFERRING PROVIDER: Cough Ebbie, MD  REFERRING DIAG: Rt breast cancer post mastectomy    THERAPY DIAG:  Ductal carcinoma in situ (DCIS) of right breast  Aftercare following surgery for neoplasm  Shoulder stiffness, right  ONSET DATE: 06/20/23  Rationale for Evaluation and Treatment: Rehabilitation  SUBJECTIVE:                                                                                                                                                                                           SUBJECTIVE STATEMENT: I am feeling better. Back to work on the 16th.  It isn't hurting to reach anymore.   PERTINENT HISTORY: Rt mastectomy 06/20/23 due  to DCIS. No radiation needed.   PAIN:  Are you having pain? Yes, only when I touch.   NPRS scale: 1/10 Pain location: Rt chest and under the arm  Pain orientation: Right  PAIN TYPE: burning and sharp Pain description: intermittent  Aggravating factors: raise my arm and touch it  Relieving factors: nothing    PRECAUTIONS: None  RED FLAGS: None   WEIGHT BEARING RESTRICTIONS: No  FALLS:  Has patient fallen in last 6 months? No  LIVING ENVIRONMENT: Lives with: husband, child and her family - lisette   OCCUPATION: not working x 2 more weeks.  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  Works 9 hours per day. I can go back part time.    HAND DOMINANCE: right   PRIOR LEVEL OF FUNCTION: Independent  PATIENT GOALS: get the motion back in the arm    OBJECTIVE: Note: Objective measures were completed at Evaluation unless otherwise noted.  COGNITION: Overall cognitive status: Within functional limits for tasks assessed   PALPATION: No edema noted  OBSERVATIONS / OTHER ASSESSMENTS: incision is well healed with some glue and scabbing lateral edge.  Long incision overall.   POSTURE: a bit guarded   UPPER EXTREMITY AROM/PROM:  A/PROM RIGHT   eval  07/27/23  Shoulder extension 30 45  Shoulder flexion 125 - tight 140  Shoulder abduction 145 - tight 153  Shoulder internal rotation 30   Shoulder  external rotation 80 85    (Blank rows = not tested)  A/PROM LEFT   eval  Shoulder extension 45  Shoulder flexion 150  Shoulder abduction 145  Shoulder internal rotation 45  Shoulder external rotation 92    (Blank rows = not tested)   QUICK DASH SURVEY: 45%                                                                                                                            TREATMENT DATE:  07/27/23 Rechecked ROM Pulleys into flexion and abduction x each PROM of the Rt shoulder into flexion, abduction, and ER to tolerance - not many limitations  Reviewed HEP  Supine dowel flexion x 10 Behind the head chest stretch 3x20 Red band row x 10 bil ER with initial cueing.    07/20/23 Eval performed Education on post op exercises to start switching to flexion AAROM and chest stretch to supine with performance of each with cueing.  Pt with hesitation about incision opening so PT performed PROM in all directions watching incision and there was no opening or no fragile locations noted.  Pt feeling better about movement.  Pulleys into flexion and abduction showing then what to order  Discussed scar massage and bras Discussed POC with daughter and pt.  Pt will try stretches at home x 1 week and return for re-check and call if it is not needed.    PATIENT EDUCATION:  Education details: per today's note Person  educated: Patient and Child(ren) Education method: Explanation, Demonstration, Tactile cues, Verbal cues, and Handouts Education comprehension: verbalized understanding and returned demonstration  HOME EXERCISE PROGRAM: Post op, supine chest stretch and flexion version  ASSESSMENT:  CLINICAL IMPRESSION: Pt is now doing much better.  She has improved her AROM with minimal pain.  Her flexion is only limited by 10deg.  She now feels ready for work.   OBJECTIVE IMPAIRMENTS: decreased knowledge of condition, decreased ROM, increased fascial restrictions, and pain.   ACTIVITY  LIMITATIONS: carrying and lifting  PARTICIPATION LIMITATIONS: meal prep, community activity, and occupation  PERSONAL FACTORS: none are also affecting patient's functional outcome.   REHAB POTENTIAL: Excellent  CLINICAL DECISION MAKING: Stable/uncomplicated  EVALUATION COMPLEXITY: Low  GOALS: Goals reviewed with patient? No  SHORT TERM GOALS: Target date: 07/20/23  Pt will be educated on stretches to start for improved mobility  Baseline: Goal status: MET  2.  Pt will be educated on scar massage and care Baseline:  Goal status: MET   LONG TERM GOALS: Target date: 08/17/23  Pt will improve Rt shoulder AROM to at least the left  Baseline:  Goal status: MOSTLY MET   PLAN:  PT FREQUENCY: 1x/week  PT DURATION: 4 weeks  PLANNED INTERVENTIONS: 97164- PT Re-evaluation, 97110-Therapeutic exercises, 97530- Therapeutic activity, 97535- Self Care, [id number]- Manual therapy, Patient/Family education, Balance training, Joint mobilization, Therapeutic exercises, Therapeutic activity, Neuromuscular re-education, Gait training, and Self Care  PLAN FOR NEXT SESSION: recheck AROM  Aayan Haskew, Saddie SAUNDERS, PT 07/27/2023, 3:43 PM  PHYSICAL THERAPY DISCHARGE SUMMARY  Visits from Start of Care: 2  Current functional level related to goals / functional outcomes: See above   Remaining deficits: Slight chest tightness    Education / Equipment: Final stretches  Plan: Patient agrees to discharge.   Patient is being discharged due to meeting the stated rehab goals.

## 2023-08-07 ENCOUNTER — Other Ambulatory Visit: Payer: Self-pay

## 2023-08-09 ENCOUNTER — Other Ambulatory Visit: Payer: Self-pay

## 2023-08-14 ENCOUNTER — Other Ambulatory Visit (INDEPENDENT_AMBULATORY_CARE_PROVIDER_SITE_OTHER): Payer: Self-pay

## 2023-08-14 ENCOUNTER — Ambulatory Visit (INDEPENDENT_AMBULATORY_CARE_PROVIDER_SITE_OTHER): Admitting: Orthopedic Surgery

## 2023-08-14 DIAGNOSIS — M25532 Pain in left wrist: Secondary | ICD-10-CM

## 2023-08-14 DIAGNOSIS — M654 Radial styloid tenosynovitis [de Quervain]: Secondary | ICD-10-CM

## 2023-08-15 ENCOUNTER — Encounter: Payer: Self-pay | Admitting: Orthopedic Surgery

## 2023-08-15 DIAGNOSIS — M654 Radial styloid tenosynovitis [de Quervain]: Secondary | ICD-10-CM

## 2023-08-15 MED ORDER — METHYLPREDNISOLONE ACETATE 40 MG/ML IJ SUSP
40.0000 mg | INTRAMUSCULAR | Status: AC | PRN
  Administered 2023-08-15: 40 mg

## 2023-08-15 MED ORDER — LIDOCAINE HCL 1 % IJ SOLN
1.0000 mL | INTRAMUSCULAR | Status: AC | PRN
  Administered 2023-08-15: 1 mL

## 2023-08-15 NOTE — Progress Notes (Signed)
 Office Visit Note   Patient: [name] [name]           Date of Birth: 14-Apr-1960           MRN: [id number] Visit Date: 08/14/2023              Requested by: Vicci Barnie NOVAK, MD [address] [place],  [place] [address] PCP: Vicci Barnie NOVAK, MD  Chief Complaint  Patient presents with  . Left Wrist - Pain      HPI: Patient is a 63 year old woman who had a twisting injury to her left wrist.  She has pain over the first dorsal extensor compartment.  This has been present for several months.  Assessment & Plan: Visit Diagnoses:  1. Pain in left wrist   2. De Quervain's disease (tenosynovitis)     Plan: Plan for injection of the first dorsal extensor compartment.  Follow-up as needed.  Follow-Up Instructions: Return if symptoms worsen or fail to improve.   Ortho Exam  Patient is alert, oriented, no adenopathy, well-dressed, normal affect, normal respiratory effort. Examination the scaphoid scapholunate and TFCC are not tender to palpation.  She has good extension of all fingers no triggering of her fingers.  She is point tender to palpation over the first dorsal extensor compartment and a Finkelstein's test is positive.    Imaging: XR Wrist Complete Left Result Date: 08/15/2023 Three-view radiographs of the left wrist shows no scapholunate widening.  There are no fractures.  Joint spaces are congruent.  No images are attached to the encounter.  Labs: Lab Results  Component Value Date   HGBA1C 5.8 (H) 06/07/2023   HGBA1C 6.3 03/23/2023   HGBA1C 6.4 11/22/2022     Lab Results  Component Value Date   ALBUMIN 4.7 05/24/2023   ALBUMIN 4.5 03/23/2023   ALBUMIN 4.5 03/01/2022    No results found for: MG Lab Results  Component Value Date   VD25OH 24 (L) 07/12/2013    No results found for: PREALBUMIN    Latest Ref Rng & Units 05/24/2023   12:15 PM 03/23/2023   10:07 AM 03/01/2022   11:27 AM  CBC EXTENDED  WBC 4.0 - 10.5 K/uL 6.0  4.7   4.8   RBC 3.87 - 5.11 MIL/uL 4.61  4.61  4.79   Hemoglobin 12.0 - 15.0 g/dL 86.4  86.3  85.9   HCT 36.0 - 46.0 % 39.7  41.3  41.8   Platelets 150 - 400 K/uL 268  258  249   NEUT# 1.7 - 7.7 K/uL 3.9     Lymph# 0.7 - 4.0 K/uL 1.5        There is no height or weight on file to calculate BMI.  Orders:  Orders Placed This Encounter  Procedures  . XR Wrist Complete Left   No orders of the defined types were placed in this encounter.    Procedures: Hand/UE Inj: L extensor compartment 1 for de Quervain's tenosynovitis on 08/15/2023 2:00 PM Indications: therapeutic and diagnostic Details: 22 G needle, dorsal approach Medications: 1 mL lidocaine  1 %; 40 mg methylPREDNISolone  acetate 40 MG/ML Outcome: tolerated well, no immediate complications Procedure, treatment alternatives, risks and benefits explained, specific risks discussed. Consent was given by the patient. Immediately prior to procedure a time out was called to verify the correct patient, procedure, equipment, support staff and site/side marked as required. Patient was prepped and draped in the usual sterile fashion.     Clinical Data: No additional  findings.  ROS:  All other systems negative, except as noted in the HPI. Review of Systems  Objective: Vital Signs: There were no vitals taken for this visit.  Specialty Comments:  No specialty comments available.  PMFS History: Patient Active Problem List   Diagnosis Date Noted  . S/P mastectomy, right 06/20/2023  . 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 Hyperlipidemia associated with type 2 diabetes mellitus (HCC) 06/06/2019  . Screening breast examination 09/27/2018  . Obesity (BMI 30-39.9) 09/29/2016  . NAFL (nonalcoholic fatty liver) 06/16/2016  . Irritant contact dermatitis due to other chemical products 05/20/2016  . Uncontrolled type 2 diabetes mellitus 10/21/2014  . Hyperlipidemia 11/04/2013  . Environmental allergies 07/12/2013  .  Bell's palsy 11/06/2012  . GERD 11/04/2005   Past Medical History:  Diagnosis Date  . Bell's palsy   . Diabetes mellitus without complication (HCC)   . Hyperlipidemia   . Medical history non-contributory     Family History  Problem Relation Age of Onset  . Hypertension Mother   . Diabetes Mother   . Diabetes Father   . Diabetes Sister   . Diabetes Sister   . Diabetes Sister   . Diabetes Sister   . Diabetes Brother   . Colon cancer Neg Hx   . Breast cancer Neg Hx     Past Surgical History:  Procedure Laterality Date  . BREAST BIOPSY Right 05/12/2023   MM RT BREAST BX W LOC DEV EA AD LESION IMG BX SPEC STEREO GUIDE 05/12/2023 [facility]  . BREAST BIOPSY Right 05/12/2023   MM RT BREAST BX W LOC DEV 1ST LESION IMAGE BX SPEC STEREO GUIDE 05/12/2023 [facility]  . TOTAL MASTECTOMY Right 06/20/2023   Procedure: MASTECTOMY, SIMPLE;  Surgeon: Ebbie Cough, MD;  Location: [facility] OR;  Service: General;  Laterality: Right;  RIGHT MASTECTOMY GEN w/PEC BLOCK  . TUBAL LIGATION  1995   Social History   Occupational History  . Not on file  Tobacco Use  . Smoking status: Never  . Smokeless tobacco: Never  Vaping Use  . Vaping status: Never Used  Substance and Sexual Activity  . Alcohol use: No  . Drug use: No  . Sexual activity: Yes    Birth control/protection: None, Surgical, Post-menopausal

## 2023-09-08 ENCOUNTER — Other Ambulatory Visit: Payer: Self-pay

## 2023-09-08 ENCOUNTER — Other Ambulatory Visit: Payer: Self-pay | Admitting: Family Medicine

## 2023-09-08 ENCOUNTER — Other Ambulatory Visit (HOSPITAL_COMMUNITY): Payer: Self-pay

## 2023-09-08 DIAGNOSIS — E1169 Type 2 diabetes mellitus with other specified complication: Secondary | ICD-10-CM

## 2023-09-08 DIAGNOSIS — K219 Gastro-esophageal reflux disease without esophagitis: Secondary | ICD-10-CM

## 2023-09-08 MED ORDER — METHOCARBAMOL 500 MG PO TABS
500.0000 mg | ORAL_TABLET | Freq: Three times a day (TID) | ORAL | 2 refills | Status: AC | PRN
  Filled 2023-09-08 (×2): qty 30, 10d supply, fill #0

## 2023-09-11 ENCOUNTER — Other Ambulatory Visit: Payer: Self-pay

## 2023-09-11 MED ORDER — ATORVASTATIN CALCIUM 40 MG PO TABS
40.0000 mg | ORAL_TABLET | Freq: Every day | ORAL | 1 refills | Status: AC
  Filled 2023-09-11 – 2023-11-24 (×2): qty 90, 90d supply, fill #0

## 2023-09-11 MED ORDER — OMEPRAZOLE 20 MG PO CPDR
20.0000 mg | DELAYED_RELEASE_CAPSULE | Freq: Every day | ORAL | 1 refills | Status: AC
  Filled 2023-09-11: qty 90, fill #0

## 2023-09-11 NOTE — Telephone Encounter (Signed)
 Requested Prescriptions  Pending Prescriptions Disp Refills   atorvastatin  (LIPITOR) 40 MG tablet 90 tablet 1    Sig: Take 1 tablet (40 mg total) by mouth daily.     Cardiovascular:  Antilipid - Statins Failed - 09/11/2023 11:31 AM      Failed - Lipid Panel in normal range within the last 12 months    Cholesterol, Total  Date Value Ref Range Status  03/23/2023 138 100 - 199 mg/dL Final   LDL Chol Calc (NIH)  Date Value Ref Range Status  03/23/2023 68 0 - 99 mg/dL Final   HDL  Date Value Ref Range Status  03/23/2023 48 >39 mg/dL Final   Triglycerides  Date Value Ref Range Status  03/23/2023 126 0 - 149 mg/dL Final         Passed - Patient is not pregnant      Passed - Valid encounter within last 12 months    Recent Outpatient Visits           1 month ago Type 2 diabetes mellitus with obesity (HCC)   [place] [facility] [place]. [facility] Vicci Sober B, MD   5 months ago Type 2 diabetes mellitus with obesity [facility])   [place] [facility] [place]. [facility] Vicci Sober B, MD   9 months ago Type 2 diabetes mellitus with obesity [facility])   [place] [facility] Dept Of [place]. [facility] Vicci Sober NOVAK, MD   1 year ago Type 2 diabetes mellitus with obesity [facility])   [place] [facility] Dept Of [place]. [facility] [place], [place], [place]   1 year ago Type 2 diabetes mellitus with obesity [facility])   [place] [facility] - [facility] Dept Of [place]. [facility] Vicci Sober NOVAK, MD       Future Appointments             In 2 months Vicci Sober NOVAK, MD [facility] [place] - [facility] Dept Of [place]. [facility]             omeprazole  (PRILOSEC) 20 MG capsule 90 capsule 1    Sig: Take 1 capsule by mouth daily.     Gastroenterology: Proton Pump Inhibitors Passed - 09/11/2023 11:31 AM      Passed - Valid  encounter within last 12 months    Recent Outpatient Visits           1 month ago Type 2 diabetes mellitus with obesity (HCC)   [place] [facility] Dept Of [place]. [facility] Vicci Sober B, MD   5 months ago Type 2 diabetes mellitus with obesity [facility])   [place] [facility] Dept Of [place]. [facility] Vicci Sober B, MD   9 months ago Type 2 diabetes mellitus with obesity [facility])   [place] [facility] Dept Of [place]. [facility] Vicci Sober NOVAK, MD   1 year ago Type 2 diabetes mellitus with obesity [facility])   [place] [facility] - A Dept Of [place]. [facility] [place], [place], [place]   1 year ago Type 2 diabetes mellitus with obesity [facility])   [place] [facility] - [facility] Dept Of [place]. [facility] Vicci Barnie NOVAK, MD       Future Appointments             In 2 months Vicci Barnie NOVAK, MD [facility] [place] - A Dept Of [place]. [facility]

## 2023-10-10 ENCOUNTER — Encounter: Payer: Self-pay | Admitting: Adult Health

## 2023-10-10 ENCOUNTER — Other Ambulatory Visit: Payer: Self-pay

## 2023-10-10 ENCOUNTER — Other Ambulatory Visit: Payer: Self-pay | Admitting: *Deleted

## 2023-10-10 ENCOUNTER — Inpatient Hospital Stay: Payer: Self-pay | Attending: Adult Health | Admitting: Adult Health

## 2023-10-10 VITALS — BP 122/49 | HR 75 | Temp 98.3°F | Resp 18 | Wt 178.9 lb

## 2023-10-10 DIAGNOSIS — Z79810 Long term (current) use of selective estrogen receptor modulators (SERMs): Secondary | ICD-10-CM | POA: Insufficient documentation

## 2023-10-10 DIAGNOSIS — Z9011 Acquired absence of right breast and nipple: Secondary | ICD-10-CM | POA: Insufficient documentation

## 2023-10-10 DIAGNOSIS — D0511 Intraductal carcinoma in situ of right breast: Secondary | ICD-10-CM | POA: Insufficient documentation

## 2023-10-10 MED ORDER — TAMOXIFEN CITRATE 10 MG PO TABS
10.0000 mg | ORAL_TABLET | Freq: Every day | ORAL | 3 refills | Status: AC
  Filled 2023-10-10: qty 90, 90d supply, fill #0
  Filled 2024-02-21: qty 90, 90d supply, fill #1

## 2023-10-10 NOTE — Progress Notes (Signed)
 SURVIVORSHIP VISIT:  BRIEF ONCOLOGIC HISTORY:  Oncology History  Ductal carcinoma in situ (DCIS) of right breast  05/12/2023 Initial Diagnosis   Screening mammogram detected right breast calcification extending from nipple towards the back coarse heterogeneous calcifications measuring 7 cm.  Anterior and posterior biopsies revealed low-grade intermediate grade DCIS ER 100%, PR 20%   05/24/2023 Cancer Staging   Staging form: Breast, AJCC 8th Edition - Clinical: Stage 0 (cTis (DCIS), cN0, cM0, ER+, PR+, HER2: Not Assessed) - Signed by Odean Potts, MD on 05/24/2023 Stage prefix: Initial diagnosis Nuclear grade: G2   06/2023 -  Anti-estrogen oral therapy   Tamoxifen  x 5 years     INTERVAL HISTORY:  Discussed the use of AI scribe software for clinical note transcription with the patient, who gave verbal consent to proceed.  History of Present Illness [name] [name] is a 63 year old female with ductal carcinoma in situ who presents for follow-up after mastectomy and ongoing tamoxifen  therapy.  She takes tamoxifen , 10 mg at night, to minimize side effects and feels better with this regimen. She underwent a mastectomy for ductal carcinoma in situ, which was confined to the breast duct without invasion into breast tissue. Post-surgery, she feels well. She is aware of potential side effects of tamoxifen  but does not report experiencing any at this time.    REVIEW OF SYSTEMS:  Review of Systems  Constitutional:  Negative for appetite change, chills, fatigue, fever and unexpected weight change.  HENT:   Negative for hearing loss, lump/mass and trouble swallowing.   Eyes:  Negative for eye problems and icterus.  Respiratory:  Negative for chest tightness, cough and shortness of breath.   Cardiovascular:  Negative for chest pain, leg swelling and palpitations.  Gastrointestinal:  Negative for abdominal distention, abdominal pain, constipation, diarrhea, nausea and vomiting.  Endocrine:  Negative for hot flashes.  Genitourinary:  Negative for difficulty urinating.   Musculoskeletal:  Negative for arthralgias.  Skin:  Negative for itching and rash.  Neurological:  Negative for dizziness, extremity weakness, headaches and numbness.  Hematological:  Negative for adenopathy. Does not bruise/bleed easily.  Psychiatric/Behavioral:  Negative for depression. The patient is not nervous/anxious.    Breast: Denies any new nodularity, masses, tenderness, nipple changes, or nipple discharge.       PAST MEDICAL/SURGICAL HISTORY:  Past Medical History:  Diagnosis Date  . Bell's palsy   . Diabetes mellitus without complication (HCC)   . Hyperlipidemia   . Medical history non-contributory    Past Surgical History:  Procedure Laterality Date  . BREAST BIOPSY Right 05/12/2023   MM RT BREAST BX W LOC DEV EA AD LESION IMG BX SPEC STEREO GUIDE 05/12/2023 [facility]  . BREAST BIOPSY Right 05/12/2023   MM RT BREAST BX W LOC DEV 1ST LESION IMAGE BX SPEC STEREO GUIDE 05/12/2023 [facility]  . TOTAL MASTECTOMY Right 06/20/2023   Procedure: MASTECTOMY, SIMPLE;  Surgeon: Ebbie Cough, MD;  Location: [facility] OR;  Service: General;  Laterality: Right;  RIGHT MASTECTOMY GEN w/PEC BLOCK  . TUBAL LIGATION  1995     ALLERGIES:  Allergies  Allergen Reactions  . Ibuprofen      CURRENT MEDICATIONS:  Outpatient Encounter Medications as of 10/10/2023  Medication Sig  . aspirin  325 MG tablet Take 1 tablet (325 mg total) by mouth daily. (Patient not taking: Reported on 06/07/2023)  . atorvastatin  (LIPITOR) 40 MG tablet Take 1 tablet (40 mg total) by mouth daily.  . Blood Glucose Monitoring Suppl (TRUE METRIX METER)  w/Device KIT Use as directed  . cetirizine  (ZYRTEC ) 10 MG tablet Take 1 tablet (10 mg total) by mouth 2 (two) times daily. (Patient taking differently: Take 10 mg by mouth daily as needed for allergies.)  . diclofenac  Sodium (VOLTAREN ) 1 % GEL Apply 2 grams topically 4  (four) times daily.  SABRA glucose blood (TRUE METRIX BLOOD GLUCOSE TEST) test strip Use as instructed  . meloxicam  (MOBIC ) 7.5 MG tablet Take 1 tablet (7.5 mg total) by mouth daily.  . metFORMIN  (GLUCOPHAGE ) 1000 MG tablet Take 1 tablet (1,000 mg total) by mouth 2 (two) times daily with a meal.  . methocarbamol  (ROBAXIN ) 500 MG tablet Take 1 tablet (500 mg total) by mouth 3 (three) times daily.  . methocarbamol  (ROBAXIN ) 500 MG tablet Take 1 tablet (500 mg total) by mouth 3 (three) times daily as needed.  . Multiple Vitamin (MULTIVITAMIN WITH MINERALS) TABS tablet Take 1 tablet by mouth daily.  . omeprazole  (PRILOSEC) 20 MG capsule Take 1 capsule (20 mg total) by mouth daily.  . ondansetron  (ZOFRAN -ODT) 4 MG disintegrating tablet Dissolve 1 tablet (4 mg total) in mouth every 8 (eight) hours as needed for nausea for up to 7 days  . oxyCODONE  (OXY IR/ROXICODONE ) 5 MG immediate release tablet Take 1 tablet (5 mg total) by mouth every 4 (four) hours as needed for pain.  . [DISCONTINUED] sitaGLIPtin  (JANUVIA ) 100 MG tablet Take 1 tablet (100 mg total) by mouth daily.  . [DISCONTINUED] tamoxifen  (NOLVADEX ) 10 MG tablet Take 1 tablet (10 mg total) by mouth daily.   No facility-administered encounter medications on file as of 10/10/2023.     ONCOLOGIC FAMILY HISTORY:  Family History  Problem Relation Age of Onset  . Hypertension Mother   . Diabetes Mother   . Diabetes Father   . Diabetes Sister   . Diabetes Sister   . Diabetes Sister   . Diabetes Sister   . Diabetes Brother   . Colon cancer Neg Hx   . Breast cancer Neg Hx      SOCIAL HISTORY:  Social History   Socioeconomic History  . Marital status: Married    Spouse name: Not on file  . Number of children: 3  . Years of education: Not on file  . Highest education level: 9th grade  Occupational History  . Not on file  Tobacco Use  . Smoking status: Never  . Smokeless tobacco: Never  Vaping Use  . Vaping status: Never Used   Substance and Sexual Activity  . Alcohol use: No  . Drug use: No  . Sexual activity: Yes    Birth control/protection: None, Surgical, Post-menopausal  Other Topics Concern  . Not on file  Social History Narrative  . Not on file   Social Drivers of Health   Financial Resource Strain: Low Risk  (03/23/2023)   Overall Financial Resource Strain (CARDIA)   . Difficulty of Paying Living Expenses: Not very hard  Food Insecurity: No Food Insecurity (06/20/2023)   Hunger Vital Sign   . Worried About Programme researcher, broadcasting/film/video in the Last Year: Never true   . Ran Out of Food in the Last Year: Never true  Transportation Needs: No Transportation Needs (06/20/2023)   PRAPARE - Transportation   . Lack of Transportation (Medical): No   . Lack of Transportation (Non-Medical): No  Physical Activity: Inactive (03/23/2023)   Exercise Vital Sign   . Days of Exercise per Week: 0 days   . Minutes of Exercise per Session:  0 min  Stress: No Stress Concern Present (03/23/2023)   [employer] of Occupational Health - Occupational Stress Questionnaire   . Feeling of Stress : Not at all  Social Connections: Socially Integrated (06/20/2023)   Social Connection and Isolation Panel   . Frequency of Communication with Friends and Family: Three times a week   . Frequency of Social Gatherings with Friends and Family: Three times a week   . Attends Religious Services: More than 4 times per year   . Active Member of Clubs or Organizations: Yes   . Attends Banker Meetings: More than 4 times per year   . Marital Status: Married  Catering manager Violence: Not At Risk (06/20/2023)   Humiliation, Afraid, Rape, and Kick questionnaire   . Fear of Current or Ex-Partner: No   . Emotionally Abused: No   . Physically Abused: No   . Sexually Abused: No     OBSERVATIONS/OBJECTIVE:  BP (!) 122/49 (BP Location: Left Arm, Patient Position: Sitting) Comment: 2nd BP  Pulse 75   Temp 98.3 F (36.8 C) (Temporal)   Resp  18   Wt 178 lb 14.4 oz (81.1 kg)   SpO2 97%   BMI 33.80 kg/m  GENERAL: Patient is a well appearing female in no acute distress HEENT:  Sclerae anicteric.  Oropharynx clear and moist. No ulcerations or evidence of oropharyngeal candidiasis. Neck is supple.  NODES:  No cervical, supraclavicular, or axillary lymphadenopathy palpated.  BREAST EXAM:  Right breast s/p mastectomy, no sign of local recurrence.   LUNGS:  Clear to auscultation bilaterally.  No wheezes or rhonchi. HEART:  Regular rate and rhythm. No murmur appreciated. ABDOMEN:  Soft, nontender.  Positive, normoactive bowel sounds. No organomegaly palpated. MSK:  No focal spinal tenderness to palpation. Full range of motion bilaterally in the upper extremities. EXTREMITIES:  No peripheral edema.   SKIN:  Clear with no obvious rashes or skin changes. No nail dyscrasia. NEURO:  Nonfocal. Well oriented.  Appropriate affect.   LABORATORY DATA:  None for this visit.  DIAGNOSTIC IMAGING:  None for this visit.      ASSESSMENT AND PLAN:  [name] [name] is a pleasant 63 y.o. 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  She presents to the Survivorship Clinic for our initial meeting and routine follow-up post-completion of treatment for breast cancer.    1. Stage 0 right breast cancer:  [name] [name] is continuing to recover from definitive treatment for breast cancer. She will follow-up with her medical oncologist, Dr.  PIERRETTE with history and physical exam per surveillance protocol.  She will continue her anti-estrogen therapy with ***. Thus far, she is tolerating the *** well, with minimal side effects. Her mammogram is due ***; orders placed today.   Today, a comprehensive survivorship care plan and treatment summary was reviewed with the patient today detailing her breast cancer  diagnosis, treatment course, potential late/long-term effects of treatment, appropriate follow-up care with recommendations for the future, and patient education resources.  A copy of this summary, along with a letter will be sent to the patient's primary care provider via mail/fax/In Basket message after today's visit.    #. Problem(s) at Visit______________  #. Bone health:  Given [name] [name]'s age/history of breast cancer and her current treatment regimen including anti-estrogen therapy with ***, she is at risk for bone demineralization.  Her last DEXA scan was ***,  which showed ***.  In the meantime, she was encouraged to increase her consumption of foods rich in calcium , as well as increase her weight-bearing activities.  She was given education on specific activities to promote bone health.  #. Cancer screening:  Due to [name] [name]'s history and her age, she should receive screening for skin cancers, colon cancer, and gynecologic cancers.  The information and recommendations are listed on the patient's comprehensive care plan/treatment summary and were reviewed in detail with the patient.    #. Health maintenance and wellness promotion: Ms. Lessig was encouraged to consume 5-7 servings of fruits and vegetables per day. We reviewed the Nutrition Rainbow handout.  She was also encouraged to engage in moderate to vigorous exercise for 30 minutes per day most days of the week.  She was instructed to limit her alcohol consumption and continue to abstain from tobacco use/***was encouraged stop smoking.     #. Support services/counseling: It is not uncommon for this period of the patient's cancer care trajectory to be one of many emotions and stressors.   She was given information regarding our available services and encouraged to contact me with any questions or for help enrolling in any of our support group/programs.    Follow up instructions:    -Return to  cancer center ***  -Mammogram due in *** -She is welcome to return back to the Survivorship Clinic at any time; no additional follow-up needed at this time.  -Consider referral back to survivorship as a long-term survivor for continued surveillance  The patient was provided an opportunity to ask questions and all were answered. The patient agreed with the plan and demonstrated an understanding of the instructions.   Total encounter time:*** minutes*in face-to-face visit time, chart review, lab review, care coordination, order entry, and documentation of the encounter time.    Morna Kendall, NP 10/10/23 3:07 PM Medical Oncology and Hematology [facility] [address] [place], [place] [address] Tel. [phone]    Fax. [phone]  *Total Encounter Time as defined by the Centers for Medicare and Medicaid Services includes, in addition to the face-to-face time of a patient visit (documented in the note above) non-face-to-face time: obtaining and reviewing outside history, ordering and reviewing medications, tests or procedures, care coordination (communications with other health care professionals or caregivers) and documentation in the medical record.

## 2023-10-11 ENCOUNTER — Other Ambulatory Visit: Payer: Self-pay

## 2023-10-13 ENCOUNTER — Encounter: Payer: Self-pay | Admitting: *Deleted

## 2023-10-13 NOTE — Progress Notes (Signed)
 SCP summary translation completed and mailed to the pt. RN Navigator completed this task.

## 2023-11-20 ENCOUNTER — Encounter: Payer: Self-pay | Admitting: Radiology

## 2023-11-24 ENCOUNTER — Encounter: Payer: Self-pay | Admitting: Internal Medicine

## 2023-11-24 ENCOUNTER — Other Ambulatory Visit: Payer: Self-pay

## 2023-11-24 ENCOUNTER — Ambulatory Visit: Payer: Self-pay | Attending: Internal Medicine | Admitting: Internal Medicine

## 2023-11-24 VITALS — BP 132/73 | HR 71 | Temp 97.6°F | Ht 61.0 in | Wt 180.0 lb

## 2023-11-24 DIAGNOSIS — Z7984 Long term (current) use of oral hypoglycemic drugs: Secondary | ICD-10-CM

## 2023-11-24 DIAGNOSIS — M25532 Pain in left wrist: Secondary | ICD-10-CM

## 2023-11-24 DIAGNOSIS — E785 Hyperlipidemia, unspecified: Secondary | ICD-10-CM

## 2023-11-24 DIAGNOSIS — E1169 Type 2 diabetes mellitus with other specified complication: Secondary | ICD-10-CM

## 2023-11-24 DIAGNOSIS — Z6834 Body mass index (BMI) 34.0-34.9, adult: Secondary | ICD-10-CM

## 2023-11-24 DIAGNOSIS — E669 Obesity, unspecified: Secondary | ICD-10-CM

## 2023-11-24 DIAGNOSIS — E119 Type 2 diabetes mellitus without complications: Secondary | ICD-10-CM

## 2023-11-24 DIAGNOSIS — R030 Elevated blood-pressure reading, without diagnosis of hypertension: Secondary | ICD-10-CM

## 2023-11-24 DIAGNOSIS — Z7985 Long-term (current) use of injectable non-insulin antidiabetic drugs: Secondary | ICD-10-CM

## 2023-11-24 LAB — POCT GLYCOSYLATED HEMOGLOBIN (HGB A1C): HbA1c, POC (controlled diabetic range): 6.5 % (ref 0.0–7.0)

## 2023-11-24 LAB — GLUCOSE, POCT (MANUAL RESULT ENTRY): POC Glucose: 146 mg/dL — AB (ref 70–99)

## 2023-11-24 MED ORDER — TRULICITY 0.75 MG/0.5ML ~~LOC~~ SOAJ
0.7500 mg | SUBCUTANEOUS | 5 refills | Status: AC
  Filled 2023-11-24: qty 2, 28d supply, fill #0
  Filled 2023-12-26: qty 2, 28d supply, fill #1
  Filled 2024-01-19 (×2): qty 2, 28d supply, fill #2
  Filled 2024-02-20 – 2024-02-21 (×2): qty 2, 28d supply, fill #3

## 2023-11-24 MED ORDER — METFORMIN HCL 1000 MG PO TABS
1000.0000 mg | ORAL_TABLET | Freq: Every day | ORAL | 3 refills | Status: AC
  Filled 2023-11-24: qty 90, 90d supply, fill #0
  Filled 2024-02-21: qty 90, 90d supply, fill #1

## 2023-11-24 NOTE — Progress Notes (Signed)
 Patient ID: [name] [name], female    DOB: 28-Apr-1960  MRN: [id number]  CC: Diabetes (DM f/u. Med refills. /)   Subjective: [name] [name] is a 63 y.o. female who presents for chronic ds management. 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 #[id number]  Discussed the use of AI scribe software for clinical note transcription with the patient, who gave verbal consent to proceed.  History of Present Illness [name] [name] is a 63 year old female with diabetes and [name] who presents for follow-up of her chronic medical conditions.  DM:  Results for orders placed or performed in visit on 11/24/23  POCT glucose (manual entry)   Collection Time: 11/24/23  8:48 AM  Result Value Ref Range   POC Glucose 146 (A) 70 - 99 mg/dl  POCT glycosylated hemoglobin (Hb A1C)   Collection Time: 11/24/23  8:51 AM  Result Value Ref Range   Hemoglobin A1C     HbA1c POC (<> result, manual entry)     HbA1c, POC (prediabetic range)     HbA1c, POC (controlled diabetic range) 6.5 0.0 - 7.0 %  On last visit she requested d/c Trulicity  due to discomfort with self-injection but now wishes to resume it as she felt it helped her eat less. Since discontinuing Trulicity , she has noticed an increase in appetite. Her current A1c is 6.5%, and her blood sugar readings are generally good, with a recent reading of 135 mg/dL. She checks her blood sugar daily, with values sometimes as low as 90 mg/dL. She is currently taking metformin  1000 mg twice daily. She engages in physical activity by walking three days a week for 30 minutes each session, on days she is not working. She works early shifts starting at 5 AM. She recently had a diabetic eye exam at the end of October, where she was informed of an issue requiring treatment. I request that she signs a release  today so we can get records but she does not recall the name of the place  HL: She is managing her [name] with atorvastatin  40 mg daily. No changes in her weight, which remains at 180 pounds, the same as four months ago.  She recently had a diabetic eye exam at the end of October, where she was informed of an issue requiring treatment.  She mentions a previous referral to a specialist for a hand issue, where she received an injection that initially helped but the pain has returned.  She saw her orthopedic specialist Dr. Harden in July for pain in the left wrist due to tenosynovitis.  She had an injection done that was helpful but pain has started to return.  HM: received  flu shot for free at an outside pharmacy on 11/01/2023. Had eye exam end of October. Told she has something in eye. He was suppose to send us  information  Patient Active Problem List   Diagnosis Date Noted   S/P mastectomy, right 06/20/2023   Ductal carcinoma in situ (DCIS) of right breast 05/22/2023   [name] associated with type 2 diabetes mellitus (HCC) 06/06/2019   Screening breast examination 09/27/2018   Obesity (BMI 30-39.9) 09/29/2016   NAFL (nonalcoholic fatty liver) 06/16/2016   Irritant contact dermatitis due to other chemical products 05/20/2016   Uncontrolled type 2 diabetes mellitus 10/21/2014   [name] 11/04/2013   Environmental allergies 07/12/2013   Bell's palsy  11/06/2012   GERD 11/04/2005     Current Outpatient Medications on File Prior to Visit  Medication Sig Dispense Refill   aspirin  325 MG tablet Take 1 tablet (325 mg total) by mouth daily. 30 tablet 0   atorvastatin  (LIPITOR) 40 MG tablet Take 1 tablet (40 mg total) by mouth daily. 90 tablet 1   Blood Glucose Monitoring Suppl (TRUE METRIX METER) w/Device KIT Use as directed 1 kit 0   cetirizine  (ZYRTEC ) 10 MG tablet Take 1 tablet (10 mg total) by mouth 2 (two) times daily. (Patient taking differently: Take 10 mg by mouth  daily as needed for allergies.) 60 tablet 5   diclofenac  Sodium (VOLTAREN ) 1 % GEL Apply 2 grams topically 4 (four) times daily. 100 g 1   glucose blood (TRUE METRIX BLOOD GLUCOSE TEST) test strip Use as instructed 100 each 3   meloxicam  (MOBIC ) 7.5 MG tablet Take 1 tablet (7.5 mg total) by mouth daily. 30 tablet 3   metFORMIN  (GLUCOPHAGE ) 1000 MG tablet Take 1 tablet (1,000 mg total) by mouth 2 (two) times daily with a meal. 180 tablet 3   methocarbamol  (ROBAXIN ) 500 MG tablet Take 1 tablet (500 mg total) by mouth 3 (three) times daily as needed. 30 tablet 2   Multiple Vitamin (MULTIVITAMIN WITH MINERALS) TABS tablet Take 1 tablet by mouth daily.     omeprazole  (PRILOSEC) 20 MG capsule Take 1 capsule (20 mg total) by mouth daily. 90 capsule 1   ondansetron  (ZOFRAN -ODT) 4 MG disintegrating tablet Dissolve 1 tablet (4 mg total) in mouth every 8 (eight) hours as needed for nausea for up to 7 days 20 tablet 0   oxyCODONE  (OXY IR/ROXICODONE ) 5 MG immediate release tablet Take 1 tablet (5 mg total) by mouth every 4 (four) hours as needed for pain. 10 tablet 0   tamoxifen  (NOLVADEX ) 10 MG tablet Take 1 tablet (10 mg total) by mouth daily. 90 tablet 3   methocarbamol  (ROBAXIN ) 500 MG tablet Take 1 tablet (500 mg total) by mouth 3 (three) times daily. (Patient not taking: Reported on 11/24/2023) 30 tablet 0   [DISCONTINUED] sitaGLIPtin  (JANUVIA ) 100 MG tablet Take 1 tablet (100 mg total) by mouth daily. 30 tablet 6   No current facility-administered medications on file prior to visit.    Allergies  Allergen Reactions   Ibuprofen     Social History   Socioeconomic History   Marital status: Married    Spouse name: Not on file   Number of children: 3   Years of education: Not on file   Highest education level: 9th grade  Occupational History   Not on file  Tobacco Use   Smoking status: Never   Smokeless tobacco: Never  Vaping Use   Vaping status: Never Used  Substance and Sexual Activity    Alcohol use: No   Drug use: No   Sexual activity: Yes    Birth control/protection: None, Surgical, Post-menopausal  Other Topics Concern   Not on file  Social History Narrative   Not on file   Social Drivers of Health   Financial Resource Strain: Low Risk  (03/23/2023)   Overall Financial Resource Strain (CARDIA)    Difficulty of Paying Living Expenses: Not very hard  Food Insecurity: No Food Insecurity (06/20/2023)   Hunger Vital Sign    Worried About Running Out of Food in the Last Year: Never true    Ran Out of Food in the Last Year: Never true  Transportation Needs: No Transportation  Needs (06/20/2023)   PRAPARE - Administrator, Civil Service (Medical): No    Lack of Transportation (Non-Medical): No  Physical Activity: Inactive (03/23/2023)   Exercise Vital Sign    Days of Exercise per Week: 0 days    Minutes of Exercise per Session: 0 min  Stress: No Stress Concern Present (03/23/2023)   [employer] of Occupational Health - Occupational Stress Questionnaire    Feeling of Stress : Not at all  Social Connections: Socially Integrated (06/20/2023)   Social Connection and Isolation Panel    Frequency of Communication with Friends and Family: Three times a week    Frequency of Social Gatherings with Friends and Family: Three times a week    Attends Religious Services: More than 4 times per year    Active Member of Clubs or Organizations: Yes    Attends Banker Meetings: More than 4 times per year    Marital Status: Married  Catering Manager Violence: Not At Risk (06/20/2023)   Humiliation, Afraid, Rape, and Kick questionnaire    Fear of Current or Ex-Partner: No    Emotionally Abused: No    Physically Abused: No    Sexually Abused: No    Family History  Problem Relation Age of Onset   Hypertension Mother    Diabetes Mother    Diabetes Father    Diabetes Sister    Diabetes Sister    Diabetes Sister    Diabetes Sister    Diabetes Brother    Colon  cancer Neg Hx    Breast cancer Neg Hx     Past Surgical History:  Procedure Laterality Date   BREAST BIOPSY Right 05/12/2023   MM RT BREAST BX W LOC DEV EA AD LESION IMG BX SPEC STEREO GUIDE 05/12/2023 [facility]   BREAST BIOPSY Right 05/12/2023   MM RT BREAST BX W LOC DEV 1ST LESION IMAGE BX SPEC STEREO GUIDE 05/12/2023 [facility]   TOTAL MASTECTOMY Right 06/20/2023   Procedure: MASTECTOMY, SIMPLE;  Surgeon: Ebbie Cough, MD;  Location: [facility];  Service: General;  Laterality: Right;  RIGHT MASTECTOMY GEN w/PEC BLOCK   TUBAL LIGATION  1995    ROS: Review of Systems Negative except as stated above  PHYSICAL EXAM: BP 131/79 (BP Location: Left Arm, Patient Position: Sitting, Cuff Size: Normal)   Pulse 71   Temp 97.6 F (36.4 C) (Oral)   Ht 5' 1 (1.549 m)   Wt 180 lb (81.6 kg)   SpO2 96%   BMI 34.01 kg/m   Wt Readings from Last 3 Encounters:  11/24/23 180 lb (81.6 kg)  10/10/23 178 lb 14.4 oz (81.1 kg)  07/24/23 180 lb (81.6 kg)    Physical Exam  General appearance - alert, well appearing, older Hispanic female and in no distress Mental status - normal mood, behavior, speech, dress, motor activity, and thought processes Neck - supple, no significant adenopathy Chest - clear to auscultation, no wheezes, rales or rhonchi, symmetric air entry Heart - normal rate, regular rhythm, normal S1, S2, no murmurs, rubs, clicks or gallops Extremities - peripheral pulses normal, no pedal edema, no clubbing or cyanosis Diabetic Foot Exam - Simple   Simple Foot Form Diabetic Foot exam was performed with the following findings: Yes 11/24/2023  9:07 AM  Visual Inspection No deformities, no ulcerations, no other skin breakdown bilaterally: Yes Sensation Testing Intact to touch and monofilament testing bilaterally: Yes Pulse Check Posterior Tibialis and Dorsalis pulse intact bilaterally: Yes Comments  Latest Ref Rng & Units 05/24/2023   12:15 PM 03/23/2023    10:07 AM 03/01/2022   11:27 AM  CMP  Glucose 70 - 99 mg/dL 843  864  891   BUN 8 - 23 mg/dL 13  10  8    Creatinine 0.44 - 1.00 mg/dL 9.29  9.40  9.40   Sodium 135 - 145 mmol/L 140  142  141   Potassium 3.5 - 5.1 mmol/L 4.0  4.2  4.2   Chloride 98 - 111 mmol/L 102  104  102   CO2 22 - 32 mmol/L 31  23  23    Calcium  8.9 - 10.3 mg/dL 9.9  9.7  9.5   Total Protein 6.5 - 8.1 g/dL 7.7  7.1  7.0   Total Bilirubin 0.0 - 1.2 mg/dL 1.1  1.0  0.9   Alkaline Phos 38 - 126 U/L 105  118  102   AST 15 - 41 U/L 26  22  19    ALT 0 - 44 U/L 32  21  20    Lipid Panel     Component Value Date/Time   CHOL 138 03/23/2023 1007   TRIG 126 03/23/2023 1007   HDL 48 03/23/2023 1007   CHOLHDL 2.9 03/23/2023 1007   CHOLHDL 4.0 06/04/2014 1055   VLDL 40 06/04/2014 1055   LDLCALC 68 03/23/2023 1007    CBC    Component Value Date/Time   WBC 6.0 05/24/2023 1215   WBC 6.2 07/12/2013 1253   RBC 4.61 05/24/2023 1215   HGB 13.5 05/24/2023 1215   HGB 13.6 03/23/2023 1007   HCT 39.7 05/24/2023 1215   HCT 41.3 03/23/2023 1007   PLT 268 05/24/2023 1215   PLT 258 03/23/2023 1007   MCV 86.1 05/24/2023 1215   MCV 90 03/23/2023 1007   MCH 29.3 05/24/2023 1215   MCHC 34.0 05/24/2023 1215   RDW 13.1 05/24/2023 1215   RDW 13.0 03/23/2023 1007   LYMPHSABS 1.5 05/24/2023 1215   MONOABS 0.4 05/24/2023 1215   EOSABS 0.2 05/24/2023 1215   BASOSABS 0.0 05/24/2023 1215    ASSESSMENT AND PLAN: 1. Type 2 diabetes mellitus in patient with obesity (HCC) (Primary) At goal. Weight has remained stable but patient reports her appetite has increased since being off Trulicity .  She would like to restart the medication.  I reminded her again of how the medication works and potential side effects including nausea/vomiting, pancreatitis, bowel blockage, severe diarrhea/constipation.  If she develops any vomiting, abdominal pain or severe diarrhea she should stop the medicine and come in.  We will restart her on Trulicity  0.75 mg  once a week.  After being on the Trulicity  for 2 weeks, she should decrease the metformin  to 1000 mg once a day. - POCT glucose (manual entry) - POCT glycosylated hemoglobin (Hb A1C) - Dulaglutide  (TRULICITY ) 0.75 MG/0.5ML SOAJ; Inject 0.75 mg into the skin once a week.  Dispense: 2 mL; Refill: 5 - Microalbumin / creatinine urine ratio  2. Diabetes mellitus treated with oral medication (HCC) 3. Long-term (current) use of injectable non-insulin  antidiabetic drugs See above  4. [name] associated with type 2 diabetes mellitus (HCC) Continue atorvastatin .  Last LDL in March was 68.  5. Elevated blood pressure reading in office without diagnosis of hypertension Systolic blood pressure elevated today.  DASH diet discussed and encouraged.  Will have her see clinical pharmacist in 2 months for recheck.  6. Pain in left wrist Reoccurrence of tenosynovitis.  Will get her back  in with orthopedics for another injection.  Patient was given the opportunity to ask questions.  Patient verbalized understanding of the plan and was able to repeat key elements of the plan.   This documentation was completed using Paediatric nurse.  Any transcriptional errors are unintentional.  Orders Placed This Encounter  Procedures   POCT glucose (manual entry)   POCT glycosylated hemoglobin (Hb A1C)     Requested Prescriptions    No prescriptions requested or ordered in this encounter    No follow-ups on file.  Barnie Louder, MD, FACP

## 2023-11-26 LAB — MICROALBUMIN / CREATININE URINE RATIO
Creatinine, Urine: 26.4 mg/dL
Microalb/Creat Ratio: <11 mg/g{creat} (ref 0–29)
Microalbumin, Urine: <3 ug/mL

## 2023-11-27 ENCOUNTER — Ambulatory Visit: Payer: Self-pay | Admitting: Internal Medicine

## 2023-11-30 ENCOUNTER — Other Ambulatory Visit: Payer: Self-pay

## 2023-12-20 ENCOUNTER — Other Ambulatory Visit: Payer: Self-pay

## 2023-12-26 ENCOUNTER — Other Ambulatory Visit: Payer: Self-pay

## 2024-01-19 ENCOUNTER — Encounter: Payer: Self-pay | Admitting: Pharmacist

## 2024-01-19 ENCOUNTER — Ambulatory Visit: Payer: Self-pay | Attending: Nurse Practitioner | Admitting: Pharmacist

## 2024-01-19 ENCOUNTER — Other Ambulatory Visit: Payer: Self-pay

## 2024-01-19 VITALS — BP 118/73 | HR 79

## 2024-01-19 DIAGNOSIS — R030 Elevated blood-pressure reading, without diagnosis of hypertension: Secondary | ICD-10-CM

## 2024-01-19 NOTE — Progress Notes (Signed)
" ° ° °  S:    PCP: Dr. Vicci   Patient arrives in good spirits. Presents for HTN evaluation, education, and management. Patient was referred and last seen by her PCP on 11/24/2023. BP at that visit was 132/73 mmHg.   PMH is significant for: T2DM, hyperlipidemia, obesity, MASLD, GERD.  At her visit with Dr. Vicci in November, her BP was elevated but she does not have a hx of hypertension. She was referred back to me today for a BP check.   Today, patient reports doing well. Denies any chest pain, dyspnea, HA, or blurred vision. She does not take any antihypertensives currently. Of note, even with her hx of DM, she does not have any objective evidence for diabetic nephropathy at this time. uACR 11/24/23 was normal and this has been the case over the last 6 years since we've been screening.   Family/Social History:   FHx: DM  Tobacco: never smoker   Alcohol: denies use   Insurance coverage/medication affordability: dnbi   Current hypertension medications include: none   Patient-reported home BP readings: none  Patient reported dietary habits:  - compliant with sodium restriction  - denies excessive intake of caffeine   Patient-reported exercise habits:  - walks every day  O:  Lab Results  Component Value Date   HGBA1C 6.5 11/24/2023   Vitals:   01/19/24 1527  BP: 118/73  Pulse: 79   Lipid Panel     Component Value Date/Time   CHOL 138 03/23/2023 1007   TRIG 126 03/23/2023 1007   HDL 48 03/23/2023 1007   CHOLHDL 2.9 03/23/2023 1007   CHOLHDL 4.0 06/04/2014 1055   VLDL 40 06/04/2014 1055   LDLCALC 68 03/23/2023 1007   Clinical Atherosclerotic Cardiovascular Disease (ASCVD): No  The 10-year ASCVD risk score (Arnett DK, et al., 2019) is: 5.6%   Values used to calculate the score:     Age: 64 years     Clinically relevant sex: Female     Is Non-Hispanic African American: No     Diabetic: Yes     Tobacco smoker: No     Systolic Blood Pressure: 118 mmHg     Is BP  treated: No     HDL Cholesterol: 48 mg/dL     Total Cholesterol: 138 mg/dL   A/P: Hypertension undiagnosed. Pt is currently normotensive off of medication. BP goal < 120/80 mmHg. No indication for antihypertensives at this time.  -Counseled on lifestyle modifications for blood pressure control including reduced dietary sodium, increased exercise, adequate sleep. -Encouraged patient to check BP at home and bring log of readings to next visit. Counseled on proper use of home BP cuff.   Results reviewed and written information provided.    Written patient instructions provided. Patient verbalized understanding of treatment plan.  Total time in face to face counseling 20 minutes.    Follow-up:  Pharmacist prn. PCP clinic visit: 03/29/24.   Herlene Fleeta [name], PharmD, JAQUELINE, CPP Clinical Pharmacist [facility] [phone]  "

## 2024-01-30 ENCOUNTER — Other Ambulatory Visit: Payer: Self-pay

## 2024-02-20 ENCOUNTER — Telehealth: Payer: Self-pay | Admitting: Hematology and Oncology

## 2024-02-20 NOTE — Telephone Encounter (Signed)
 I LEFT VOICEMAIL VIA INTERPRETING SERVICES REGARDING RESCHEDULED MD APPOINTMENT FROM 03/12/2024 TO 03/27/2024. A REMINDER HAS BEEN MAILED TO ADDRESS ON FILE.

## 2024-02-21 ENCOUNTER — Other Ambulatory Visit: Payer: Self-pay

## 2024-03-12 ENCOUNTER — Inpatient Hospital Stay: Payer: Self-pay | Admitting: Hematology and Oncology

## 2024-03-27 ENCOUNTER — Inpatient Hospital Stay: Payer: Self-pay | Admitting: Hematology and Oncology

## 2024-03-29 ENCOUNTER — Ambulatory Visit: Payer: Self-pay | Admitting: Internal Medicine
